# Patient Record
Sex: Female | Born: 1975 | Race: Black or African American | Hispanic: No | Smoking: Former smoker
Health system: Southern US, Community
[De-identification: ages and names within clinical notes are randomized; demographics above are authoritative.]

## PROBLEM LIST (undated history)

## (undated) DIAGNOSIS — Z8619 Personal history of other infectious and parasitic diseases: Secondary | ICD-10-CM

## (undated) DIAGNOSIS — F419 Anxiety disorder, unspecified: Secondary | ICD-10-CM

## (undated) DIAGNOSIS — Z720 Tobacco use: Secondary | ICD-10-CM

## (undated) DIAGNOSIS — M1990 Unspecified osteoarthritis, unspecified site: Secondary | ICD-10-CM

## (undated) DIAGNOSIS — G932 Benign intracranial hypertension: Secondary | ICD-10-CM

## (undated) DIAGNOSIS — R569 Unspecified convulsions: Secondary | ICD-10-CM

## (undated) HISTORY — DX: Morbid (severe) obesity due to excess calories: E66.01

## (undated) HISTORY — DX: Anxiety disorder, unspecified: F41.9

## (undated) HISTORY — PX: BREAST BIOPSY: SHX20

## (undated) HISTORY — DX: Benign intracranial hypertension: G93.2

## (undated) HISTORY — PX: OTHER SURGICAL HISTORY: SHX169

## (undated) HISTORY — DX: Unspecified osteoarthritis, unspecified site: M19.90

## (undated) HISTORY — PX: LUMBAR PUNCTURE: SHX1985

## (undated) HISTORY — DX: Tobacco use: Z72.0

## (undated) HISTORY — PX: CHOLECYSTECTOMY: SHX55

## (undated) HISTORY — DX: Personal history of other infectious and parasitic diseases: Z86.19

---

## 2009-05-31 ENCOUNTER — Emergency Department (HOSPITAL_COMMUNITY): Admission: EM | Admit: 2009-05-31 | Discharge: 2009-05-31 | Payer: Self-pay | Admitting: Emergency Medicine

## 2011-02-22 ENCOUNTER — Emergency Department (HOSPITAL_COMMUNITY)
Admission: EM | Admit: 2011-02-22 | Discharge: 2011-02-22 | Disposition: A | Discharge status: Home / Self Care | Payer: Self-pay | Attending: Emergency Medicine | Admitting: Emergency Medicine

## 2011-02-22 ENCOUNTER — Emergency Department (HOSPITAL_COMMUNITY): Payer: Self-pay

## 2011-02-22 DIAGNOSIS — M546 Pain in thoracic spine: Secondary | ICD-10-CM | POA: Insufficient documentation

## 2013-02-04 ENCOUNTER — Encounter (HOSPITAL_COMMUNITY): Payer: Self-pay | Admitting: Emergency Medicine

## 2013-02-04 ENCOUNTER — Emergency Department (HOSPITAL_COMMUNITY)
Admission: EM | Admit: 2013-02-04 | Discharge: 2013-02-04 | Disposition: A | Discharge status: Home / Self Care | Payer: BC Managed Care – PPO | Attending: Emergency Medicine | Admitting: Emergency Medicine

## 2013-02-04 DIAGNOSIS — R002 Palpitations: Secondary | ICD-10-CM | POA: Insufficient documentation

## 2013-02-04 DIAGNOSIS — F17200 Nicotine dependence, unspecified, uncomplicated: Secondary | ICD-10-CM | POA: Insufficient documentation

## 2013-02-04 LAB — BASIC METABOLIC PANEL
GFR calc Af Amer: >90 mL/min (ref >90)
GFR calc non Af Amer: 87 mL/min — ABNORMAL LOW (ref >90)
Potassium: 4.1 mEq/L (ref 3.5–5.1)
Sodium: 137 mEq/L (ref 135–145)

## 2013-02-04 LAB — CBC
Hemoglobin: 13.5 g/dL (ref 12.0–15.0)
MCHC: 34.7 g/dL (ref 30.0–36.0)
RDW: 12.5 % (ref 11.5–15.5)
WBC: 5.4 10*3/uL (ref 4.0–10.5)

## 2013-02-04 LAB — URINALYSIS, ROUTINE W REFLEX MICROSCOPIC
Leukocytes, UA: NEGATIVE
Nitrite: NEGATIVE
Specific Gravity, Urine: 1.017 (ref 1.005–1.030)
Urobilinogen, UA: 1 mg/dL (ref 0.0–1.0)

## 2013-02-04 LAB — URINE MICROSCOPIC-ADD ON

## 2013-02-04 NOTE — Progress Notes (Signed)
Patient confirms she does not have a pcp.  EDCM instructed patient to call the number on the back of her insurance card or go to insurance company website to help her find a physician who is close to her and within network.  Patient verbalized understanding and reports that she has been looking fro a pcp.  Patient thankful for information.

## 2013-02-04 NOTE — ED Notes (Signed)
Per pt has had "heart fluttering" over the past year that has became more fequent this past week. Pt reports SOB, h/a, and 1 episode of vomiting this am. Pt denies other symptoms at present time.

## 2013-02-04 NOTE — ED Provider Notes (Signed)
Medical screening examination/treatment/procedure(s) were performed by non-physician practitioner and as supervising physician I was immediately available for consultation/collaboration.   Shacara Cozine, MD 02/04/13 2300 

## 2013-02-04 NOTE — ED Provider Notes (Signed)
CSN: [id number]     Arrival date & time 02/04/13  1702 History   First MD Initiated Contact with Patient 02/04/13 1712     Chief Complaint  Patient presents with  . Palpitations   (Consider location/radiation/quality/duration/timing/severity/associated sxs/prior Treatment) Patient is a 37 y.o. female presenting with palpitations. The history is provided by the patient and medical records. No language interpreter was used.  Palpitations Associated symptoms: no back pain, no chest pain, no cough, no diaphoresis, no nausea, no shortness of breath and no vomiting     Kristina Coleman is a 37 y.o. female  with a hx of cholecystectomy presents to the Emergency Department complaining of intermittent, progressively worsening palpitations onset one year ago with increased episodes for several weeks and acute worsening today.  Pt reports beginning a new job in late July with increased stress. She reports that today there were multiple things going wrong at work and she was feeling very stressed. She states she drinks  24-48 ounces of caffeinated soda per day and lots of water.  She reports that she was evaluated for palpitations one year ago and the doctor told her there was nothing that needed to be done unless her symptoms change or increased.   Patient denies changes in skin or hair, weight loss, changes in vision, chest pain, shortness of breath, weakness, dizziness, syncope, dysuria, fever, chills, abdominal pain, nausea, vomiting.   She states that she takes deep breath palpations get better and when she feels anxious to get worse. She reports that she's never had any problems with anxiety in the past.   History reviewed. No pertinent past medical history. Past Surgical History  Procedure Laterality Date  . Galblader removal    . Left knee surgery    . Wisdom teeth removal     History reviewed. No pertinent family history. History  Substance Use Topics  . Smoking status: Current Every Day Smoker --  0.25 packs/day  . Smokeless tobacco: Never Used  . Alcohol Use: Yes     Comment: on occasion   OB History   Grav Para Term Preterm Abortions TAB SAB Ect Mult Living                 Review of Systems  Constitutional: Negative for fever, diaphoresis, appetite change, fatigue and unexpected weight change.  HENT: Negative for mouth sores.   Eyes: Negative for visual disturbance.  Respiratory: Negative for cough, chest tightness, shortness of breath and wheezing.   Cardiovascular: Positive for palpitations. Negative for chest pain.  Gastrointestinal: Negative for nausea, vomiting, abdominal pain, diarrhea and constipation.  Endocrine: Negative for polydipsia, polyphagia and polyuria.  Genitourinary: Negative for dysuria, urgency, frequency and hematuria.  Musculoskeletal: Negative for back pain and neck stiffness.  Skin: Negative for rash.  Allergic/Immunologic: Negative for immunocompromised state.  Neurological: Negative for syncope, light-headedness and headaches.  Hematological: Does not bruise/bleed easily.  Psychiatric/Behavioral: Negative for sleep disturbance. The patient is not nervous/anxious.     Allergies  Review of patient's allergies indicates no known allergies.  Home Medications  No current outpatient prescriptions on file. BP 126/105  Pulse 72  Temp(Src) 98.7 F (37.1 C) (Oral)  Resp 20  Ht 5\' 5"  (1.651 m)  Wt 275 lb (124.739 kg)  BMI 45.76 kg/m2  SpO2 100%  LMP 01/21/2013 Physical Exam  Nursing note and vitals reviewed. Constitutional: She is oriented to person, place, and time. She appears well-developed and well-nourished. No distress.  Awake, alert, nontoxic appearance  HENT:  Head: Normocephalic and atraumatic.  Right Ear: External ear normal.  Left Ear: External ear normal.  Mouth/Throat: Oropharynx is clear and moist. No oropharyngeal exudate.  Eyes: Conjunctivae and EOM are normal. Pupils are equal, round, and reactive to light. No scleral  icterus.  Neck: Normal range of motion, full passive range of motion without pain and phonation normal. Neck supple. No spinous process tenderness and no muscular tenderness present. No rigidity. Normal range of motion present. No mass and no thyromegaly present.  No palpable goiter  Cardiovascular: Normal rate, regular rhythm, S1 normal, S2 normal, normal heart sounds and intact distal pulses.   No murmur heard. Pulses:      Radial pulses are 2+ on the right side, and 2+ on the left side.       Dorsalis pedis pulses are 2+ on the right side, and 2+ on the left side.       Posterior tibial pulses are 2+ on the right side, and 2+ on the left side.  RRR  Pulmonary/Chest: Effort normal and breath sounds normal. No respiratory distress. She has no wheezes.  Abdominal: Soft. Bowel sounds are normal. She exhibits no distension. There is no tenderness. There is no rebound.  Musculoskeletal: Normal range of motion. She exhibits no edema and no tenderness.  No peripheral edema  Neurological: She is alert and oriented to person, place, and time. She exhibits normal muscle tone. Coordination normal.  Speech is clear and goal oriented Moves extremities without ataxia  Skin: Skin is warm and dry. No rash noted. She is not diaphoretic. No erythema.  Psychiatric: She has a normal mood and affect. Her behavior is normal.    ED Course  Procedures (including critical care time) Labs Review Labs Reviewed  BASIC METABOLIC PANEL - Abnormal; Notable for the following:    GFR calc non Af Amer 87 (*)    All other components within normal limits  URINALYSIS, ROUTINE W REFLEX MICROSCOPIC - Abnormal; Notable for the following:    APPearance CLOUDY (*)    Hgb urine dipstick TRACE (*)    All other components within normal limits  CBC  URINE MICROSCOPIC-ADD ON   Imaging Review No results found.  EKG Interpretation   None      ECG:  Date: 02/04/2013  Rate: 68  Rhythm: normal sinus rhythm  QRS Axis:  normal  Intervals: normal  ST/T Wave abnormalities: normal  Conduction Disutrbances:none  Narrative Interpretation: nonischemic ECG, NSR, no old for comparison   Old EKG Reviewed: none available    MDM   1. Palpitations      Kristina Coleman presents with palpitations, likely linked to anxiety. ECG nonischemic and not tachycardic at this time. Patient denies symptoms at this time.  7:23 PM Patient symptoms consistent with anxiety.  The patient is resting comfortably, in no apparent distress and asymptomatic.  Labs, ECG and vital signs reviewed.  No exophthalmos, no signs of UTI.  Stress reducing mechanisms discussed including caffeine intake.  Patient has been referred to PCP for further evaluation including testing of her thyroid.  No return of her palpitations here in the emergency department.  It has been determined that no acute conditions requiring further emergency intervention are present at this time. The patient/guardian have been advised of the diagnosis and plan. We have discussed signs and symptoms that warrant return to the ED, such as changes or worsening in symptoms.   Vital signs are stable at discharge.   BP 126/105  Pulse 72  Temp(Src) 98.7 F (37.1 C) (Oral)  Resp 20  Ht 5\' 5"  (1.651 m)  Wt 275 lb (124.739 kg)  BMI 45.76 kg/m2  SpO2 100%  LMP 01/21/2013  Patient/guardian has voiced understanding and agreed to follow-up with the PCP or specialist.          Dierdre Forth, PA-C 02/04/13 1930

## 2013-02-04 NOTE — ED Notes (Signed)
Bed: WA02 Expected date:  Expected time:  Means of arrival:  Comments: Hold for [employer]

## 2013-05-19 ENCOUNTER — Emergency Department (HOSPITAL_COMMUNITY): Payer: BC Managed Care – PPO

## 2013-05-19 ENCOUNTER — Emergency Department (HOSPITAL_COMMUNITY)
Admission: EM | Admit: 2013-05-19 | Discharge: 2013-05-19 | Disposition: A | Discharge status: Home / Self Care | Payer: BC Managed Care – PPO | Attending: Emergency Medicine | Admitting: Emergency Medicine

## 2013-05-19 DIAGNOSIS — S8000XA Contusion of unspecified knee, initial encounter: Secondary | ICD-10-CM | POA: Insufficient documentation

## 2013-05-19 DIAGNOSIS — S8002XA Contusion of left knee, initial encounter: Secondary | ICD-10-CM

## 2013-05-19 DIAGNOSIS — Y929 Unspecified place or not applicable: Secondary | ICD-10-CM | POA: Insufficient documentation

## 2013-05-19 DIAGNOSIS — F17200 Nicotine dependence, unspecified, uncomplicated: Secondary | ICD-10-CM | POA: Insufficient documentation

## 2013-05-19 DIAGNOSIS — W108XXA Fall (on) (from) other stairs and steps, initial encounter: Secondary | ICD-10-CM | POA: Insufficient documentation

## 2013-05-19 DIAGNOSIS — Y9301 Activity, walking, marching and hiking: Secondary | ICD-10-CM | POA: Insufficient documentation

## 2013-05-19 NOTE — Discharge Instructions (Signed)
Your x-ray today was negative for fracture or dislocation of your left knee. Recommend 600 mg ibuprofen every 6 hours as needed for pain control as well as compression with a knee sleeve, ice to the affected area as described below, rest, and elevation. Return if symptoms worsen.  RICE: Routine Care for Injuries The routine care of many injuries includes Rest, Ice, Compression, and Elevation (RICE). HOME CARE INSTRUCTIONS  Rest is needed to allow your body to heal. Routine activities can usually be resumed when comfortable. Injured tendons and bones can take up to 6 weeks to heal. Tendons are the cord-like structures that attach muscle to bone.  Ice following an injury helps keep the swelling down and reduces pain.  Put ice in a plastic bag.  Place a towel between your skin and the bag.  Leave the ice on for 15-20 minutes, 03-04 times a day. Do this while awake, for the first 24 to 48 hours. After that, continue as directed by your caregiver.  Compression helps keep swelling down. It also gives support and helps with discomfort. If an elastic bandage has been applied, it should be removed and reapplied every 3 to 4 hours. It should not be applied tightly, but firmly enough to keep swelling down. Watch fingers or toes for swelling, bluish discoloration, coldness, numbness, or excessive pain. If any of these problems occur, remove the bandage and reapply loosely. Contact your caregiver if these problems continue.  Elevation helps reduce swelling and decreases pain. With extremities, such as the arms, hands, legs, and feet, the injured area should be placed near or above the level of the heart, if possible. SEEK IMMEDIATE MEDICAL CARE IF:  You have persistent pain and swelling.  You develop redness, numbness, or unexpected weakness.  Your symptoms are getting worse rather than improving after several days. These symptoms may indicate that further evaluation or further X-rays are needed.  Sometimes, X-rays may not show a small broken bone (fracture) until 1 week or 10 days later. Make a follow-up appointment with your caregiver. Ask when your X-ray results will be ready. Make sure you get your X-ray results. Document Released: 07/28/2000 Document Revised: 07/08/2011 Document Reviewed: 09/14/2010 [facility] Patient Information 2014 [place], [place].

## 2013-05-19 NOTE — ED Provider Notes (Signed)
CSN: [id number]     Arrival date & time 05/19/13  1758 History   First MD Initiated Contact with Patient 05/19/13 1847     This chart was scribed for Antony MaduraKelly Yamilee Harmes, non-physician practitioner, working with Juliet RudeNathan R. Rubin PayorPickering, MD, by Ellin MayhewMichael Levi, ED Scribe. This patient was seen in room WTR6/WTR6 and the patient's care was started at 7:30 PM.  Chief Complaint  Patient presents with  . Knee Pain   Patient is a 38 y.o. female presenting with knee pain. The history is provided by the patient. No language interpreter was used.  Knee Pain Location:  Knee Time since incident:  1 day Injury: yes   Mechanism of injury: fall   Fall:    Fall occurred:  Down stairs   Impact surface:  Primary school teacherConcrete   Point of impact:  Knees Knee location:  L knee Pain details:    Quality:  Aching, throbbing and sharp   Radiates to:  Does not radiate   Severity:  Moderate   Onset quality:  Sudden   Duration:  1 day   Timing:  Constant   Progression:  Unchanged Chronicity:  New  HPI Comments: Carnella GuadalajaraLatte Boyar is a 38 y.o. female who presents to the Emergency Department complaining of L knee pain that began yesterday after falling through steps. Patient was walking up a flight of concrete stairs when the step her left leg was on top of collapsed. She did not fall but her L leg paritally fell through the stairs and she hit her L knee on the railing. She describes her pain as as a throbbing/aching pain and currently rates the severity as a 7/10. Patient further explains that her pain is worst while ambulating and not present while relaxing. She is currently ambulating with a limp. She denies having hit her head or syncopal episode. She has not had any numbness or loss of sensation, weakness. Patient has had surgery on this knee in the past and has had some trouble using it.   No past medical history on file. Past Surgical History  Procedure Laterality Date  . Galblader removal    . Left knee surgery    . Wisdom teeth  removal     No family history on file. History  Substance Use Topics  . Smoking status: Current Every Day Smoker -- 0.25 packs/day  . Smokeless tobacco: Never Used  . Alcohol Use: Yes     Comment: on occasion   OB History   Grav Para Term Preterm Abortions TAB SAB Ect Mult Living                 Review of Systems  Musculoskeletal: Negative for joint swelling.       L knee pain.  Neurological: Negative for syncope, weakness and numbness.  All other systems reviewed and are negative.   Allergies  Review of patient's allergies indicates no known allergies.  Home Medications   Current Outpatient Rx  Name  Route  Sig  Dispense  Refill  . ibuprofen (ADVIL,MOTRIN) 200 MG tablet   Oral   Take 400 mg by mouth every 6 (six) hours as needed for cramping.          Triage Vitals: BP 135/79  Pulse 78  Resp 16  SpO2 99%  LMP 05/19/2013  Physical Exam  Nursing note and vitals reviewed. Constitutional: She is oriented to person, place, and time. She appears well-developed and well-nourished. No distress.  HENT:  Head: Normocephalic and atraumatic.  Eyes:  Conjunctivae and EOM are normal. No scleral icterus.  Neck: Normal range of motion.  Cardiovascular: Normal rate, regular rhythm and intact distal pulses.   DP and PT pulses 2+ b/l  Pulmonary/Chest: Effort normal. No respiratory distress.  Musculoskeletal: Normal range of motion. She exhibits tenderness (Very mild; diffuse to left knee).  Normal range of motion of left knee. No laxity, crepitus, deformities, or effusion.  Neurological: She is alert and oriented to person, place, and time. She has normal reflexes.  No numbness, tingling or weakness of the affected extremity. Patellar and Achilles reflexes 2+ in left lower extremity. Patient ambulatory and weightbearing.  Skin: Skin is warm and dry. No rash noted. She is not diaphoretic. No erythema. No pallor.  No evidence of acute trauma.  Psychiatric: She has a normal mood  and affect. Her behavior is normal.    ED Course  Procedures (including critical care time)  DIAGNOSTIC STUDIES: Oxygen Saturation is 99% on room air, normal by my interpretation.    COORDINATION OF CARE: 7:33 PM-Reviewed negative x-ray findings and contusions to the area. Offered a sleeve for her L knee to offer stability in addition to RICE, and ibuprofen for pain control. Treatment plan discussed with patient and patient agrees.  Labs Review Labs Reviewed - No data to display Imaging Review Dg Knee Complete 4 Views Left  05/19/2013   CLINICAL DATA:  Fall.  Left knee pain.  EXAM: LEFT KNEE - COMPLETE 4+ VIEW  COMPARISON:  None.  FINDINGS: No fracture or dislocation.  There are degenerative changes with marginal spurring most prominent from the medial compartment. There is an accessory ossification center from the superior lateral patella. There is no joint effusion. The soft tissues are unremarkable.  IMPRESSION: No fracture or acute finding.   Electronically Signed   By: Amie Portland M.D.   On: 05/19/2013 18:31    EKG Interpretation   None       MDM   1. Contusion of knee, left    Uncomplicated contusion of left knee. Patient neurovascularly intact and ambulatory. No sensory or motor deficits appreciated. Reflexes normal and symmetric. X-ray negative for fracture or dislocation; no bony deformities appreciated. No evidence of septic joint. Knee sleeve applied in ED. Patient stable or discharge with RICE protocol and ibuprofen for symptoms. Return precautions provided and patient agreeable to plan with no unaddressed concerns.  I personally performed the services described in this documentation, which was scribed in my presence. The recorded information has been reviewed and is accurate.     Antony Madura, PA-C 05/19/13 1955

## 2013-05-19 NOTE — ED Notes (Signed)
Pt ambulatory to exam room with steady gait.  

## 2013-05-19 NOTE — ED Notes (Signed)
Pt reports yesterday falling through steps and hurting left knee. Pain 7/10. Able to ambulate.

## 2013-05-20 NOTE — ED Provider Notes (Signed)
Medical screening examination/treatment/procedure(s) were performed by non-physician practitioner and as supervising physician I was immediately available for consultation/collaboration.  EKG Interpretation   None        Juliet RudeNathan R. Rubin PayorPickering, MD 05/20/13 [id number]

## 2013-05-22 ENCOUNTER — Encounter (HOSPITAL_COMMUNITY): Payer: Self-pay | Admitting: Emergency Medicine

## 2013-05-22 ENCOUNTER — Emergency Department (HOSPITAL_COMMUNITY)
Admission: EM | Admit: 2013-05-22 | Discharge: 2013-05-22 | Disposition: A | Discharge status: Home / Self Care | Payer: BC Managed Care – PPO | Attending: Emergency Medicine | Admitting: Emergency Medicine

## 2013-05-22 DIAGNOSIS — E669 Obesity, unspecified: Secondary | ICD-10-CM | POA: Insufficient documentation

## 2013-05-22 DIAGNOSIS — Z9089 Acquired absence of other organs: Secondary | ICD-10-CM | POA: Insufficient documentation

## 2013-05-22 DIAGNOSIS — A084 Viral intestinal infection, unspecified: Secondary | ICD-10-CM

## 2013-05-22 DIAGNOSIS — R109 Unspecified abdominal pain: Secondary | ICD-10-CM

## 2013-05-22 DIAGNOSIS — A088 Other specified intestinal infections: Secondary | ICD-10-CM | POA: Insufficient documentation

## 2013-05-22 DIAGNOSIS — Z3202 Encounter for pregnancy test, result negative: Secondary | ICD-10-CM | POA: Insufficient documentation

## 2013-05-22 DIAGNOSIS — R112 Nausea with vomiting, unspecified: Secondary | ICD-10-CM

## 2013-05-22 DIAGNOSIS — F17200 Nicotine dependence, unspecified, uncomplicated: Secondary | ICD-10-CM | POA: Insufficient documentation

## 2013-05-22 LAB — COMPREHENSIVE METABOLIC PANEL
ALBUMIN: 4 g/dL (ref 3.5–5.2)
ALK PHOS: 82 U/L (ref 39–117)
ALT: 14 U/L (ref 0–35)
AST: 14 U/L (ref 0–37)
BILIRUBIN TOTAL: 0.4 mg/dL (ref 0.3–1.2)
BUN: 10 mg/dL (ref 6–23)
CHLORIDE: 102 meq/L (ref 96–112)
CO2: 25 mEq/L (ref 19–32)
CREATININE: 0.87 mg/dL (ref 0.50–1.10)
Calcium: 9.2 mg/dL (ref 8.4–10.5)
GFR calc non Af Amer: 85 mL/min — ABNORMAL LOW (ref >90)
GLUCOSE: 110 mg/dL — AB (ref 70–99)
POTASSIUM: 4.4 meq/L (ref 3.7–5.3)
Sodium: 140 mEq/L (ref 137–147)
TOTAL PROTEIN: 7.6 g/dL (ref 6.0–8.3)

## 2013-05-22 LAB — URINALYSIS, ROUTINE W REFLEX MICROSCOPIC
BILIRUBIN URINE: NEGATIVE
Glucose, UA: NEGATIVE mg/dL
KETONES UR: NEGATIVE mg/dL
LEUKOCYTES UA: NEGATIVE
NITRITE: NEGATIVE
PH: 5 (ref 5.0–8.0)
Protein, ur: NEGATIVE mg/dL
Specific Gravity, Urine: 1.01 (ref 1.005–1.030)
UROBILINOGEN UA: 0.2 mg/dL (ref 0.0–1.0)

## 2013-05-22 LAB — CBC WITH DIFFERENTIAL/PLATELET
BASOS PCT: 0 % (ref 0–1)
Basophils Absolute: 0 10*3/uL (ref 0.0–0.1)
EOS ABS: 0.1 10*3/uL (ref 0.0–0.7)
Eosinophils Relative: 1 % (ref 0–5)
HEMATOCRIT: 42.3 % (ref 36.0–46.0)
HEMOGLOBIN: 14.7 g/dL (ref 12.0–15.0)
LYMPHS ABS: 0.6 10*3/uL — AB (ref 0.7–4.0)
Lymphocytes Relative: 8 % — ABNORMAL LOW (ref 12–46)
MCH: 31.1 pg (ref 26.0–34.0)
MCHC: 34.8 g/dL (ref 30.0–36.0)
MCV: 89.6 fL (ref 78.0–100.0)
MONO ABS: 0.3 10*3/uL (ref 0.1–1.0)
MONOS PCT: 3 % (ref 3–12)
Neutro Abs: 6.6 10*3/uL (ref 1.7–7.7)
Neutrophils Relative %: 87 % — ABNORMAL HIGH (ref 43–77)
Platelets: 239 10*3/uL (ref 150–400)
RBC: 4.72 MIL/uL (ref 3.87–5.11)
RDW: 12.6 % (ref 11.5–15.5)
WBC: 7.6 10*3/uL (ref 4.0–10.5)

## 2013-05-22 LAB — URINE MICROSCOPIC-ADD ON

## 2013-05-22 LAB — POCT PREGNANCY, URINE: Preg Test, Ur: NEGATIVE

## 2013-05-22 LAB — LIPASE, BLOOD: LIPASE: 19 U/L (ref 11–59)

## 2013-05-22 NOTE — ED Provider Notes (Signed)
CSN: [id number]     Arrival date & time 05/22/13  1346 History   First MD Initiated Contact with Patient 05/22/13 1624     Chief Complaint  Patient presents with  . Abdominal Pain  . Nausea  . Emesis  . Diarrhea   (Consider location/radiation/quality/duration/timing/severity/associated sxs/prior Treatment) HPI Comments: Patient is a 38 year old female with a past medical history of cholecystectomy who presents to the emergency department complaining of lower abdominal pain x1 day. Patient states when she woke up this morning she had lower abdominal pain described as cramping and sensation that she has to move her bowels, had a loose bowel movement but not diarrhea. She then went to work, began to feel nauseated and had a few episodes of vomiting. Lower abdominal pain returned and she currently has the sensation that she has to move her bowels, however is embarrassed move her bowels in the emergency department. She had a normal appetite yesterday. Admits to cold chills, denies fever. Last menstrual period began last week and was normal. Denies increased urinary frequency, urgency, dysuria or vaginal bleeding. Her roommate was sick a few days ago with similar symptoms. Since being in the emergency department, patient states her symptoms have greatly improved.  Patient is a 38 y.o. female presenting with abdominal pain, vomiting, and diarrhea. The history is provided by the patient.  Abdominal Pain Associated symptoms: chills, diarrhea, nausea and vomiting   Emesis Associated symptoms: abdominal pain, chills and diarrhea   Diarrhea Associated symptoms: abdominal pain, chills and vomiting     History reviewed. No pertinent past medical history. Past Surgical History  Procedure Laterality Date  . Galblader removal    . Left knee surgery    . Wisdom teeth removal     No family history on file. History  Substance Use Topics  . Smoking status: Current Every Day Smoker -- 0.25 packs/day  .  Smokeless tobacco: Never Used  . Alcohol Use: Yes     Comment: on occasion   OB History   Grav Para Term Preterm Abortions TAB SAB Ect Mult Living                 Review of Systems  Constitutional: Positive for chills.  Gastrointestinal: Positive for nausea, vomiting, abdominal pain and diarrhea.  All other systems reviewed and are negative.    Allergies  Review of patient's allergies indicates no known allergies.  Home Medications   Current Outpatient Rx  Name  Route  Sig  Dispense  Refill  . ibuprofen (ADVIL,MOTRIN) 200 MG tablet   Oral   Take 400 mg by mouth every 6 (six) hours as needed for cramping.          BP 133/84  Pulse 86  Temp(Src) 98.7 F (37.1 C) (Oral)  Resp 18  SpO2 96%  LMP 05/19/2013 Physical Exam  Nursing note and vitals reviewed. Constitutional: She is oriented to person, place, and time. She appears well-developed and well-nourished. No distress.  Obese  HENT:  Head: Normocephalic and atraumatic.  Mouth/Throat: Oropharynx is clear and moist.  Eyes: Conjunctivae are normal. No scleral icterus.  Neck: Normal range of motion. Neck supple.  Cardiovascular: Normal rate, regular rhythm and normal heart sounds.   Pulmonary/Chest: Effort normal and breath sounds normal.  Abdominal: Soft. Normal appearance and bowel sounds are normal. She exhibits no mass. There is no tenderness. There is no rigidity, no rebound and no guarding.  Palpation of abdomen makes her feel as if she needs to move  her bowels. No tenderness.  Musculoskeletal: Normal range of motion. She exhibits no edema.  Neurological: She is alert and oriented to person, place, and time.  Skin: Skin is warm and dry. She is not diaphoretic.  Psychiatric: She has a normal mood and affect. Her behavior is normal.    ED Course  Procedures (including critical care time) Labs Review Labs Reviewed  CBC WITH DIFFERENTIAL - Abnormal; Notable for the following:    Neutrophils Relative % 87 (*)     Lymphocytes Relative 8 (*)    Lymphs Abs 0.6 (*)    All other components within normal limits  COMPREHENSIVE METABOLIC PANEL - Abnormal; Notable for the following:    Glucose, Bld 110 (*)    GFR calc non Af Amer 85 (*)    All other components within normal limits  URINALYSIS, ROUTINE W REFLEX MICROSCOPIC - Abnormal; Notable for the following:    APPearance CLOUDY (*)    Hgb urine dipstick TRACE (*)    All other components within normal limits  URINE MICROSCOPIC-ADD ON - Abnormal; Notable for the following:    Squamous Epithelial / LPF MANY (*)    All other components within normal limits  LIPASE, BLOOD  POCT PREGNANCY, URINE   Imaging Review No results found.  EKG Interpretation   None       MDM   1. Viral gastroenteritis   2. Abdominal pain   3. Nausea and vomiting    Patient is nontoxic, nonseptic appearing, in no apparent distress. Normal VS. Abdomen is soft, nontender, no peritoneal signs. She has not vomited since arriving to the emergency department. She is requesting note for work. Tolerating fluids in the emergency department without difficulty. Labs obtained triage prior to pt being seen, no acute findings. She is stable for discharge. Return precautions given. Patient states understanding of treatment care plan and is agreeable.       Trevor Mace, PA-C 05/22/13 1713

## 2013-05-22 NOTE — ED Notes (Signed)
Pt c/o mid abd pain, NVD x 1 day.  Also states she was here the other day for knee pain and states that it is not any better.

## 2013-05-22 NOTE — ED Provider Notes (Signed)
Medical screening examination/treatment/procedure(s) were performed by non-physician practitioner and as supervising physician I was immediately available for consultation/collaboration.  EKG Interpretation   None        Ethelda ChickMartha K Linker, MD 05/22/13 1714

## 2013-05-22 NOTE — Discharge Instructions (Signed)
Viral Gastroenteritis Viral gastroenteritis is also known as stomach flu. This condition affects the stomach and intestinal tract. It can cause sudden diarrhea and vomiting. The illness typically lasts 3 to 8 days. Most people develop an immune response that eventually gets rid of the virus. While this natural response develops, the virus can make you quite ill. CAUSES  Many different viruses can cause gastroenteritis, such as rotavirus or noroviruses. You can catch one of these viruses by consuming contaminated food or water. You may also catch a virus by sharing utensils or other personal items with an infected person or by touching a contaminated surface. SYMPTOMS  The most common symptoms are diarrhea and vomiting. These problems can cause a severe loss of body fluids (dehydration) and a body salt (electrolyte) imbalance. Other symptoms may include:  Fever.  Headache.  Fatigue.  Abdominal pain. DIAGNOSIS  Your caregiver can usually diagnose viral gastroenteritis based on your symptoms and a physical exam. A stool sample may also be taken to test for the presence of viruses or other infections. TREATMENT  This illness typically goes away on its own. Treatments are aimed at rehydration. The most serious cases of viral gastroenteritis involve vomiting so severely that you are not able to keep fluids down. In these cases, fluids must be given through an intravenous line (IV). HOME CARE INSTRUCTIONS   Drink enough fluids to keep your urine clear or pale yellow. Drink small amounts of fluids frequently and increase the amounts as tolerated.  Ask your caregiver for specific rehydration instructions.  Avoid:  Foods high in sugar.  Alcohol.  Carbonated drinks.  Tobacco.  Juice.  Caffeine drinks.  Extremely hot or cold fluids.  Fatty, greasy foods.  Too much intake of anything at one time.  Dairy products until 24 to 48 hours after diarrhea stops.  You may consume probiotics.  Probiotics are active cultures of beneficial bacteria. They may lessen the amount and number of diarrheal stools in adults. Probiotics can be found in yogurt with active cultures and in supplements.  Wash your hands well to avoid spreading the virus.  Only take over-the-counter or prescription medicines for pain, discomfort, or fever as directed by your caregiver. Do not give aspirin to children. Antidiarrheal medicines are not recommended.  Ask your caregiver if you should continue to take your regular prescribed and over-the-counter medicines.  Keep all follow-up appointments as directed by your caregiver. SEEK IMMEDIATE MEDICAL CARE IF:   You are unable to keep fluids down.  You do not urinate at least once every 6 to 8 hours.  You develop shortness of breath.  You notice blood in your stool or vomit. This may look like coffee grounds.  You have abdominal pain that increases or is concentrated in one small area (localized).  You have persistent vomiting or diarrhea.  You have a fever.  The patient is a child younger than 3 months, and he or she has a fever.  The patient is a child older than 3 months, and he or she has a fever and persistent symptoms.  The patient is a child older than 3 months, and he or she has a fever and symptoms suddenly get worse.  The patient is a baby, and he or she has no tears when crying. MAKE SURE YOU:   Understand these instructions.  Will watch your condition.  Will get help right away if you are not doing well or get worse. Document Released: 04/15/2005 Document Revised: 07/08/2011 Document Reviewed: 01/30/2011   ExitCare Patient Information 2014 [employer].  

## 2015-11-28 DIAGNOSIS — R569 Unspecified convulsions: Secondary | ICD-10-CM

## 2015-11-28 DIAGNOSIS — G40001 Localization-related (focal) (partial) idiopathic epilepsy and epileptic syndromes with seizures of localized onset, not intractable, with status epilepticus: Secondary | ICD-10-CM | POA: Insufficient documentation

## 2015-11-28 HISTORY — DX: Unspecified convulsions: R56.9

## 2015-12-19 ENCOUNTER — Encounter (HOSPITAL_COMMUNITY): Payer: Self-pay | Admitting: Emergency Medicine

## 2015-12-19 ENCOUNTER — Emergency Department (HOSPITAL_COMMUNITY)
Admission: EM | Admit: 2015-12-19 | Discharge: 2015-12-19 | Disposition: A | Discharge status: Home / Self Care | Payer: Self-pay | Attending: Emergency Medicine | Admitting: Emergency Medicine

## 2015-12-19 ENCOUNTER — Emergency Department (HOSPITAL_COMMUNITY): Payer: Self-pay

## 2015-12-19 DIAGNOSIS — F17200 Nicotine dependence, unspecified, uncomplicated: Secondary | ICD-10-CM | POA: Insufficient documentation

## 2015-12-19 DIAGNOSIS — R569 Unspecified convulsions: Secondary | ICD-10-CM | POA: Insufficient documentation

## 2015-12-19 LAB — CBC WITH DIFFERENTIAL/PLATELET
BASOS ABS: 0 10*3/uL (ref 0.0–0.1)
BASOS PCT: 0 %
EOS ABS: 0.1 10*3/uL (ref 0.0–0.7)
Eosinophils Relative: 2 %
HEMATOCRIT: 42.1 % (ref 36.0–46.0)
HEMOGLOBIN: 14.3 g/dL (ref 12.0–15.0)
Lymphocytes Relative: 30 %
Lymphs Abs: 1.1 10*3/uL (ref 0.7–4.0)
MCH: 31 pg (ref 26.0–34.0)
MCHC: 34 g/dL (ref 30.0–36.0)
MCV: 91.3 fL (ref 78.0–100.0)
Monocytes Absolute: 0.2 10*3/uL (ref 0.1–1.0)
Monocytes Relative: 6 %
NEUTROS ABS: 2.3 10*3/uL (ref 1.7–7.7)
NEUTROS PCT: 62 %
Platelets: 211 10*3/uL (ref 150–400)
RBC: 4.61 MIL/uL (ref 3.87–5.11)
RDW: 12.7 % (ref 11.5–15.5)
WBC: 3.7 10*3/uL — AB (ref 4.0–10.5)

## 2015-12-19 LAB — URINALYSIS, ROUTINE W REFLEX MICROSCOPIC
Bilirubin Urine: NEGATIVE
Glucose, UA: NEGATIVE mg/dL
Ketones, ur: NEGATIVE mg/dL
NITRITE: NEGATIVE
PH: 5 (ref 5.0–8.0)
Protein, ur: 100 mg/dL — AB
SPECIFIC GRAVITY, URINE: 1.029 (ref 1.005–1.030)

## 2015-12-19 LAB — COMPREHENSIVE METABOLIC PANEL
ALBUMIN: 3.7 g/dL (ref 3.5–5.0)
ALK PHOS: 58 U/L (ref 38–126)
ALT: 16 U/L (ref 14–54)
ANION GAP: 8 (ref 5–15)
AST: 17 U/L (ref 15–41)
BILIRUBIN TOTAL: 0.7 mg/dL (ref 0.3–1.2)
BUN: 8 mg/dL (ref 6–20)
CALCIUM: 8.9 mg/dL (ref 8.9–10.3)
CO2: 21 mmol/L — AB (ref 22–32)
Chloride: 108 mmol/L (ref 101–111)
Creatinine, Ser: 0.9 mg/dL (ref 0.44–1.00)
GFR calc Af Amer: >60 mL/min (ref >60)
GFR calc non Af Amer: >60 mL/min (ref >60)
GLUCOSE: 139 mg/dL — AB (ref 65–99)
Potassium: 4 mmol/L (ref 3.5–5.1)
SODIUM: 137 mmol/L (ref 135–145)
Total Protein: 6.3 g/dL — ABNORMAL LOW (ref 6.5–8.1)

## 2015-12-19 LAB — URINE MICROSCOPIC-ADD ON

## 2015-12-19 LAB — RAPID URINE DRUG SCREEN, HOSP PERFORMED
AMPHETAMINES: NOT DETECTED
BARBITURATES: NOT DETECTED
Benzodiazepines: NOT DETECTED
COCAINE: NOT DETECTED
OPIATES: NOT DETECTED
TETRAHYDROCANNABINOL: POSITIVE — AB

## 2015-12-19 LAB — CBG MONITORING, ED: Glucose-Capillary: 145 mg/dL — ABNORMAL HIGH (ref 65–99)

## 2015-12-19 LAB — I-STAT BETA HCG BLOOD, ED (MC, WL, AP ONLY): I-stat hCG, quantitative: <5 m[IU]/mL (ref <5)

## 2015-12-19 NOTE — ED Triage Notes (Signed)
Pt in from home [facility] with c/o seizure-like episode. Per EMS, pt was asleep when girlfriend witnessed approximate 5 min long seizure. On EMS arrival, pt was post-ictal, regained consciousness after 10 min. Pt denies seiz hx, recent trauma or drug use. Alert, oriented x 4.

## 2015-12-19 NOTE — Discharge Instructions (Signed)
You should not drive or perform any other activity that may lead to an injury to herself or others until you have been seen and cleared by a neurologist. If you have another seizure, please return to the hospital and you may need to be started on antiepileptic medication. Please get at least 8 hours of sleep at night, drink plenty of water, avoid drugs and alcohol.  To find a primary care or specialty doctor please call [phone] or [phone] to access "[place] Find a Doctor Service."  You may also go on the [facility] website at [email or web address]  There are also multiple [facility] and [facility] practices throughout the [place] that are frequently accepting new patients. You may find a clinic that is close to your home and contact them.  [facility] -  [address] [place] [phone]  [facility] in [place] (also locations in [place] and [place]) -  [address] [place] [address] [phone]  [facility] -  [address] [place] [address] [phone]

## 2015-12-19 NOTE — ED Provider Notes (Signed)
TIME SEEN: 5:45 AM  CHIEF COMPLAINT: Seizure  HPI: Pt is a 40 y.o. female with no significant past medical history who presents to the emergency department with possible seizure that occurred prior to arrival. Most of the history is obtained by her significant other. Her significant other reports that patient woke her up in the middle night with a grunt and then she states that the patient's body "locked up". She states the patient began foaming at the mouth and this lasted for approximate 60 seconds. After this episode stop she reported that the patient was snoring very loudly and then she slowly came to appeared very confused, likely postictal for approximately 10 minutes. She was incontinent of urine during this episode. No tongue biting. She has never had a seizure before. Denies any history of alcohol use, benzodiazepine use. Is not on any medications at home. Just start her menstrual cycle yesterday. Has never had seizures with a menstrual cycle before. Denies any head injury, headache, vomiting, diarrhea, cough, fever. No numbness, tingling or focal weakness. No family history of seizures. States she's been sleeping well and has not had any increased stress recently.  ROS: See HPI Constitutional: no fever  Eyes: no drainage  ENT: no runny nose   Cardiovascular:  no chest pain  Resp: no SOB  GI: no vomiting GU: no dysuria Integumentary: no rash  Allergy: no hives  Musculoskeletal: no leg swelling  Neurological: no slurred speech ROS otherwise negative  PAST MEDICAL HISTORY/PAST SURGICAL HISTORY:  History reviewed. No pertinent past medical history.  MEDICATIONS:  Prior to Admission medications   Medication Sig Start Date End Date Taking? Authorizing Provider  ibuprofen (ADVIL,MOTRIN) 200 MG tablet Take 400 mg by mouth every 6 (six) hours as needed for cramping.   Yes Historical Provider, MD    ALLERGIES:  No Known Allergies  SOCIAL HISTORY:  Social History  Substance Use  Topics  . Smoking status: Current Every Day Smoker    Packs/day: 0.25  . Smokeless tobacco: Never Used  . Alcohol use Yes     Comment: on occasion    FAMILY HISTORY: No family history on file.  EXAM: BP 149/98   Pulse 72   Temp 98.5 F (36.9 C) (Oral)   Resp 15   Ht 5\' 6"  (1.676 m)   Wt 270 lb (122.5 kg)   LMP 12/17/2015   SpO2 98%   BMI 43.58 kg/m  CONSTITUTIONAL: Alert and oriented 3 and responds appropriately to questions. Well-appearing; well-nourished, Afebrile, nontoxic, does not appear intoxicated, well-hydrated HEAD: Normocephalic EYES: Conjunctivae clear, PERRL ENT: normal nose; no rhinorrhea; moist mucous membranes NECK: Supple, no meningismus, no LAD  CARD: RRR; S1 and S2 appreciated; no murmurs, no clicks, no rubs, no gallops RESP: Normal chest excursion without splinting or tachypnea; breath sounds clear and equal bilaterally; no wheezes, no rhonchi, no rales, no hypoxia or respiratory distress, speaking full sentences ABD/GI: Normal bowel sounds; non-distended; soft, non-tender, no rebound, no guarding, no peritoneal signs BACK:  The back appears normal and is non-tender to palpation, there is no CVA tenderness EXT: Normal ROM in all joints; non-tender to palpation; no edema; normal capillary refill; no cyanosis, no calf tenderness or swelling    SKIN: Normal color for age and race; warm; no rash NEURO: Moves all extremities equally, sensation to light touch intact diffusely, cranial nerves II through XII intact PSYCH: The patient's mood and manner are appropriate. Grooming and personal hygiene are appropriate.  MEDICAL DECISION MAKING: Patient here after  a seizure. This is the first seizure the patient has ever had. She is currently neurologically intact. No injury on exam. Will obtain labs, urine, CT of her head. We'll continue to monitor patient in the ED. Doubt that this was cardiac in nature. No chest pain or shortness of breath. EKG is unremarkable.  ED  PROGRESS: 7:20 AM  Patient's labs are unremarkable. Urine shows blood and small leukocytes likely from her menstrual cycle. No other sign of infection. She is not having any urinary symptoms. Head CT is negative. Still at her neurologic baseline and no further seizure activity. Have discussed with her at length that she should not perform any activity that may lead to injury if she were to have another seizure to herself or others including driving until she has been cleared by a neurologist. Discussed this with her and her significant other and they verbalize understanding. We'll give her outpatient neurology follow-up. At this time I do not feel she needs to be started on antiepileptics for her first seizure. Discuss with her return precautions. They're comfortable with this plan.    At this time, I do not feel there is any life-threatening condition present. I have reviewed and discussed all results (EKG, imaging, lab, urine as appropriate), exam findings with patient/family. I have reviewed nursing notes and appropriate previous records.  I feel the patient is safe to be discharged home without further emergent workup and can continue workup as an outpatient. Discussed usual and customary return precautions. Patient/family verbalize understanding and are comfortable with this plan.  Outpatient follow-up has been provided. All questions have been answered.    EKG Interpretation  Date/Time:  Tuesday December 19 2015 05:22:03 EDT Ventricular Rate:  76 PR Interval:    QRS Duration: 84 QT Interval:  373 QTC Calculation: 420 R Axis:   38 Text Interpretation:  Pacemaker spikes or artifacts Sinus rhythm Low voltage, precordial leads Abnormal R-wave progression, early transition No significant change since last tracing Confirmed by Emilyanne Mcgough,  DO, Leilyn Frayre [phone]([id number]) on 12/19/2015 5:27:56 AM         Layla MawKristen N Lubertha Leite, DO 12/19/15 [id number]

## 2015-12-19 NOTE — ED Notes (Signed)
Pt taken to CT.

## 2015-12-27 ENCOUNTER — Encounter: Payer: Self-pay | Admitting: Neurology

## 2015-12-27 ENCOUNTER — Ambulatory Visit (INDEPENDENT_AMBULATORY_CARE_PROVIDER_SITE_OTHER): Payer: Self-pay | Admitting: Neurology

## 2015-12-27 VITALS — BP 132/90 | HR 80 | Resp 16 | Ht 66.0 in | Wt 294.0 lb

## 2015-12-27 DIAGNOSIS — R569 Unspecified convulsions: Secondary | ICD-10-CM

## 2015-12-27 DIAGNOSIS — G932 Benign intracranial hypertension: Secondary | ICD-10-CM | POA: Insufficient documentation

## 2015-12-27 HISTORY — DX: Benign intracranial hypertension: G93.2

## 2015-12-27 NOTE — Patient Instructions (Addendum)
Remember to drink plenty of fluid, eat healthy meals and do not skip any meals. Try to eat protein with a every meal and eat a healthy snack such as fruit or nuts in between meals. Try to keep a regular sleep-wake schedule and try to exercise daily, particularly in the form of walking, 20-30 minutes a day, if you can.   As far as your medications are concerned, I would like to suggest: Keppra 500mg  twice daily  As far as diagnostic testing: MRI brain and eeg  I would like to see you back in 3 months, sooner if we need to. Please call us with any interim questions, concerns, problems, updates or refill requests.    Our phone number is [phone]. We also have an after hours call service for urgent matters and there is a physician on-call for urgent questions. For any emergencies you know to call 911 or go to the nearest emergency room  Levetiracetam tablets What is this medicine? LEVETIRACETAM (lee ve tye RA se tam) is an antiepileptic drug. It is used with other medicines to treat certain types of seizures. This medicine may be used for other purposes; ask your health care provider or pharmacist if you have questions. What should I tell my health care provider before I take this medicine? They need to know if you have any of these conditions: -kidney disease -suicidal thoughts, plans, or attempt; a previous suicide attempt by you or a family member -an unusual or allergic reaction to levetiracetam, other medicines, foods, dyes, or preservatives -pregnant or trying to get pregnant -breast-feeding How should I use this medicine? Take this medicine by mouth with a glass of water. Follow the directions on the prescription label. Swallow the tablets whole. Do not crush or chew this medicine. You may take this medicine with or without food. Take your doses at regular intervals. Do not take your medicine more often than directed. Do not stop taking this medicine or any of your seizure medicines  unless instructed by your doctor or health care professional. Stopping your medicine suddenly can increase your seizures or their severity. A special MedGuide will be given to you by the pharmacist with each prescription and refill. Be sure to read this information carefully each time. Contact your pediatrician or health care professional regarding the use of this medication in children. While this drug may be prescribed for children as young as 76 years of age for selected conditions, precautions do apply. Overdosage: If you think you have taken too much of this medicine contact a poison control center or emergency room at once. NOTE: This medicine is only for you. Do not share this medicine with others. What if I miss a dose? If you miss a dose, take it as soon as you can. If it is almost time for your next dose, take only that dose. Do not take double or extra doses. What may interact with this medicine? This medicine may interact with the following medications: -carbamazepine -colesevelam -probenecid -sevelamer This list may not describe all possible interactions. Give your health care provider a list of all the medicines, herbs, non-prescription drugs, or dietary supplements you use. Also tell them if you smoke, drink alcohol, or use illegal drugs. Some items may interact with your medicine. What should I watch for while using this medicine? Visit your doctor or health care professional for a regular check on your progress. Wear a medical identification bracelet or chain to say you have epilepsy, and carry a card  that lists all your medications. It is important to take this medicine exactly as instructed by your health care professional. When first starting treatment, your dose may need to be adjusted. It may take weeks or months before your dose is stable. You should contact your doctor or health care professional if your seizures get worse or if you have any new types of seizures. You may get  drowsy or dizzy. Do not drive, use machinery, or do anything that needs mental alertness until you know how this medicine affects you. Do not stand or sit up quickly, especially if you are an older patient. This reduces the risk of dizzy or fainting spells. Alcohol may interfere with the effect of this medicine. Avoid alcoholic drinks. The use of this medicine may increase the chance of suicidal thoughts or actions. Pay special attention to how you are responding while on this medicine. Any worsening of mood, or thoughts of suicide or dying should be reported to your health care professional right away. Women who become pregnant while using this medicine may enroll in the [place] Antiepileptic Drug Pregnancy Registry by calling [phone]. This registry collects information about the safety of antiepileptic drug use during pregnancy. What side effects may I notice from receiving this medicine? Side effects you should report to your doctor or health care professional as soon as possible: -allergic reactions like skin rash, itching or hives, swelling of the face, lips, or tongue -breathing problems -dark urine -general ill feeling or flu-like symptoms -problems with balance, talking, walking -unusually weak or tired -worsening of mood, thoughts or actions of suicide or dying -yellowing of the eyes or skin Side effects that usually do not require medical attention (report to your doctor or health care professional if they continue or are bothersome): -diarrhea -dizzy, drowsy -headache -loss of appetite This list may not describe all possible side effects. Call your doctor for medical advice about side effects. You may report side effects to FDA at 1-800-FDA-1088. Where should I keep my medicine? Keep out of reach of children. Store at room temperature between 15 and 30 degrees C (59 and 86 degrees F). Throw away any unused medicine after the expiration date. NOTE: This sheet is a  summary. It may not cover all possible information. If you have questions about this medicine, talk to your doctor, pharmacist, or health care provider.    2016, [employer] Standard. (2013-03-09 08:42:48)

## 2015-12-27 NOTE — Progress Notes (Signed)
[facility]    Provider:  Dr Lucia Gaskins Referring Provider: Ward, Layla Maw, DO Primary Care Physician:  No primary care provider on file.  CC:  seizure  HPI:  Kristina Coleman is a 40 y.o. female here as a referral from Dr. Elesa Massed for a seizure. Past medical history of anxiety. She woke up a week ago in the ambulance and she does not remember anything. She smokes marijuana daily. She has been smoking for 20 years. No FHx of seizures. No personal history of seizures. She bit her tongue. Patient's partner is here today. 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 She has been healthy denies any significant history. Partner provides most information. At 3am patient made a noise, patient would not respond, her arms locked up, she started shaking, her whole body was shaking, her eyes were open like in a daze, she started having white foam out of her mouth, lasted 1-2 minutes, she rolled over on her back and started snoring real loud like trying to breath, she wouldn't wake up like she went back to sleep, EMS tried to wake her up, she was confused, she didn;t know what was going on and when they took her to ambulance she started mumbling something that didn't make sense. No inciting events or recent illnesses, no new medications. No jerking episodes or staring spells as a child. No inciting events. No known triggers. No other associated symptoms.   Reviewed notes, labs and imaging from outside physicians, which showed:  CT of the head 12/19/2015 showed No acute intracranial abnormalities including mass lesion or mass effect, hydrocephalus, extra-axial fluid collection, midline shift, hemorrhage, or acute infarction, large ischemic events (personally reviewed images)  12/19/2015: CBC unremarkable, CMP with CO2 21 and glucose 139 otherwise unremarkable, urinalysis showed blood inurine, amber color, large hgb, +THC in urine, many bacteria in the blood patient menstruating at the  time.  Reviewed records, patient was seen in 2014 in the emergency room for palpitations in the setting of stress. Palpitations improved with deep breaths and relaxation. Diagnosed with anxiety. Patient was seen in 2015 in the emergency room for knee pain after falling up a flight of concrete steps and collapsing. Her leg fell through the stairs and he she hit her left knee on the railing. In 2015 she reported to the emergency room with lower abdominal pain diagnosed with viral gastroenteritis. Patient was seen in the emergency room on 12/19/2015 with her significant other. Significant other reports that patient woke her up in the middle the night with the ground and then partners body "locked up", the patient became foaming at the mouth for approximately 67, after this the patient was snoring very loudly and she slowly came to appear very confused likely postictal for approximately 10 minutes, she was incontinent of urine during this episode, no tongue biting, no history of seizures, denies history of alcohol use or any medications at home. Exam was normal in the emergency room.   Review of Systems: Patient complains of symptoms per HPI as well as the following symptoms: No CP, no SOB. Pertinent negatives per HPI. All others negative.   Social History   Social History  . Marital status: Single    Spouse name: N/A  . Number of children: 0  . Years of education: college   Occupational History  . Psychiatrist     Social History Main Topics  . Smoking status: Current Every Day Smoker    Packs/day: 0.25  .  Smokeless tobacco: Never Used  . Alcohol use Yes     Comment: on occasion  . Drug use:     Types: Marijuana  . Sexual activity: Yes    Birth control/ protection: None   Other Topics Concern  . Not on file   Social History Narrative   Occasionally drinks tea     Family History  Problem Relation Age of Onset  . Diabetes Mother   . Hypertension Mother   . Diabetes Father     . Hypertension Father     Past Medical History:  Diagnosis Date  . Anxiety     Past Surgical History:  Procedure Laterality Date  . galblader removal    . left knee surgery    . wisdom teeth removal      No current outpatient prescriptions on file.   No current facility-administered medications for this visit.     Allergies as of 12/27/2015  . (No Known Allergies)    Vitals: BP 132/90   Pulse 80   Resp 16   Ht 5\' 6"  (1.676 m)   Wt 294 lb (133.4 kg)   LMP 12/17/2015   BMI 47.45 kg/m  Last Weight:  Wt Readings from Last 1 Encounters:  12/27/15 294 lb (133.4 kg)   Last Height:   Ht Readings from Last 1 Encounters:  12/27/15 5\' 6"  (1.676 m)    Physical exam: Exam: Gen: NAD, conversant, well nourised, obese, well groomed                     CV: RRR, no MRG. No Carotid Bruits. No peripheral edema, warm, nontender Eyes: Conjunctivae clear without exudates or hemorrhage  Neuro: Detailed Neurologic Exam  Speech:    Speech is normal; fluent and spontaneous with normal comprehension.  Cognition:    The patient is oriented to person, place, and time;     recent and remote memory intact;     language fluent;     normal attention, concentration,     fund of knowledge Cranial Nerves:    The pupils are equal, round, and reactive to light. The fundi are normal and spontaneous venous pulsations are present. Visual fields are full to finger confrontation. Extraocular movements are intact. Trigeminal sensation is intact and the muscles of mastication are normal. The face is symmetric. The palate elevates in the midline. Hearing intact. Voice is normal. Shoulder shrug is normal. The tongue has normal motion without fasciculations.   Coordination:    Normal finger to nose and heel to shin. Normal rapid alternating movements.   Gait:    Heel-toe and tandem gait are normal.   Motor Observation:    No asymmetry, no atrophy, and no involuntary movements noted. Tone:     Normal muscle tone.    Posture:    Posture is normal. normal erect    Strength:    Strength is V/V in the upper and lower limbs.      Sensation: intact to LT     Reflex Exam:  DTR's:    Deep tendon reflexes in the upper and lower extremities are normal bilaterally.   Toes:    The toes are downgoing bilaterally.   Clonus:    Clonus is absent.      Assessment/Plan:  40 year old with an unprovoked seizure. Given that this seizure was unprovoked and we may not be able to do a speedy workup (mri brain, eegs) due to uninsured status I do recommend AEDs  but she declines medication at this time.Discussed keppra.  Patient is unable to drive, operate heavy machinery, perform activities at heights or participate in water activities until 6 months seizure free. Discussed multiple times, patient and partner acknowledge.  Discussed Patients with epilepsy have a small risk of sudden unexpected death, a condition referred to as sudden unexpected death in epilepsy (SUDEP). SUDEP is defined specifically as the sudden, unexpected, witnessed or unwitnessed, nontraumatic and nondrowning death in patients with epilepsy with or without evidence for a seizure, and excluding documented status epilepticus, in which post mortem examination does not reveal a structural or toxicologic cause for death   MRI of the brain and routine EEG and then ambulatory EEG if necessary.  Discussed daily marijuana use and risks with this behavior I recommend stopping  F/u 6-12 weeks.   Needs a primary care physician, discussed.  Naomie DeanAntonia Shirleyann Montero, MD  [facility] [address] [place], [place] [id number]  Phone [phone] Fax [phone]

## 2016-02-29 ENCOUNTER — Other Ambulatory Visit: Payer: Self-pay

## 2016-03-04 ENCOUNTER — Encounter: Payer: Self-pay | Admitting: Neurology

## 2016-06-09 ENCOUNTER — Encounter (HOSPITAL_COMMUNITY): Payer: Self-pay | Admitting: Emergency Medicine

## 2016-06-09 ENCOUNTER — Emergency Department (HOSPITAL_COMMUNITY)
Admission: EM | Admit: 2016-06-09 | Discharge: 2016-06-09 | Disposition: A | Discharge status: Home / Self Care | Payer: Self-pay | Attending: Emergency Medicine | Admitting: Emergency Medicine

## 2016-06-09 DIAGNOSIS — R569 Unspecified convulsions: Secondary | ICD-10-CM | POA: Insufficient documentation

## 2016-06-09 DIAGNOSIS — F17200 Nicotine dependence, unspecified, uncomplicated: Secondary | ICD-10-CM | POA: Insufficient documentation

## 2016-06-09 DIAGNOSIS — Z5181 Encounter for therapeutic drug level monitoring: Secondary | ICD-10-CM | POA: Insufficient documentation

## 2016-06-09 HISTORY — DX: Unspecified convulsions: R56.9

## 2016-06-09 LAB — COMPREHENSIVE METABOLIC PANEL
ALT: 15 U/L (ref 14–54)
AST: 17 U/L (ref 15–41)
Albumin: 3.7 g/dL (ref 3.5–5.0)
Alkaline Phosphatase: 63 U/L (ref 38–126)
Anion gap: 7 (ref 5–15)
BILIRUBIN TOTAL: 0.3 mg/dL (ref 0.3–1.2)
BUN: 9 mg/dL (ref 6–20)
CHLORIDE: 108 mmol/L (ref 101–111)
CO2: 24 mmol/L (ref 22–32)
CREATININE: 0.84 mg/dL (ref 0.44–1.00)
Calcium: 8.7 mg/dL — ABNORMAL LOW (ref 8.9–10.3)
GFR calc Af Amer: >60 mL/min (ref >60)
Glucose, Bld: 116 mg/dL — ABNORMAL HIGH (ref 65–99)
Potassium: 3.9 mmol/L (ref 3.5–5.1)
Sodium: 139 mmol/L (ref 135–145)
Total Protein: 6.7 g/dL (ref 6.5–8.1)

## 2016-06-09 LAB — RAPID URINE DRUG SCREEN, HOSP PERFORMED
AMPHETAMINES: NOT DETECTED
Barbiturates: NOT DETECTED
Benzodiazepines: NOT DETECTED
Cocaine: NOT DETECTED
OPIATES: NOT DETECTED
Tetrahydrocannabinol: POSITIVE — AB

## 2016-06-09 LAB — CBC WITH DIFFERENTIAL/PLATELET
Basophils Absolute: 0 10*3/uL (ref 0.0–0.1)
Basophils Relative: 1 %
EOS ABS: 0.1 10*3/uL (ref 0.0–0.7)
EOS PCT: 3 %
HCT: 37.9 % (ref 36.0–46.0)
Hemoglobin: 13.1 g/dL (ref 12.0–15.0)
LYMPHS ABS: 1.2 10*3/uL (ref 0.7–4.0)
Lymphocytes Relative: 30 %
MCH: 30.4 pg (ref 26.0–34.0)
MCHC: 34.6 g/dL (ref 30.0–36.0)
MCV: 87.9 fL (ref 78.0–100.0)
MONO ABS: 0.1 10*3/uL (ref 0.1–1.0)
Monocytes Relative: 3 %
Neutro Abs: 2.6 10*3/uL (ref 1.7–7.7)
Neutrophils Relative %: 63 %
PLATELETS: 213 10*3/uL (ref 150–400)
RBC: 4.31 MIL/uL (ref 3.87–5.11)
RDW: 12.5 % (ref 11.5–15.5)
WBC: 4.1 10*3/uL (ref 4.0–10.5)

## 2016-06-09 LAB — POC URINE PREG, ED: PREG TEST UR: NEGATIVE

## 2016-06-09 LAB — ETHANOL: Alcohol, Ethyl (B): <5 mg/dL (ref <5)

## 2016-06-09 MED ORDER — LEVETIRACETAM 500 MG PO TABS: 500.0000 mg | ORAL_TABLET | Freq: Two times a day (BID) | ORAL | 0 refills | Status: DC

## 2016-06-09 NOTE — ED Notes (Signed)
Bed: WA03 Expected date:  Expected time:  Means of arrival:  Comments: Ems seziure

## 2016-06-09 NOTE — ED Triage Notes (Signed)
Patient here with complaints of seizure this am while in bed. Reports previous seizure one time before and was told to follow up with neuro. Neuro "couldnt find anything". States that both times including this episode she has had an increase in stress level. AAO x 4.

## 2016-06-09 NOTE — ED Notes (Signed)
Pt ambulated to Restroom with no assist

## 2016-06-09 NOTE — ED Provider Notes (Signed)
[facility]-EMERGENCY DEPT Provider Note   CSN: [id number] Arrival date & time: 06/09/16  [id number]     History   Chief Complaint Chief Complaint  Patient presents with  . Seizures    HPI Kristina Coleman is a 41 y.o. female.  HPI   41 year old female with history of anxiety and seizure presenting for evaluation of seizure. History obtained through girlfriend who is at bedside. Per girlfriend, patient had an apparent seizure early this morning while she was in bed. Girlfriend noted that patient was having convulsion, foaming at the mouth, having trouble breathing, and was disoriented for approximately 3-5 minutes. She finally came to when EMS arrived. Aside from mild headache, feeling woozy and weak she does not have any specific other significant complaint. She did hit her lip, and did urinate on herself. It was felt that the seizure activity is likely due to stress. Patient is having increased anxiety and stress due to family situation which she did not disclose. She however denies homicidal or suicidal ideation. She denies self medicating with either alcohol or drugs. She is smoker. She has one similar seizure episode 6 months ago. Was seen in the ED at that time and subsequently seen at an neurology office. She was prescribed medication but did not take it. She admits to having stress during the last seizure episode. She denies any recent sickness, medication changes, or environmental changes.  Past Medical History:  Diagnosis Date  . Anxiety   . Seizures [facility](HCC)     Patient Active Problem List   Diagnosis Date Noted  . Seizure (HCC) 12/27/2015    Past Surgical History:  Procedure Laterality Date  . galblader removal    . left knee surgery    . wisdom teeth removal      OB History    No data available       Home Medications    Prior to Admission medications   Not on File    Family History Family History  Problem Relation Age of Onset  . Diabetes Mother   . Hypertension  Mother   . Diabetes Father   . Hypertension Father     Social History Social History  Substance Use Topics  . Smoking status: Current Every Day Smoker    Packs/day: 0.25  . Smokeless tobacco: Never Used  . Alcohol use Yes     Comment: on occasion     Allergies   Patient has no known allergies.   Review of Systems Review of Systems  All other systems reviewed and are negative.    Physical Exam Updated Vital Signs BP 117/81 (BP Location: Right Arm)   Pulse 80   Temp 98.5 F (36.9 C) (Oral)   Resp 14   Ht 5\' 6"  (1.676 m)   Wt 122.5 kg   SpO2 98%   BMI 43.58 kg/m   Physical Exam  Constitutional: She is oriented to person, place, and time. She appears well-developed and well-nourished. No distress.  HENT:  Head: Atraumatic.  Small bruising noted to left lower lip on the left side. Small bruising noted to the right corner of tongue without any deep laceration.  Eyes: Conjunctivae are normal.  Neck: Neck supple.  No nuchal rigidity  Cardiovascular: Normal rate and regular rhythm.   Pulmonary/Chest: Effort normal and breath sounds normal.  Abdominal: Soft. She exhibits no distension. There is no tenderness.  Neurological: She is alert and oriented to person, place, and time.  GCS of 15. Normal speech. Moving all 4  extremities with purposeful effort.  Skin: No rash noted.  Psychiatric: She has a normal mood and affect. Her speech is normal and behavior is normal. Thought content is not paranoid. She expresses no homicidal and no suicidal ideation.  Nursing note and vitals reviewed.    ED Treatments / Results  Labs (all labs ordered are listed, but only abnormal results are displayed) Labs Reviewed  COMPREHENSIVE METABOLIC PANEL - Abnormal; Notable for the following:       Result Value   Glucose, Bld 116 (*)    Calcium 8.7 (*)    All other components within normal limits  RAPID URINE DRUG SCREEN, HOSP PERFORMED - Abnormal; Notable for the following:     Tetrahydrocannabinol POSITIVE (*)    All other components within normal limits  CBC WITH DIFFERENTIAL/PLATELET  ETHANOL  POC URINE PREG, ED    EKG  EKG Interpretation  Date/Time:  Sunday June 09 2016 08:18:09 EST Ventricular Rate:  80 PR Interval:    QRS Duration: 87 QT Interval:  372 QTC Calculation: 430 R Axis:   50 Text Interpretation:  Sinus rhythm No STEMI.  Confirmed by LONG MD, JOSHUA [phone]) on 06/09/2016 8:24:48 AM       Radiology No results found.  Procedures Procedures (including critical care time)  Medications Ordered in ED Medications - No data to display   Initial Impression / Assessment and Plan / ED Course  I have reviewed the triage vital signs and the nursing notes.  Pertinent labs & imaging results that were available during my care of the patient were reviewed by me and considered in my medical decision making (see chart for details).     BP 107/62   Pulse 70   Temp 98.5 F (36.9 C) (Oral)   Resp 12   Ht 5\' 6"  (1.676 m)   Wt 122.5 kg   SpO2 99%   BMI 43.58 kg/m    Final Clinical Impressions(s) / ED Diagnoses   Final diagnoses:  Seizure (HCC)    New Prescriptions New Prescriptions   LEVETIRACETAM (KEPPRA) 500 MG TABLET    Take 1 tablet (500 mg total) by mouth 2 (two) times daily.   8:47 AM Patient here with witnessed seizures activity while she was in bed. She did urinate on herself and did develop but no deep laceration requiring suturing. She attributed her seizure due to increased stress. Had one similar episode 6 months ago. No other triggering factors. She is currently not on any anti-seizure medication. Workup initiated.  11:10 AM Patient was last seen by a neurologist at [facility] on 12/27/2015. At that time an MRI of the brain and routine EEG may be ordered as necessary. Patient was given options of taking Keppra but patient declined at that time. Today she steps positive for marijuana use. Patient  states she has been using marijuana for more than 10 years without any problem and does not think that that is related to her seizure. Patient is agreeable to begin taking Keppra and she will follow-up closely with neurology for outpatient workup. Encouraged patient to avoid operating heavy machinery, or drive. Patient aware to return if she has another seizure episode today. Or she has any other concern.  Care discussed with DR. Laurence Aly, PA-C 06/09/16 1125    Mancel Bale, MD 06/09/16 [phone]

## 2016-06-09 NOTE — Discharge Instructions (Signed)
[facility] for further evaluation of your recurrent seizure.  Take Keppra as prescribed.  Avoid driving or operate heavy machinery unless you are cleared by your neurologist.  Return if you have any concerns.

## 2016-07-25 ENCOUNTER — Emergency Department (HOSPITAL_COMMUNITY)
Admission: EM | Admit: 2016-07-25 | Discharge: 2016-07-25 | Disposition: A | Discharge status: Home / Self Care | Payer: Self-pay | Attending: Emergency Medicine | Admitting: Emergency Medicine

## 2016-07-25 DIAGNOSIS — G40909 Epilepsy, unspecified, not intractable, without status epilepticus: Secondary | ICD-10-CM | POA: Insufficient documentation

## 2016-07-25 DIAGNOSIS — R569 Unspecified convulsions: Secondary | ICD-10-CM

## 2016-07-25 DIAGNOSIS — F17200 Nicotine dependence, unspecified, uncomplicated: Secondary | ICD-10-CM | POA: Insufficient documentation

## 2016-07-25 LAB — CBC WITH DIFFERENTIAL/PLATELET
Basophils Absolute: 0 10*3/uL (ref 0.0–0.1)
Basophils Relative: 0 %
Eosinophils Absolute: 0.1 10*3/uL (ref 0.0–0.7)
Eosinophils Relative: 2 %
HCT: 38.6 % (ref 36.0–46.0)
HEMOGLOBIN: 13.5 g/dL (ref 12.0–15.0)
LYMPHS ABS: 1.2 10*3/uL (ref 0.7–4.0)
LYMPHS PCT: 19 %
MCH: 30.4 pg (ref 26.0–34.0)
MCHC: 35 g/dL (ref 30.0–36.0)
MCV: 86.9 fL (ref 78.0–100.0)
Monocytes Absolute: 0.3 10*3/uL (ref 0.1–1.0)
Monocytes Relative: 4 %
Neutro Abs: 4.9 10*3/uL (ref 1.7–7.7)
Neutrophils Relative %: 75 %
Platelets: 211 10*3/uL (ref 150–400)
RBC: 4.44 MIL/uL (ref 3.87–5.11)
RDW: 12.8 % (ref 11.5–15.5)
WBC: 6.5 10*3/uL (ref 4.0–10.5)

## 2016-07-25 LAB — COMPREHENSIVE METABOLIC PANEL
ALBUMIN: 3.7 g/dL (ref 3.5–5.0)
ALT: 14 U/L (ref 14–54)
AST: 17 U/L (ref 15–41)
Alkaline Phosphatase: 63 U/L (ref 38–126)
Anion gap: 5 (ref 5–15)
BUN: 9 mg/dL (ref 6–20)
CHLORIDE: 108 mmol/L (ref 101–111)
CO2: 24 mmol/L (ref 22–32)
CREATININE: 0.91 mg/dL (ref 0.44–1.00)
Calcium: 8.8 mg/dL — ABNORMAL LOW (ref 8.9–10.3)
GFR calc Af Amer: >60 mL/min (ref >60)
GFR calc non Af Amer: >60 mL/min (ref >60)
GLUCOSE: 140 mg/dL — AB (ref 65–99)
POTASSIUM: 4.3 mmol/L (ref 3.5–5.1)
SODIUM: 137 mmol/L (ref 135–145)
Total Bilirubin: 0.8 mg/dL (ref 0.3–1.2)
Total Protein: 6.9 g/dL (ref 6.5–8.1)

## 2016-07-25 LAB — ETHANOL: Alcohol, Ethyl (B): <5 mg/dL (ref <5)

## 2016-07-25 LAB — CBG MONITORING, ED: GLUCOSE-CAPILLARY: 150 mg/dL — AB (ref 65–99)

## 2016-07-25 MED ORDER — LEVETIRACETAM 500 MG PO TABS
500.0000 mg | ORAL_TABLET | Freq: Once | ORAL | Status: AC
  Administered 2016-07-25: 500 mg via ORAL
  Filled 2016-07-25: qty 1

## 2016-07-25 MED ORDER — LORAZEPAM 1 MG PO TABS
1.0000 mg | ORAL_TABLET | Freq: Once | ORAL | Status: AC
  Administered 2016-07-25: 1 mg via ORAL
  Filled 2016-07-25: qty 1

## 2016-07-25 MED ORDER — LEVETIRACETAM 500 MG PO TABS
500.0000 mg | ORAL_TABLET | Freq: Two times a day (BID) | ORAL | 0 refills | Status: DC
Start: 2016-07-25 — End: 2016-09-09

## 2016-07-25 NOTE — ED Provider Notes (Signed)
[facility]-EMERGENCY DEPT Provider Note   CSN: [id number] Arrival date & time: 07/25/16  0806     History   Chief Complaint Chief Complaint  Patient presents with  . Seizures    HPI Kristina Coleman is a 41 y.o. female presenting with seizure like activity x 2 Witnessed by her girlfriend prior to arrival. She does not recall the event and her girlfriend is providing history. She states that the patient was standing holding her phone when she had a blank stare and wasn't responding. After having her sit down, she then proceeded to have bilateral contractions and stiffening of her entire body. This lasted for approximately 1-2 minutes. She then appeared to return but was sleepy but responsive. She then had another episode with again with snoring respirations and sleepy state. She states that she had her on the bed and she did not fall or hit her head or injure herself in any way.  HPI  Past Medical History:  Diagnosis Date  . Anxiety   . Seizures [facility](HCC)     Patient Active Problem List   Diagnosis Date Noted  . Seizure (HCC) 12/27/2015    Past Surgical History:  Procedure Laterality Date  . galblader removal    . left knee surgery    . wisdom teeth removal      OB History    No data available       Home Medications    Prior to Admission medications   Medication Sig Start Date End Date Taking? Authorizing Provider  levETIRAcetam (KEPPRA) 500 MG tablet Take 1 tablet (500 mg total) by mouth 2 (two) times daily. 07/25/16 08/24/16  Georgiana ShoreJessica B Noheli Melder, PA-C    Family History Family History  Problem Relation Age of Onset  . Diabetes Mother   . Hypertension Mother   . Diabetes Father   . Hypertension Father     Social History Social History  Substance Use Topics  . Smoking status: Current Every Day Smoker    Packs/day: 0.25  . Smokeless tobacco: Never Used  . Alcohol use Yes     Comment: on occasion     Allergies   Patient has no known allergies.   Review of  Systems Review of Systems  Constitutional: Negative for chills and fever.  HENT: Negative for dental problem, drooling, ear pain, sore throat and trouble swallowing.        Tongue tender from bitting  Eyes: Negative for pain and visual disturbance.  Respiratory: Negative for cough, chest tightness, shortness of breath, wheezing and stridor.   Cardiovascular: Negative for chest pain and palpitations.  Gastrointestinal: Negative for abdominal distention, abdominal pain, nausea and vomiting.       No loss of bowel function  Genitourinary: Negative for difficulty urinating, dysuria, flank pain and hematuria.       No loss of bladder function  Musculoskeletal: Negative for arthralgias, back pain, gait problem, myalgias, neck pain and neck stiffness.  Skin: Negative for color change, pallor and rash.  Neurological: Positive for seizures. Negative for dizziness, syncope, facial asymmetry, speech difficulty, weakness, light-headedness and numbness.  All other systems reviewed and are negative.    Physical Exam Updated Vital Signs BP 132/82   Pulse 70   Temp 98.2 F (36.8 C) (Oral)   Resp (!) 8   SpO2 95%   Physical Exam  Constitutional: She is oriented to person, place, and time. She appears well-developed and well-nourished. No distress.  Patient is afebrile, nontoxic-appearing, sitting comfortably in bed and  somewhat labile.  HENT:  Head: Normocephalic and atraumatic.  Right Ear: External ear normal.  Left Ear: External ear normal.  Nose: Nose normal.  Mouth/Throat: Oropharynx is clear and moist. No oropharyngeal exudate.  Evidence of tongue biting to the right lateral aspect of the tongue  Eyes: Conjunctivae and EOM are normal. Pupils are equal, round, and reactive to light. Right eye exhibits no discharge. Left eye exhibits no discharge. No scleral icterus.  Neck: Normal range of motion. Neck supple. No JVD present.  Cardiovascular: Normal rate, regular rhythm, normal heart sounds  and intact distal pulses.   No murmur heard. Pulmonary/Chest: Effort normal and breath sounds normal. No respiratory distress. She has no wheezes. She has no rales. She exhibits no tenderness.  Abdominal: Soft. She exhibits no distension. There is no tenderness. There is no guarding.  Musculoskeletal: Normal range of motion. She exhibits no edema, tenderness or deformity.  Neurological: She is alert and oriented to person, place, and time. No cranial nerve deficit. She exhibits normal muscle tone. Coordination normal.  Neurologic Exam:   - Mental status: Patient is alert and cooperative. Fluent speech and words are clear. Coherent thought processes and insight is good. Patient is oriented x 4 to person, place, time and event.   - Cranial nerves:  CN III, IV, VI: pupils equally round, reactive to light both direct and conscensual and normal accommodation. Full extra-ocular movement. CN VII : muscles of facial expression intact. CN X :  midline uvula. XI strength of sternocleidomastoid and trapezius muscles 5/5, XII: tongue is midline when protruded.  - Motor: No involuntary movements. Muscle tone and bulk normal throughout. Muscle strength is 5/5 in bilateral shoulder abduction, elbow flexion and extension, wrist flexion and extension, thumb opposition, grip, hip extension, flexion, leg flexion and extension, ankle dorsiflexion and plantar flexion.   - Sensory: Proprioception, light tough sensation intact in all extremities.   - Cerebellar: rapid alternating movements and point to point movement intact in upper and lower extremities. Normal stance and gait.   Skin: Skin is warm and dry. No rash noted. She is not diaphoretic. No erythema. No pallor.  Psychiatric: She has a normal mood and affect. Her behavior is normal.  Nursing note and vitals reviewed.   ED Treatments / Results  Labs (all labs ordered are listed, but only abnormal results are displayed) Labs Reviewed  COMPREHENSIVE  METABOLIC PANEL - Abnormal; Notable for the following:       Result Value   Glucose, Bld 140 (*)    Calcium 8.8 (*)    All other components within normal limits  CBG MONITORING, ED - Abnormal; Notable for the following:    Glucose-Capillary 150 (*)    All other components within normal limits  CBC WITH DIFFERENTIAL/PLATELET  ETHANOL    EKG  EKG Interpretation  Date/Time:  Thursday July 25 2016 08:34:24 EDT Ventricular Rate:  82 PR Interval:    QRS Duration: 86 QT Interval:  393 QTC Calculation: 459 R Axis:   47 Text Interpretation:  Sinus rhythm Low voltage, precordial leads Abnormal R-wave progression, early transition No STEMI.  Confirmed by LONG MD, JOSHUA [phone]) on 07/25/2016 8:49:09 AM       Radiology No results found.  Procedures Procedures (including critical care time)  Medications Ordered in ED Medications  levETIRAcetam (KEPPRA) tablet 500 mg (500 mg Oral Given 07/25/16 0913)  LORazepam (ATIVAN) tablet 1 mg (1 mg Oral Given 07/25/16 0913)     Initial Impression / Assessment and  Plan / ED Course  I have reviewed the triage vital signs and the nursing notes.  Pertinent labs & imaging results that were available during my care of the patient were reviewed by me and considered in my medical decision making (see chart for details).    Patient presents after 2 subsequent myoclonic seizures witnessed by her girlfriend prior to arrival. She has a hx of seizures with the last being a month ago. Patient had been prescribed Keppra when she had the last 2 episodes but has not filled the prescription or been compliant with medications. She states that she wanted to be seen by the neurologist and decide if she really needed to take the medication first.  Patient has fully returned to baseline normal mental status. Normal neuro exam. Overall reassuring exam.  She was given keppra and ativan while in ED waiting for labs.  Unremarkable labs. She was comfortable and ready to  go home when reassessed.  Discussed the importance of medication compliance and follow-up I believe that after our discussion patient is now more likely to be compliant with medications and follow-up with neurology and PCP. There was a little bit of denial component.  Patient was discussed with Dr. Patria Mane who has seen patient and agrees with assessment and plan.  Discussed strict return precautions and advised to return to the emergency department if experiencing any new or worsening symptoms. Instructions were understood and patient agreed with discharge plan.   Final Clinical Impressions(s) / ED Diagnoses   Final diagnoses:  Seizures [facility])    New Prescriptions Discharge Medication List as of 07/25/2016 10:38 AM       Georgiana Shore, PA-C 07/25/16 1358    Azalia Bilis, MD 07/26/16 [phone]

## 2016-07-25 NOTE — Discharge Instructions (Signed)
As discussed, it is important that you take your seizure medication to prevent further episodes.  Follow up with neurology and your primary care provider for management. Return to the emergency department if you experience a new seizure or any new concerning symptoms.

## 2016-07-25 NOTE — ED Triage Notes (Signed)
Pt c/o seizure today 1 hour ago lasting about 2-3 minutes, followed by about 1 minute of post-ictal consciousness, followed second seizure x 1.5 minutes. Seizure preceded by aura of confusion. Pt's first seizure was August 2017, this is 3rd seizure. First two occurred during sleep, this is first while awake. No head injury.

## 2016-07-30 ENCOUNTER — Telehealth: Payer: Self-pay | Admitting: Neurology

## 2016-07-30 NOTE — Telephone Encounter (Signed)
Pt is asking for a call re: her having to go to the hospital over her seziures. Pt is uninsured and is wanting to know of any free services that may benefit her re: a sleep study and any other beneficial services.

## 2016-07-30 NOTE — Telephone Encounter (Signed)
Pt w/ seizures was seen for new pt visit by Dr. Lucia Gaskins on 12/27/15. She's been to the ED 06/09/16 and again on 07/25/16 for seizures. She no-showed her appt in Nov but has a follow-up scheduled 09/09/16.

## 2016-08-01 NOTE — Telephone Encounter (Signed)
Refill recently sent in to pharmacy for Keppra on 07/25/16. Can send in additional refill at the end of this month as long as patient keeps her May appt.

## 2016-08-01 NOTE — Telephone Encounter (Signed)
Called and spoke to Patient she relayed she does not need patient assistance help she can handle payment as far as RX.   Patient is worried that her apt is not until May and she will need a refill before then .

## 2016-08-07 NOTE — Telephone Encounter (Signed)
Called pt and scheduled appt w/ Kristina Coleman next Tues @ 7:45. Pt was started on Keppra after ED visit 07/25/16 for seizures. (She didn't have med filled after 06/09/16 ED visit and previously no-showed her EEG that was scheduled in November.) Said that she started having HAs right after starting seizure med. Then this Mon, HAs became excruciating w/ severe light sensitivity. Says that HAs have gotten some better today but she has not taken Keppra since yesterday morning. Encouraged her not to completely stop med, if possible. Agreed to try taking half dose until next wk's appt if tolerated. Until then, she will call back w/ any additional questions/concerns.

## 2016-08-07 NOTE — Telephone Encounter (Signed)
Pt called to inform that she has not take the Kepra since yesterday morning because it gives her really bad headaches.

## 2016-08-07 NOTE — Telephone Encounter (Signed)
I haven't seen her since August. Have her come back for follow up with Eber Jones thanks

## 2016-08-13 ENCOUNTER — Telehealth: Payer: Self-pay | Admitting: *Deleted

## 2016-08-13 ENCOUNTER — Ambulatory Visit: Payer: Self-pay | Admitting: Nurse Practitioner
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
## 2016-09-09 ENCOUNTER — Ambulatory Visit: Payer: Self-pay | Admitting: Neurology

## 2016-09-09 ENCOUNTER — Telehealth: Payer: Self-pay | Admitting: Neurology

## 2016-09-09 ENCOUNTER — Telehealth: Payer: Self-pay

## 2016-09-09 MED ORDER — LEVETIRACETAM 500 MG PO TABS: 500.0000 mg | ORAL_TABLET | Freq: Two times a day (BID) | ORAL | 2 refills | Status: DC

## 2016-09-09 NOTE — Telephone Encounter (Signed)
Pt no-showed her appt this morning. 

## 2016-09-09 NOTE — Telephone Encounter (Signed)
Refills e-scribed to pharmacy. Pt needs to r/s appt.

## 2016-10-18 ENCOUNTER — Ambulatory Visit (INDEPENDENT_AMBULATORY_CARE_PROVIDER_SITE_OTHER): Payer: Self-pay | Admitting: Internal Medicine

## 2016-10-18 ENCOUNTER — Encounter: Payer: Self-pay | Admitting: Internal Medicine

## 2016-10-18 VITALS — BP 126/82 | HR 70 | Resp 12 | Ht 67.0 in | Wt 287.0 lb

## 2016-10-18 DIAGNOSIS — Z83511 Family history of glaucoma: Secondary | ICD-10-CM

## 2016-10-18 DIAGNOSIS — R569 Unspecified convulsions: Secondary | ICD-10-CM

## 2016-10-18 DIAGNOSIS — R739 Hyperglycemia, unspecified: Secondary | ICD-10-CM

## 2016-10-18 MED ORDER — LEVETIRACETAM 500 MG PO TABS: ORAL_TABLET | ORAL | 11 refills | Status: DC

## 2016-10-18 NOTE — Patient Instructions (Addendum)
Drink a glass of water before every meal Drink 6-8 glasses of water daily Eat three meals daily Eat a protein and healthy fat with every meal (eggs,fish, chicken, turkey and limit red meats) Eat 5 servings of vegetables daily, mix the colors Eat 2 servings of fruit daily with skin, if skin is edible Use smaller plates Put food/utensils down as you chew and swallow each bite Eat at a table with friends/family at least once daily, no TV Do not eat in front of the TV  Can google "advance directives, [place]"  And bring up form from Secretary of State. Print and fill out Or can go to "5 wishes"  Which is also in Spanish and fill out--this costs $5--perhaps easier to use. Designate a Medical Power of Attorney to speak for you if you are unable to speak for yourself when ill or injured  

## 2016-10-18 NOTE — Progress Notes (Signed)
Subjective:    Patient ID: Kristina Coleman, female    DOB: 08/05/1975, 41 y.o.   MRN: [id number]  HPI   1.  Seizures:  Has had 6 seizures in past year, the first occurring December 19, 2015 and the last occurring 10/15/2016.  Had actually 2 seizures this past month. Has not sought medical attention since March visit for this due to cost.  All but one seizure has been witnessed.  Records and patient's girlfriend describe her staring and becoming nonresponsive and, making hard breathing noises, eventually showing tonic clonic movements, foaming at the mouth.  Has urine incontinence but not stool incontinence with these episodes.  She bites her tongue.  She is not conscious of these events. When she awakens, she is sleepy, confused,  and fatigued.  This lasts about 12 hours.   No aura or symptoms precede these. She states these generally occur the day before or of her first day of period.   One first cousin and 2 second cousins on maternal side from seizures.  suffer  No history of head injury, cerebrospinal infection, stroke. Denies drug (other than marijuana daily) or alcohol use.   Toxicology in ED was negative in past save for cannabis  She does smoke cigarettes as well. All of her visits to the hospital ED show a mildly elevated blood glucose, which she was not aware of.  CT scan of head was normal when first presented for this 11/2015. Has been unable to followup with Neurology as the cost was too much for her. Was recommended to take Keppra by Neurology, but patient declined medication at the time.   Finally started Keppra in March of 2018.  500 mg twice daily caused headaches, so decreased to 250 mg twice daily.  When could not follow up with Neurology due to cost, she just stopped taking the medication.  She was tolerating this lower dose and was seizure free during the 3 weeks she took this dose.  No outpatient prescriptions have been marked as taking for the 10/18/16 encounter (Office  Visit) with Julieanne MansonMulberry, Ger Ringenberg, MD.    No Known Allergies   Past Medical History:  Diagnosis Date  . Anxiety   . Morbid obesity (HCC)   . Seizures (HCC) 11/2015    Past Surgical History:  Procedure Laterality Date  . galblader removal    . left knee surgery    . wisdom teeth removal      Family History  Problem Relation Age of Onset  . Diabetes Mother   . Hypertension Mother   . Diabetes Father   . Hypertension Father     Social History   Social History  . Marital status: Single    Spouse name: N/A  . Number of children: 0  . Years of education: college   Occupational History  . Psychiatristcomputer instructor     Social History Main Topics  . Smoking status: Current Every Day Smoker    Packs/day: 0.25  . Smokeless tobacco: Never Used  . Alcohol use Yes     Comment: on occasion  . Drug use: Yes    Types: Marijuana  . Sexual activity: Yes    Birth control/ protection: None   Other Topics Concern  . Not on file   Social History Narrative   Occasionally drinks tea      Review of Systems     Objective:   Physical Exam NAD HEENT:  PERRL, EOMI, unable to see discs well bilaterally, TMs pearly gray,  throat without injection. Neck:  Supple, No adenopathy, no thyromegaly Chest:  CTA CV:  RRR with normal S1, and S2, No S3, S4, or murmur.  Radial and DP pulses normal and equal. LE:  No edema Neuro:  A & O x 3, CN II-XII grossly intact, DTRs 2+/4 throughout, Motor is 5/5 throughout, sensory grossly normal.  Gait normal.       Assessment & Plan:  1.  Seizures:  Restart patient on Keppr/Levatiracetam 500 mg 1/2 tab twice daily. No driving. Referral to [facility]rology at [facility].  Will need EEG  2. Floaters and family history of glaucoma:  Optometry referral.  Also to get a better view of eyegrounds.  3.  Morbid obesity:  Discussed lifestyle changes.  To make small goals on a weekly basis.    4.  Hyperglycemia:  A1C Follow up in 2 months.

## 2016-10-18 NOTE — Progress Notes (Signed)
[facility] completed the new patient screener with [name]. Kristina Coleman shared that at times she feels hopeless, tired and has negative thoughts about herself due to her being stuck in her career. Kristina Coleman stated that because she does not have a degree she does not have many options and it is taking a toll on her self esteem. Kristina Coleman also expressed that she is on edge at times because she has had several seizures over the year and is unaware of the cause. Kristina Coleman will begin counseling with LCSWA.

## 2016-10-19 LAB — HGB A1C W/O EAG: Hgb A1c MFr Bld: 5.1 % (ref 4.8–5.6)

## 2016-10-22 ENCOUNTER — Other Ambulatory Visit: Payer: Self-pay | Admitting: Licensed Clinical Social Worker

## 2016-11-28 ENCOUNTER — Telehealth: Payer: Self-pay | Admitting: Internal Medicine

## 2016-11-28 NOTE — Telephone Encounter (Signed)
ERROR

## 2016-12-24 ENCOUNTER — Encounter: Payer: Self-pay | Admitting: Internal Medicine

## 2016-12-24 ENCOUNTER — Ambulatory Visit (INDEPENDENT_AMBULATORY_CARE_PROVIDER_SITE_OTHER): Payer: Self-pay | Admitting: Internal Medicine

## 2016-12-24 VITALS — BP 140/90 | HR 76 | Resp 14 | Ht 67.0 in | Wt 275.0 lb

## 2016-12-24 DIAGNOSIS — R569 Unspecified convulsions: Secondary | ICD-10-CM

## 2016-12-24 DIAGNOSIS — Z720 Tobacco use: Secondary | ICD-10-CM | POA: Insufficient documentation

## 2016-12-24 HISTORY — DX: Tobacco use: Z72.0

## 2016-12-24 NOTE — Patient Instructions (Signed)
Tobacco Cessation:   [id number] or [phone], the former for support and possibly free nicotine patches/gum and support; the latter for [facility] Smoking cessation class. Get rid of all smoking supplies:  Cigarettes, lighters, ashtrays--no stashes just in case at home if you are serious.  For Nicotine gum:  When you feel need for smoking:  Place nicotine gum in mouth and chew until juicy, then stick between gum and cheek.  You will absorb the nicotine from your mouth.   Do not aggressively chew gum. Spit gum out in garbage once you feel you are done with it.  For nicotine patches:  Stop smoking anything the day you start the first patch Start with 21 mg patch and reapply new to different area of skin every 24 hours for 30 days. Then 14 mg patch changed every 24 hours for 14 days. Then 7 mg patch changed every 24 hours for 14 days.

## 2016-12-24 NOTE — Progress Notes (Signed)
   Subjective:    Patient ID: Kristina Coleman, female    DOB: [birth year], 41 y.o.   MRN: [id number]  HPI   Seizure Disorder:  Took Keppra 250 mg once daily for 2 weeks and then stopped as made her sleepy and woozy as well as frontal headaches.  Had a seizure, which she shows me a recording of at work on 11/25/2016.  Was seated at computer desk and then started looking up and around, appears to be confused and then rolls out of chair onto ground with some tonic clonic movement and then lies on ground.  Eventually, got back up and started to fix her chair.   Total time from her odd behavior looking around to getting back up was about 38 minutes. She did have urine incontinence with this.   She never went back to the twice daily use of Keppra as she did not like the way it made her feel.   This seizure was the first one that had no relation to her menstruation cycle.   Current Meds  Medication Sig  . levETIRAcetam (KEPPRA) 500 MG tablet 1/2 tab by mouth twice daily (Patient taking differently: 1/2 tab by mouth  daily)    No Known Allergies    Review of Systems     Objective:   Physical Exam  NAD HEENT:  PERRL, EOMI, TMs pearly gray, throat without injection Neck:  Supple, No adenopathy Chest:  CTA CV:  RRR without murmur or rub, radial and DP pulses normal and equal. Neuro:  A & O x 3, CN  II-XII grossly intact, DTRs 2+/4, Motor 5/5.  Normal Gait.        Assessment & Plan:  1.  Seizure disorder:  Patient willing to go back to at least Keppra 250 mg twice daily until gets into Neurology at [facility].  Did not have an event while taking this dose previously. Not driving.  2.  Morbid Obesity:  Discussion of healthy diet and regular physical activity, especially once she gets seizures under control with regards to the latter.  3.  Tobacco Abuse:  Counseled to quit utilizing gum or patches.  Encouraged her to write a quit date on calendar and get started.  45 minutes face to face with  patient.

## 2017-01-21 ENCOUNTER — Ambulatory Visit: Payer: Self-pay | Admitting: Internal Medicine

## 2017-02-07 ENCOUNTER — Encounter: Payer: Self-pay | Admitting: Internal Medicine

## 2017-02-18 ENCOUNTER — Encounter: Payer: Self-pay | Admitting: Internal Medicine

## 2017-04-10 ENCOUNTER — Emergency Department (HOSPITAL_COMMUNITY)
Admission: EM | Admit: 2017-04-10 | Discharge: 2017-04-10 | Disposition: A | Discharge status: Home / Self Care | Payer: Self-pay | Attending: Emergency Medicine | Admitting: Emergency Medicine

## 2017-04-10 ENCOUNTER — Encounter (HOSPITAL_COMMUNITY): Payer: Self-pay | Admitting: Emergency Medicine

## 2017-04-10 ENCOUNTER — Other Ambulatory Visit: Payer: Self-pay

## 2017-04-10 DIAGNOSIS — R569 Unspecified convulsions: Secondary | ICD-10-CM | POA: Insufficient documentation

## 2017-04-10 DIAGNOSIS — Z79899 Other long term (current) drug therapy: Secondary | ICD-10-CM | POA: Insufficient documentation

## 2017-04-10 DIAGNOSIS — F17200 Nicotine dependence, unspecified, uncomplicated: Secondary | ICD-10-CM | POA: Insufficient documentation

## 2017-04-10 LAB — CBG MONITORING, ED: Glucose-Capillary: 111 mg/dL — ABNORMAL HIGH (ref 65–99)

## 2017-04-10 LAB — I-STAT BETA HCG BLOOD, ED (MC, WL, AP ONLY): I-stat hCG, quantitative: <5 m[IU]/mL (ref <5)
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
## 2017-04-10 NOTE — ED Notes (Signed)
Bed: [id number] Expected date:  Expected time:  Means of arrival:  Comments: Hold for triage seizure

## 2017-04-10 NOTE — ED Notes (Signed)
Pt verbalizes understanding of d/c paperwork, follow up instructions. Pt A/O x4, ambulatory. All belongings with patient upon departure. Unable to obtain signature, pad does not work.
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
## 2017-05-13 DIAGNOSIS — Z79899 Other long term (current) drug therapy: Secondary | ICD-10-CM | POA: Diagnosis not present

## 2017-05-13 DIAGNOSIS — R569 Unspecified convulsions: Secondary | ICD-10-CM | POA: Diagnosis not present

## 2017-05-19 ENCOUNTER — Ambulatory Visit: Payer: Self-pay | Admitting: Internal Medicine

## 2017-05-20 DIAGNOSIS — G932 Benign intracranial hypertension: Secondary | ICD-10-CM | POA: Diagnosis not present

## 2017-05-27 ENCOUNTER — Encounter: Payer: Self-pay | Admitting: Internal Medicine

## 2017-05-27 ENCOUNTER — Ambulatory Visit (INDEPENDENT_AMBULATORY_CARE_PROVIDER_SITE_OTHER): Payer: BLUE CROSS/BLUE SHIELD | Admitting: Internal Medicine

## 2017-05-27 VITALS — BP 130/90 | HR 82 | Resp 12 | Ht 67.0 in | Wt 273.0 lb

## 2017-05-27 DIAGNOSIS — Z716 Tobacco abuse counseling: Secondary | ICD-10-CM | POA: Diagnosis not present

## 2017-05-27 DIAGNOSIS — R569 Unspecified convulsions: Secondary | ICD-10-CM

## 2017-05-27 DIAGNOSIS — Z720 Tobacco use: Secondary | ICD-10-CM | POA: Diagnosis not present

## 2017-05-27 DIAGNOSIS — G932 Benign intracranial hypertension: Secondary | ICD-10-CM | POA: Diagnosis not present

## 2017-05-27 NOTE — Patient Instructions (Addendum)

## 2017-05-27 NOTE — Progress Notes (Signed)
   Subjective:    Patient ID: Carnella GuadalajaraLatte Sem, female    DOB: Apr 02, 1976, 42 y.o.   MRN: [id number]  HPI   1.  Seizure disorder:  Followed by [facility] Patrici RanksAmal Elhaj [facility].  Has been moved to Zonegran 300 mg daily.  She will be increasing to 400 mg in 2 weeks.   Started Zonegran she believes around Thanksgiving. Had recent EEG and MRI as well as LP in January.  Changes on MRI with opening pressure of 26 cm on LP support Idiopathic intracranial pressure. She has been trying to get into an [facility]ologist [facility], but unable to get in until May  Regarding seizure disorder:  Last seizure was January 14.  She states she continues to have once to twice monthly despite change in meds.  She states, however, her Zonegran is to be increased in 2 weeks.  2.  Idiopathic Intracranial Hypertension:  Recently diagnosed as above.  Reportedly, Zonegran has some diuretic properties per patient's report in a discussion with Dr. Massie BougieElhaj and may not need another medication.  She would be okay with getting an appointment with an eye [place] in [place].  Current Meds  Medication Sig  . zonisamide (ZONEGRAN) 100 MG capsule 300mg  daily for 2 weeks then increase to 400mg  daily.    No Known Allergies    Review of Systems     Objective:   Physical Exam NAD HEENT;  PERRL, EOMI, unable to see discs well.   Neck:  Supple, No adenopathy\ Chest:  CTA CV:  RRR without murmur or rub, radial pulses normal and equal.        Assessment & Plan:  1.  Idiopathic Intracranial Hypertension:  Discussed at length that weight loss very important for treatment as well as meds ultimately. Extended discussion of diet and physical activity for weight loss. Will refer to Dr. Dione BoozeGroat, Ophthalmology also for evaluation, treatment and follow up along with [facility].  2.  Seizures:  As per Dr. Massie [facility]ougieElhaj, [facility].  3.  Tobacco use/cessation counseling:  Encouraged cessation of all smoking.  Not interested in  patches.    4.  Morbid obesity: as in #1  Follow up in 3 months.

## 2017-06-03 DIAGNOSIS — G932 Benign intracranial hypertension: Secondary | ICD-10-CM | POA: Diagnosis not present

## 2017-06-18 ENCOUNTER — Encounter: Payer: Self-pay | Admitting: Internal Medicine

## 2017-06-25 ENCOUNTER — Encounter: Payer: Self-pay | Admitting: Nurse Practitioner

## 2017-06-25 ENCOUNTER — Ambulatory Visit (INDEPENDENT_AMBULATORY_CARE_PROVIDER_SITE_OTHER): Payer: BLUE CROSS/BLUE SHIELD | Admitting: Nurse Practitioner

## 2017-06-25 DIAGNOSIS — F4323 Adjustment disorder with mixed anxiety and depressed mood: Secondary | ICD-10-CM

## 2017-06-25 DIAGNOSIS — R569 Unspecified convulsions: Secondary | ICD-10-CM

## 2017-06-25 NOTE — Patient Instructions (Addendum)
Safety contract with patient: call 911 if suicidal thoughts become pervasive and/or mood worsens. She agreed.  Please visit [place] website to apply to patient assistance.  [facility] to [facility] for addition community resources: [address], [place], [place] [address]  Phone: [phone].  Go to lab for blood draw. You will be called with results.  You will be called to schedule appt with neurology and psychology.  Continue current medications till you are seen by neurology.   Living With Anxiety After being diagnosed with an anxiety disorder, you may be relieved to know why you have felt or behaved a certain way. It is natural to also feel overwhelmed about the treatment ahead and what it will mean for your life. With care and support, you can manage this condition and recover from it. How to cope with anxiety Dealing with stress Stress is your body's reaction to life changes and events, both good and bad. Stress can last just a few hours or it can be ongoing. Stress can play a major role in anxiety, so it is important to learn both how to cope with stress and how to think about it differently. Talk with your health care provider or a counselor to learn more about stress reduction. He or she may suggest some stress reduction techniques, such as:  Music therapy. This can include creating or listening to music that you enjoy and that inspires you.  Mindfulness-based meditation. This involves being aware of your normal breaths, rather than trying to control your breathing. It can be done while sitting or walking.  Centering prayer. This is a kind of meditation that involves focusing on a word, phrase, or sacred image that is meaningful to you and that brings you peace.  Deep breathing. To do this, expand your stomach and inhale slowly through your nose. Hold your breath for 3-5 seconds. Then exhale slowly, allowing your stomach muscles to relax.  Self-talk. This is a  skill where you identify thought patterns that lead to anxiety reactions and correct those thoughts.  Muscle relaxation. This involves tensing muscles then relaxing them.  Choose a stress reduction technique that fits your lifestyle and personality. Stress reduction techniques take time and practice. Set aside 5-15 minutes a day to do them. Therapists can offer training in these techniques. The training may be covered by some insurance plans. Other things you can do to manage stress include:  Keeping a stress diary. This can help you learn what triggers your stress and ways to control your response.  Thinking about how you respond to certain situations. You may not be able to control everything, but you can control your reaction.  Making time for activities that help you relax, and not feeling guilty about spending your time in this way.  Therapy combined with coping and stress-reduction skills provides the best chance for successful treatment. Medicines Medicines can help ease symptoms. Medicines for anxiety include:  Anti-anxiety drugs.  Antidepressants.  Beta-blockers.  Medicines may be used as the main treatment for anxiety disorder, along with therapy, or if other treatments are not working. Medicines should be prescribed by a health care provider. Relationships Relationships can play a big part in helping you recover. Try to spend more time connecting with trusted friends and family members. Consider going to couples counseling, taking family education classes, or going to family therapy. Therapy can help you and others better understand the condition. How to recognize changes in your condition Everyone has a different  response to treatment for anxiety. Recovery from anxiety happens when symptoms decrease and stop interfering with your daily activities at home or work. This may mean that you will start to:  Have better concentration and focus.  Sleep better.  Be less  irritable.  Have more energy.  Have improved memory.  It is important to recognize when your condition is getting worse. Contact your health care provider if your symptoms interfere with home or work and you do not feel like your condition is improving. Where to find help and support: You can get help and support from these sources:  Self-help groups.  Online and [employer].  A trusted spiritual leader.  Couples counseling.  Family education classes.  Family therapy.  Follow these instructions at home:  Eat a healthy diet that includes plenty of vegetables, fruits, whole grains, low-fat dairy products, and lean protein. Do not eat a lot of foods that are high in solid fats, added sugars, or salt.  Exercise. Most adults should do the following: ? Exercise for at least 150 minutes each week. The exercise should increase your heart rate and make you sweat (moderate-intensity exercise). ? Strengthening exercises at least twice a week.  Cut down on caffeine, tobacco, alcohol, and other potentially harmful substances.  Get the right amount and quality of sleep. Most adults need 7-9 hours of sleep each night.  Make choices that simplify your life.  Take over-the-counter and prescription medicines only as told by your health care provider.  Avoid caffeine, alcohol, and certain over-the-counter cold medicines. These may make you feel worse. Ask your pharmacist which medicines to avoid.  Keep all follow-up visits as told by your health care provider. This is important. Questions to ask your health care provider  Would I benefit from therapy?  How often should I follow up with a health care provider?  How long do I need to take medicine?  Are there any long-term side effects of my medicine?  Are there any alternatives to taking medicine? Contact a health care provider if:  You have a hard time staying focused or finishing daily tasks.  You spend many hours a  day feeling worried about everyday life.  You become exhausted by worry.  You start to have headaches, feel tense, or have nausea.  You urinate more than normal.  You have diarrhea. Get help right away if:  You have a racing heart and shortness of breath.  You have thoughts of hurting yourself or others. If you ever feel like you may hurt yourself or others, or have thoughts about taking your own life, get help right away. You can go to your nearest emergency department or call:  Your local emergency services (911 in the [place]  A suicide crisis helpline, such as the National Suicide Prevention Lifeline at (626) 047-9837. This is open 24-hours a day.  Summary  Taking steps to deal with stress can help calm you.  Medicines cannot cure anxiety disorders, but they can help ease symptoms.  Family, friends, and partners can play a big part in helping you recover from an anxiety disorder. This information is not intended to replace advice given to you by your health care provider. Make sure you discuss any questions you have with your health care provider. Document Released: 04/09/2016 Document Revised: 04/09/2016 Document Reviewed: 04/09/2016 Elsevier Interactive Patient Education  [employer].

## 2017-06-25 NOTE — Progress Notes (Signed)
Subjective:  Patient ID: Kristina Coleman, female    DOB: [birth year]  Age: 42 y.o. MRN: [id number]  CC: Establish Care (est care/seizure and endocranial hypertension?consult/neurologist referal)  Depression       The patient presents with depression.  This is a recurrent problem.  The current episode started more than 1 year ago.   The onset quality is gradual.   The problem occurs daily.  The problem has been gradually worsening since onset.  Associated symptoms include helplessness, hopelessness, restlessness, sad and suicidal ideas.  Associated symptoms include no decreased concentration, no fatigue, does not have insomnia, not irritable, no decreased interest, no appetite change, no body aches, no myalgias, no headaches and no indigestion.     Exacerbated by: recurrent seizure activity, loss of job and ability to drive.  Past treatments include nothing.  Past compliance problems include medication issues and medical issues.  Risk factors include alcohol intake, a recent illness and a change in medication usage/dosage.   Past medical history includes chronic illness, recent illness, anxiety and depression.     Pertinent negatives include no thyroid problem, no suicide attempts and no head trauma. denies any abuse at home or in past. Denies presence of gun at home. States she is not interested in use of medication for her mood but will like referral to psychology.  Seizure: Onset 11/2015 Unsatisfied with current neurology management. Current use of keppra and zonegran zonegran causing nausea and constipation. Last seizure activity first week of this month per patient, reports it was witnessed by significant other (female partner). Seizure activity 2weeks after lumbar puncture. Describes as generalized muscle twitching and postictal symptoms (confusion, and fatigue) She thinks seizures might be triggered by increased stress or menstrual cycle. LMP was last week per patient but no seizure  activity.  Outpatient Medications Prior to Visit  Medication Sig Dispense Refill  . zonisamide (ZONEGRAN) 100 MG capsule 300mg  daily for 2 weeks then increase to 400mg  daily.    Marland Kitchen levETIRAcetam (KEPPRA) 500 MG tablet Take by mouth.     No facility-administered medications prior to visit.    Social History   Socioeconomic History  . Marital status: Significant Other    Spouse name: Not on file  . Number of children: 0  . Years of education: college  . Highest education level: Not on file  Social Needs  . Financial resource strain: Not on file  . Food insecurity - worry: Not on file  . Food insecurity - inability: Not on file  . Transportation needs - medical: Not on file  . Transportation needs - non-medical: Not on file  Occupational History  . Occupation: Psychiatrist   Tobacco Use  . Smoking status: Former Smoker    Packs/day: 0.25    Last attempt to quit: 06/25/2017  . Smokeless tobacco: Never Used  Substance and Sexual Activity  . Alcohol use: Yes    Comment: on occasion  . Drug use: Yes    Types: Marijuana    Comment: every day  . Sexual activity: Yes    Birth control/protection: None  Other Topics Concern  . Not on file  Social History Narrative   Occasionally drinks tea    Family History  Problem Relation Age of Onset  . Diabetes Mother   . Hypertension Mother   . Arthritis Mother   . Diabetes Father   . Hypertension Father   . Glaucoma Father   . Cataracts Father   . Cancer Sister  breast cancer   ROS See HPI  Objective:  BP 120/84   Pulse 83   Temp 98.5 F (36.9 C)   Ht 5\' 7"  (1.702 m)   Wt 270 lb (122.5 kg)   SpO2 98%   BMI 42.29 kg/m   BP Readings from Last 3 Encounters:  06/25/17 120/84  05/27/17 130/90  04/10/17 113/77    Wt Readings from Last 3 Encounters:  06/25/17 270 lb (122.5 kg)  05/27/17 273 lb (123.8 kg)  04/10/17 280 lb (127 kg)    Physical Exam  Constitutional: She is oriented to person, place, and  time. She is not irritable. No distress.  Cardiovascular: Normal rate, regular rhythm and normal heart sounds.  Pulmonary/Chest: Effort normal and breath sounds normal.  Neurological: She is alert and oriented to person, place, and time.  Skin: Skin is warm.  Psychiatric: Her mood appears anxious. Her affect is labile. Her speech is rapid and/or pressured. She is agitated. Cognition and memory are normal. She exhibits a depressed mood. She expresses suicidal ideation. She expresses no suicidal plans and no homicidal plans.  Vitals reviewed.   Lab Results  Component Value Date   WBC 6.5 07/25/2016   HGB 13.5 07/25/2016   HCT 38.6 07/25/2016   PLT 211 07/25/2016   GLUCOSE 140 (H) 07/25/2016   ALT 14 07/25/2016   AST 17 07/25/2016   NA 137 07/25/2016   K 4.3 07/25/2016   CL 108 07/25/2016   CREATININE 0.91 07/25/2016   BUN 9 07/25/2016   CO2 24 07/25/2016   HGBA1C 5.1 10/18/2016    Assessment & Plan:   Kristina Coleman was seen today for establish care.  Diagnoses and all orders for this visit:  Seizure (HCC) -     TSH -     CBC -     Magnesium -     Ambulatory referral to Neurology -     Comprehensive metabolic panel  Morbid obesity (HCC)  Adjustment disorder with mixed anxiety and depressed mood -     TSH -     CBC -     Ambulatory referral to Psychology -     Comprehensive metabolic panel   I am having Kristina Frisina "Kristina GuadalajaraLatte Blethen" maintain her zonisamide and levETIRAcetam.  No orders of the defined types were placed in this encounter.   Follow-up: Return in about 2 weeks (around 07/09/2017) for depression.  Alysia Pennaharlotte Sevanna Ballengee, NP

## 2017-06-26 ENCOUNTER — Encounter: Payer: Self-pay | Admitting: Nurse Practitioner

## 2017-06-26 LAB — CBC
HEMATOCRIT: 39.8 % (ref 36.0–46.0)
HEMOGLOBIN: 13.1 g/dL (ref 12.0–15.0)
MCHC: 32.9 g/dL (ref 30.0–36.0)
MCV: 95.9 fl (ref 78.0–100.0)
Platelets: 278 10*3/uL (ref 150.0–400.0)
RBC: 4.15 Mil/uL (ref 3.87–5.11)
RDW: 14.4 % (ref 11.5–15.5)
WBC: 3.5 10*3/uL — ABNORMAL LOW (ref 4.0–10.5)

## 2017-06-26 LAB — TSH: TSH: 1.62 u[IU]/mL (ref 0.35–4.50)

## 2017-06-26 LAB — MAGNESIUM: Magnesium: 2.3 mg/dL (ref 1.5–2.5)

## 2017-06-26 NOTE — Telephone Encounter (Signed)
FYI

## 2017-06-27 ENCOUNTER — Other Ambulatory Visit: Payer: BLUE CROSS/BLUE SHIELD

## 2017-06-27 LAB — COMPREHENSIVE METABOLIC PANEL
ALBUMIN: 3.8 g/dL (ref 3.5–5.2)
ALK PHOS: 62 U/L (ref 39–117)
ALT: 21 U/L (ref 0–35)
AST: 15 U/L (ref 0–37)
BUN: 10 mg/dL (ref 6–23)
CALCIUM: 9.7 mg/dL (ref 8.4–10.5)
CO2: 28 mEq/L (ref 19–32)
Chloride: 108 mEq/L (ref 96–112)
Creatinine, Ser: 0.91 mg/dL (ref 0.40–1.20)
GFR: 87.91 mL/min (ref >60.00)
Glucose, Bld: 92 mg/dL (ref 70–99)
Potassium: 4.7 mEq/L (ref 3.5–5.1)
SODIUM: 141 meq/L (ref 135–145)
TOTAL PROTEIN: 6.6 g/dL (ref 6.0–8.3)
Total Bilirubin: 0.4 mg/dL (ref 0.2–1.2)

## 2017-06-27 NOTE — Addendum Note (Signed)
Addended by: Varney BilesWIESNER, Caitlen Worth M on: 06/27/2017 02:05 PM   Modules accepted: Orders

## 2017-07-01 ENCOUNTER — Encounter: Payer: Self-pay | Admitting: Nurse Practitioner

## 2017-07-09 ENCOUNTER — Encounter: Payer: Self-pay | Admitting: Nurse Practitioner

## 2017-07-09 ENCOUNTER — Ambulatory Visit (INDEPENDENT_AMBULATORY_CARE_PROVIDER_SITE_OTHER): Payer: BLUE CROSS/BLUE SHIELD | Admitting: Nurse Practitioner

## 2017-07-09 VITALS — BP 114/78 | HR 78 | Temp 98.7°F | Ht 67.0 in | Wt 269.0 lb

## 2017-07-09 DIAGNOSIS — F4323 Adjustment disorder with mixed anxiety and depressed mood: Secondary | ICD-10-CM | POA: Diagnosis not present

## 2017-07-09 NOTE — Patient Instructions (Addendum)
Advised to engage in activity that gets her out of the house and removes focus on her current situation e.g volunteering.  [facility] [place]  Non-profit organization in [place]   [address], [place], [place] [address]  Phone: [phone].  Maintain appointment with neurology and psychology.  Let me know if you change your mind about taking antidepressant. We can try Wellbutrin or effexor if you are interested.  Major Depressive Disorder, Adult Major depressive disorder (MDD) is a mental health condition. MDD often makes you feel sad, hopeless, or helpless. MDD can also cause symptoms in your body. MDD can affect your:  Work.  School.  Relationships.  Other normal activities.  MDD can range from mild to very bad. It may occur once (single episode MDD). It can also occur many times (recurrent MDD). The main symptoms of MDD often include:  Feeling sad, depressed, or irritable most of the time.  Loss of interest.  MDD symptoms also include:  Sleeping too much or too little.  Eating too much or too little.  A change in your weight.  Feeling tired (fatigue) or having low energy.  Feeling worthless.  Feeling guilty.  Trouble making decisions.  Trouble thinking clearly.  Thoughts of suicide or harming others.  Feeling weak.  Feeling agitated.  Keeping yourself from being around other people (isolation).  Follow these instructions at home: Activity  Do these things as told by your doctor: ? Go back to your normal activities. ? Exercise regularly. ? Spend time outdoors. Alcohol  Talk with your doctor about how alcohol can affect your antidepressant medicines.  Do not drink alcohol. Or, limit how much alcohol you drink. ? This means no more than 1 drink a day for nonpregnant women and 2 drinks a day for men. One drink equals one of these:  12 oz of beer.  5 oz of wine.  1 oz of hard liquor. General  instructions  Take over-the-counter and prescription medicines only as told by your doctor.  Eat a healthy diet.  Get plenty of sleep.  Find activities that you enjoy. Make time to do them.  Think about joining a support group. Your doctor may be able to suggest a group for you.  Keep all follow-up visits as told by your doctor. This is important. Where to find more information:  The [employer] on Mental Illness: ? www.nami.org  U.[employer] Institute of Mental Health: ? http://www.maynard.net/www.nimh.nih.gov  National Suicide Prevention Lifeline: ? (332)389-05931-(573)418-1006. This is free, 24-hour help. Contact a doctor if:  Your symptoms get worse.  You have new symptoms. Get help right away if:  You self-harm.  You see, hear, taste, smell, or feel things that are not present (hallucinate). If you ever feel like you may hurt yourself or others, or have thoughts about taking your own life, get help right away. You can go to your nearest emergency department or call:  Your local emergency services (911 in the [place]  A suicide crisis helpline, such as the National Suicide Prevention Lifeline: ? 425 788 53941-(573)418-1006. This is open 24 hours a day.  This information is not intended to replace advice given to you by your health care provider. Make sure you discuss any questions you have with your health care provider. Document Released: 03/27/2015 Document Revised: 12/31/2015 Document Reviewed: 12/31/2015 Elsevier Interactive Patient Education  2017 [employer]

## 2017-07-09 NOTE — Assessment & Plan Note (Addendum)
Entered referral to psychology. Spent 20% discussing the benefit of antidepressant. Also advised to engage in group activities that remove the focus from her current situation (volunteering). She declined medication but agreed to go for counseling and to find a Designer, fashion/clothingvolunteer opportunity.

## 2017-07-09 NOTE — Progress Notes (Signed)
   Subjective:  Patient ID: Kristina Coleman, female    DOB: 1975-07-31  Age: 42 y.o. MRN: [id number]  CC: Follow-up (F/u for depression. states no seizures)   Depression         This is a chronic problem.  The onset quality is gradual.   The problem occurs daily.  The problem has been waxing and waning since onset.  Associated symptoms include decreased concentration, fatigue, helplessness, decreased interest, appetite change, body aches and sad.  Associated symptoms include no hopelessness, does not have insomnia, not irritable, no restlessness, no myalgias, no headaches, no indigestion and no suicidal ideas.     The symptoms are aggravated by social issues (health).  Past treatments include nothing. states she is not interested in taking any medication at this time. She has appt with psychology 07/2017  appt with nerology 07/2017. Denies any seizure activity since last OV with me.  Reports she has applied been to social service and is waiting for call back.  Outpatient Medications Prior to Visit  Medication Sig Dispense Refill  . zonisamide (ZONEGRAN) 100 MG capsule 300mg  daily for 2 weeks then increase to 400mg  daily.    Marland Kitchen. levETIRAcetam (KEPPRA) 500 MG tablet Take by mouth.     No facility-administered medications prior to visit.     ROS See HPI  Objective:  BP 114/78   Pulse 78   Temp 98.7 F (37.1 C)   Ht 5\' 7"  (1.702 m)   Wt 269 lb (122 kg)   SpO2 99%   BMI 42.13 kg/m   BP Readings from Last 3 Encounters:  07/09/17 114/78  06/25/17 120/84  05/27/17 130/90    Wt Readings from Last 3 Encounters:  07/09/17 269 lb (122 kg)  06/25/17 270 lb (122.5 kg)  05/27/17 273 lb (123.8 kg)    Physical Exam  Constitutional: She is oriented to person, place, and time. She is not irritable. No distress.  Cardiovascular: Normal rate.  Neurological: She is alert and oriented to person, place, and time.  Psychiatric: Her affect is labile. She is slowed. Cognition and memory are  normal. She exhibits a depressed mood. She expresses no suicidal ideation.  Slowed speech. Intermittent crying during visit.  Vitals reviewed.   Lab Results  Component Value Date   WBC 3.5 (L) 06/25/2017   HGB 13.1 06/25/2017   HCT 39.8 06/25/2017   PLT 278.0 06/25/2017   GLUCOSE 92 06/27/2017   ALT 21 06/27/2017   AST 15 06/27/2017   NA 141 06/27/2017   K 4.7 06/27/2017   CL 108 06/27/2017   CREATININE 0.91 06/27/2017   BUN 10 06/27/2017   CO2 28 06/27/2017   TSH 1.62 06/25/2017   HGBA1C 5.1 10/18/2016    Assessment & Plan:   Kristina Coleman was seen today for follow-up.  Diagnoses and all orders for this visit:  Adjustment disorder with mixed anxiety and depressed mood   I am having Kristina Coleman "Kristina GuadalajaraLatte [place]ie" maintain her zonisamide and levETIRAcetam.  No orders of the defined types were placed in this encounter.   Follow-up: Return if symptoms worsen or fail to improve.  Alysia Pennaharlotte Donnis Phaneuf, NP

## 2017-07-30 ENCOUNTER — Ambulatory Visit: Payer: BLUE CROSS/BLUE SHIELD | Admitting: Psychology

## 2017-08-14 ENCOUNTER — Encounter: Payer: Self-pay | Admitting: *Deleted

## 2017-08-14 ENCOUNTER — Ambulatory Visit (INDEPENDENT_AMBULATORY_CARE_PROVIDER_SITE_OTHER): Payer: BLUE CROSS/BLUE SHIELD | Admitting: Neurology

## 2017-08-14 ENCOUNTER — Encounter: Payer: Self-pay | Admitting: Neurology

## 2017-08-14 VITALS — BP 130/88 | HR 78 | Ht 66.0 in | Wt 265.0 lb

## 2017-08-14 DIAGNOSIS — G40009 Localization-related (focal) (partial) idiopathic epilepsy and epileptic syndromes with seizures of localized onset, not intractable, without status epilepticus: Secondary | ICD-10-CM

## 2017-08-14 MED ORDER — ZONISAMIDE 100 MG PO CAPS
300.0000 mg | ORAL_CAPSULE | Freq: Every day | ORAL | 6 refills | Status: DC
Start: 2017-08-14 — End: 2017-10-27

## 2017-08-14 NOTE — Progress Notes (Signed)
[facility]    Provider:  Dr Lucia Gaskins Referring Provider: Julieanne Manson, MD Primary Care Physician:  Anne Ng, NP  CC:  Seizure  Interval history 08/14/2017: Patient here as a new referral for seizure. PMHx morbid obesity, seizure. Was initially seen in 2017. She was seen and MRI brain, EEGs, workup negative. Whole body lockin gp, generalized convulsions, foaming, eyes fixed straight, tongue biting and incontinence. Post-ictal 2-3 minutes. Mostly octurnal. Ocurring once a month, was not compliant with medication per notes (reviewed notes from neurology). Her last seizure was 10/2016 and afterwards she became compliant with AEDs. Per notes had 2 episodes during the day, 1 was at work and captured on video sitting on the desk and had head deviation, then RUE tonic flexion followed by the left then fell to the ground and had clonic b/l activity. Blanl stares occur twice per week with unresponsiveness. Last seizure was the 30th of last month she is on Zonisamide 200mg  at night. Had an MRI and also diagnosed with IIH. She was unhappy with her last Neurologist because they were just pushing pills.  The last seizure in 03/2017 was witnessed, staring for 5 minutes then tonic-clonic movements, no urinary incontinence, no tongue biting.   Meds tried: Keppra gave her headaches and mood swings, on Zonisamide currently  HPI:  Kristina Coleman is a 42 y.o. female here as a referral from Dr. Delrae Alfred for a seizure. Past medical history of anxiety. She woke up a week ago in the ambulance and she does not remember anything. She smokes marijuana daily. She has been smoking for 20 years. No FHx of seizures. No personal history of seizures. She bit her tongue. Patient's partner is here today. 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 She has been healthy denies any significant history. Partner provides most information. At 3am patient made a noise, patient would not  respond, her arms locked up, she started shaking, her whole body was shaking, her eyes were open like in a daze, she started having white foam out of her mouth, lasted 1-2 minutes, she rolled over on her back and started snoring real loud like trying to breath, she wouldn't wake up like she went back to sleep, EMS tried to wake her up, she was confused, she didn;t know what was going on and when they took her to ambulance she started mumbling something that didn't make sense. No inciting events or recent illnesses, no new medications. No jerking episodes or staring spells as a child. No inciting events. No known triggers. No other associated symptoms.   Reviewed notes, labs and imaging from outside physicians, which showed:  CT of the head 12/19/2015 showed No acute intracranial abnormalities including mass lesion or mass effect, hydrocephalus, extra-axial fluid collection, midline shift, hemorrhage, or acute infarction, large ischemic events (personally reviewed images)  12/19/2015: CBC unremarkable, CMP with CO2 21 and glucose 139 otherwise unremarkable, urinalysis showed blood inurine, amber color, large hgb, +THC in urine, many bacteria in the blood patient menstruating at the time.  Reviewed records, patient was seen in 2014 in the emergency room for palpitations in the setting of stress. Palpitations improved with deep breaths and relaxation. Diagnosed with anxiety. Patient was seen in 2015 in the emergency room for knee pain after falling up a flight of concrete steps and collapsing. Her leg fell through the stairs and he she hit her left knee on the railing. In 2015 she reported to the emergency room with lower  abdominal pain diagnosed with viral gastroenteritis. Patient was seen in the emergency room on 12/19/2015 with her significant other. Significant other reports that patient woke her up in the middle the night with the ground and then partners body "locked up", the patient became foaming at the  mouth for approximately 67, after this the patient was snoring very loudly and she slowly came to appear very confused likely postictal for approximately 10 minutes, she was incontinent of urine during this episode, no tongue biting, no history of seizures, denies history of alcohol use or any medications at home. Exam was normal in the emergency room.   Review of Systems: Patient complains of symptoms per HPI as well as the following symptoms: No CP, no SOB. Pertinent negatives per HPI. All others negative.   Social History   Socioeconomic History  . Marital status: Significant Other    Spouse name: Not on file  . Number of children: 0  . Years of education: college  . Highest education level: Not on file  Occupational History  . Occupation: Psychiatrist   Social Needs  . Financial resource strain: Not on file  . Food insecurity:    Worry: Not on file    Inability: Not on file  . Transportation needs:    Medical: Not on file    Non-medical: Not on file  Tobacco Use  . Smoking status: Current Some Day Smoker    Packs/day: 0.20    Last attempt to quit: 06/25/2017    Years since quitting: 0.1  . Smokeless tobacco: Never Used  Substance and Sexual Activity  . Alcohol use: Yes    Comment: on occasion  . Drug use: Yes    Types: Marijuana    Comment: every day, she is smoking less now  . Sexual activity: Yes    Birth control/protection: None  Lifestyle  . Physical activity:    Days per week: Not on file    Minutes per session: Not on file  . Stress: Not on file  Relationships  . Social connections:    Talks on phone: Not on file    Gets together: Not on file    Attends religious service: Not on file    Active member of club or organization: Not on file    Attends meetings of clubs or organizations: Not on file    Relationship status: Not on file  . Intimate partner violence:    Fear of current or ex partner: Not on file    Emotionally abused: Not on file     Physically abused: Not on file    Forced sexual activity: Not on file  Other Topics Concern  . Not on file  Social History Narrative   Occasionally drinks tea     Family History  Problem Relation Age of Onset  . Diabetes Mother   . Hypertension Mother   . Arthritis Mother   . Diabetes Father   . Hypertension Father   . Glaucoma Father   . Cataracts Father   . Cancer Sister        breast cancer    Past Medical History:  Diagnosis Date  . Anxiety   . History of chicken pox   . Intracranial hypertension   . Morbid obesity (HCC)   . Seizures (HCC) 11/2015  . Tobacco use 12/24/2016    Past Surgical History:  Procedure Laterality Date  . galblader removal    . left knee surgery    . LUMBAR PUNCTURE  fluid removal in brain.   Marland Kitchen. wisdom teeth removal      Current Outpatient Medications  Medication Sig Dispense Refill  . zonisamide (ZONEGRAN) 100 MG capsule Take 3 capsules (300 mg total) by mouth at bedtime. 90 capsule 6   No current facility-administered medications for this visit.     Allergies as of 08/14/2017  . (No Known Allergies)    Vitals: BP 130/88 (BP Location: Right Arm, Patient Position: Sitting)   Pulse 78   Ht 5\' 6"  (1.676 m)   Wt 265 lb (120.2 kg)   BMI 42.77 kg/m  Last Weight:  Wt Readings from Last 1 Encounters:  08/14/17 265 lb (120.2 kg)   Last Height:   Ht Readings from Last 1 Encounters:  08/14/17 5\' 6"  (1.676 m)    Physical exam: Exam: Gen: NAD, conversant, well nourised, obese, well groomed                     CV: RRR, no MRG. No Carotid Bruits. No peripheral edema, warm, nontender Eyes: Conjunctivae clear without exudates or hemorrhage  Neuro: Detailed Neurologic Exam  Speech:    Speech is normal; fluent and spontaneous with normal comprehension.  Cognition:    The patient is oriented to person, place, and time;     recent and remote memory intact;     language fluent;     normal attention, concentration,     fund of  knowledge Cranial Nerves:    The pupils are equal, round, and reactive to light. The fundi are normal and spontaneous venous pulsations are present. Visual fields are full to finger confrontation. Extraocular movements are intact. Trigeminal sensation is intact and the muscles of mastication are normal. The face is symmetric. The palate elevates in the midline. Hearing intact. Voice is normal. Shoulder shrug is normal. The tongue has normal motion without fasciculations.   Coordination:    Normal finger to nose and heel to shin. Normal rapid alternating movements.   Gait:    Heel-toe and tandem gait are normal.   Motor Observation:    No asymmetry, no atrophy, and no involuntary movements noted. Tone:    Normal muscle tone.    Posture:    Posture is normal. normal erect    Strength:    Strength is V/V in the upper and lower limbs.      Sensation: intact to LT     Reflex Exam:  DTR's:    Deep tendon reflexes in the upper and lower extremities are normal bilaterally.   Toes:    The toes are downgoing bilaterally.   Clonus:    Clonus is absent.      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 She has already seen neurology, had a workup with EEG and MRI brain, also diagnosed with IIH.    Semiology: Described by partner as : 1-Whole body locking up then has generalized convulsions, foaming, eyes fixed straight ahead. She would have tongue biting and incontinence. Lasts 2 mins. Post ictal sleep for 2-3 mins then becomes more responsive. Returns to baseline in 10-15 mins. Mostly nocturnal  Captured on video which she brings a copy of: She was sitting on the desk and had right head deviation , then RUE tonic flexion followed by the left, then fell to the ground and had clonic b/l activity. Had LOC for sometime then was struggling to get up and trying to fix the chair.    -  She could not tolerate keppra, now on zonisamide and having  difficulty tolerating with breakthrough seizures. She is tired of people "pushing pills" at her  - I think she needs to be referred to Dr. Everlena Cooper at [facility]. He is an Herbalist with capability of admitting patient into the hospital, removing medications with video monitoring to see if a seizure can be caught. Based on EEG findings may be able to to better treat with AEDs or even recommend surgical procedures for this patient.  - In the meantime she should increase her Zonisamide to 300mg  qhs which also treats the IIH  - Discussed IIH, risk of permanent vision loss, need for weight loss and follow up with ophthamology. She has no symptoms of IIH, may be an incidental finding oe elevated pressure but needs to be followed for this, consinue with ophthalmology and return here if has symtpoms.    Patient is unable to drive, operate heavy machinery, perform activities at heights or participate in water activities until 6 months seizure free. Discussed multiple times, patient and partner acknowledge.  Discussed Patients with epilepsy have a small risk of sudden unexpected death, a condition referred to as sudden unexpected death in epilepsy (SUDEP). SUDEP is defined specifically as the sudden, unexpected, witnessed or unwitnessed, nontraumatic and nondrowning death in patients with epilepsy with or without evidence for a seizure, and excluding documented status epilepticus, in which post mortem examination does not reveal a structural or toxicologic cause for death   Discussed daily marijuana use and risks with this behavior I recommend stopping  May consider changing to Lamictal which helps with mood, but prefer for Dr. Logan Bores to see her first as he may admit to the EMU inpatient  F/u Dr Everlena Cooper  Orders Placed This Encounter  Procedures  . Zonisamide level  . Ambulatory referral to Neurology   Check level today  Naomie Dean, MD  [facility] [address] [place], [place] [address]  Phone [phone] Fax [phone]

## 2017-08-14 NOTE — Patient Instructions (Signed)
Increase Zonisamide to 300mg  at bedtime  Seizure, Adult   A seizure is a sudden burst of abnormal electrical activity in the brain. The abnormal activity temporarily interrupts normal brain function, causing a person to experience any of the following:  Involuntary movements.  Changes in awareness or consciousness.  Uncontrollable shaking (convulsions).  Seizures usually last from 30 seconds to 2 minutes. They usually do not cause permanent brain damage unless they are prolonged. What can cause a seizure to happen? Seizures can happen for many reasons including:  A fever.  Low blood sugar.  A medicine.  An illnesses.  A brain injury.  Some people who have a seizure never have another one. People who have repeated seizures have a condition called epilepsy. What are the symptoms of a seizure? Symptoms of a seizure vary greatly from person to person. They include:  Convulsions.  Stiffening of the body.  Involuntary movements of the arms or legs.  Loss of consciousness.  Breathing problems.  Falling suddenly.  Confusion.  Head nodding.  Eye blinking or fluttering.  Lip smacking.  Drooling.  Rapid eye movements.  Grunting.  Loss of bladder control and bowel control.  Staring.  Unresponsiveness.  Some people have symptoms right before a seizure happens (aura) and right after a seizure happens. Symptoms of an aura include:  Fear or anxiety.  Nausea.  Feeling like the room is spinning (vertigo).  A feeling of having seen or heard something before (deja vu).  Odd tastes or smells.  Changes in vision, such as seeing flashing lights or spots.  Symptoms that may follow a seizure include:  Confusion.  Sleepiness.  Headache.  Weakness of one side of the body.  Follow these instructions at home: Medicines   Take over-the-counter and prescription medicines only as told by your health care provider.  Avoid any substances that may prevent  your medicine from working properly, such as alcohol. Activity  Do not drive, swim, or do any other activities that would be dangerous if you had another seizure. Wait until your health care provider approves.  If you live in the [place] check with your local DMV (department of motor vehicles) to find out about the local driving laws. Each state has specific rules about when you can legally return to driving.  Get enough rest. Lack of sleep can make seizures more likely to occur. Educating others Teach friends and family what to do if you have a seizure. They should:  Lay you on the ground to prevent a fall.  Cushion your head and body.  Loosen any tight clothing around your neck.  Turn you on your side. If vomiting occurs, this helps keep your airway clear.  Stay with you until you recover.  Not hold you down. Holding you down will not stop the seizure.  Not put anything in your mouth.  Know whether or not you need emergency care.  General instructions  Contact your health care provider each time you have a seizure.  Avoid anything that has ever triggered a seizure for you.  Keep a seizure diary. Record what you remember about each seizure, especially anything that might have triggered the seizure.  Keep all follow-up visits as told by your health care provider. This is important. Contact a health care provider if:  You have another seizure.  You have seizures more often.  Your seizure symptoms change.  You continue to have seizures with treatment.  You have symptoms of an infection or illness. They might  increase your risk of having a seizure. Get help right away if:  You have a seizure: ? That lasts longer than 5 minutes. ? That is different than previous seizures. ? That leaves you unable to speak or use a part of your body. ? That makes it harder to breathe. ? After a head injury.  You have: ? Multiple seizures in a row. ? Confusion or a severe headache  right after a seizure.  You are having seizures more often.  You do not wake up immediately after a seizure.  You injure yourself during a seizure. These symptoms may represent a serious problem that is an emergency. Do not wait to see if the symptoms will go away. Get medical help right away. Call your local emergency services (911 in the [place] Do not drive yourself to the hospital. This information is not intended to replace advice given to you by your health care provider. Make sure you discuss any questions you have with your health care provider. Document Released: 04/12/2000 Document Revised: 12/10/2015 Document Reviewed: 11/17/2015 Elsevier Interactive Patient Education  [employer]2018 [employer] Inc[employer]

## 2017-08-15 LAB — ZONISAMIDE LEVEL: ZONISAMIDE: 13.4 ug/mL (ref 10.0–40.0)

## 2017-08-18 ENCOUNTER — Telehealth: Payer: Self-pay | Admitting: Neurology

## 2017-08-18 NOTE — Telephone Encounter (Signed)
-----   Message from Anson FretAntonia B Ahern, MD sent at 08/18/2017 11:40 AM EDT ----- Labs normal thanks

## 2017-08-18 NOTE — Telephone Encounter (Signed)
Called the pt. No answer. LVM informing the pt of normal lab work. Informed the pt in VM to call if she has any questions.

## 2017-08-21 ENCOUNTER — Encounter: Payer: Self-pay | Admitting: Neurology

## 2017-08-26 ENCOUNTER — Ambulatory Visit: Payer: BLUE CROSS/BLUE SHIELD | Admitting: Internal Medicine

## 2017-09-03 ENCOUNTER — Ambulatory Visit (INDEPENDENT_AMBULATORY_CARE_PROVIDER_SITE_OTHER): Payer: BLUE CROSS/BLUE SHIELD | Admitting: Psychology

## 2017-09-03 DIAGNOSIS — F4323 Adjustment disorder with mixed anxiety and depressed mood: Secondary | ICD-10-CM | POA: Diagnosis not present

## 2017-09-03 DIAGNOSIS — F321 Major depressive disorder, single episode, moderate: Secondary | ICD-10-CM

## 2017-09-11 ENCOUNTER — Ambulatory Visit (INDEPENDENT_AMBULATORY_CARE_PROVIDER_SITE_OTHER): Payer: BLUE CROSS/BLUE SHIELD | Admitting: Psychology

## 2017-09-11 DIAGNOSIS — F321 Major depressive disorder, single episode, moderate: Secondary | ICD-10-CM | POA: Diagnosis not present

## 2017-09-17 DIAGNOSIS — G40219 Localization-related (focal) (partial) symptomatic epilepsy and epileptic syndromes with complex partial seizures, intractable, without status epilepticus: Secondary | ICD-10-CM | POA: Diagnosis not present

## 2017-09-17 DIAGNOSIS — G932 Benign intracranial hypertension: Secondary | ICD-10-CM | POA: Diagnosis not present

## 2017-09-19 ENCOUNTER — Encounter: Payer: Self-pay | Admitting: Internal Medicine

## 2017-10-01 ENCOUNTER — Ambulatory Visit (INDEPENDENT_AMBULATORY_CARE_PROVIDER_SITE_OTHER): Payer: BLUE CROSS/BLUE SHIELD | Admitting: Psychology

## 2017-10-01 ENCOUNTER — Encounter: Payer: Self-pay | Admitting: Neurology

## 2017-10-01 DIAGNOSIS — F321 Major depressive disorder, single episode, moderate: Secondary | ICD-10-CM

## 2017-10-01 DIAGNOSIS — F4323 Adjustment disorder with mixed anxiety and depressed mood: Secondary | ICD-10-CM | POA: Diagnosis not present

## 2017-10-05 ENCOUNTER — Other Ambulatory Visit: Payer: Self-pay | Admitting: Neurology

## 2017-10-05 DIAGNOSIS — G40909 Epilepsy, unspecified, not intractable, without status epilepticus: Secondary | ICD-10-CM

## 2017-10-06 NOTE — Telephone Encounter (Signed)
Spoke with pt. She is leaving town now and won't be back until the beginning of next month. RN scheduled appt for first available with Eber JonesCarolyn on Mon 7/22 @ 11:15 arrival time 10:45. Pt verbalized understanding.

## 2017-10-07 ENCOUNTER — Telehealth: Payer: Self-pay | Admitting: Neurology

## 2017-10-07 NOTE — Telephone Encounter (Signed)
[facility] Telephone [phone] fax [phone] - Spoke to Patient she has telephone number.

## 2017-10-13 ENCOUNTER — Ambulatory Visit: Payer: BLUE CROSS/BLUE SHIELD | Admitting: Psychology

## 2017-10-25 ENCOUNTER — Encounter: Payer: Self-pay | Admitting: Neurology

## 2017-10-27 ENCOUNTER — Ambulatory Visit: Payer: BLUE CROSS/BLUE SHIELD | Admitting: Psychology

## 2017-10-27 ENCOUNTER — Other Ambulatory Visit: Payer: Self-pay | Admitting: Neurology

## 2017-10-27 MED ORDER — ZONISAMIDE 100 MG PO CAPS: 300.0000 mg | ORAL_CAPSULE | Freq: Every day | ORAL | 0 refills | Status: DC

## 2017-11-03 ENCOUNTER — Encounter: Payer: Self-pay | Admitting: Nurse Practitioner

## 2017-11-04 ENCOUNTER — Telehealth: Payer: Self-pay | Admitting: *Deleted

## 2017-11-04 NOTE — Telephone Encounter (Signed)
Received my chart message from patient stating she had two seizures on 09/17/17, the day she saw neurologist, Dr Everlena CooperAndrew Evans. She has been on zonisamide 300 mg nightly since seeing Dr Lucia GaskinsAhern in April. She stated that she has not noticed the smell of medication coming out of her pores anymore. She had not had any further seizure events since 09/17/17. Dr Logan BoresEvans had told her to increase zonisamide to 400 mg nightly because of seizures on 09/17/17.  The patient has not done that stating she doesn't want to increase since she has not had further seizures, and she doesn't tolerate increased dose well.  She is asking if she needs to keep 11/17/17 FU with NP because she would like to wait longer to see if she is seizure-free before discussing changing medications. This RN advised will route to Dr Lucia GaskinsAhern and call her back with reply. She verbalized understanding, appreciation for call.

## 2017-11-04 NOTE — Telephone Encounter (Signed)
Kristina PancoastMary Clare- I'd like her to be seen at the follow up, do not cancel. She needs to follow Dr. Logan BoresEvans directions and she should not be driving at all. If she had seizures while taking 300mg  of zonisamide then the dose should be increased. Ask her to keep the appointment with Eber Jonesarolyn. If she does not then we may dismiss her for non compliance. thanks

## 2017-11-04 NOTE — Telephone Encounter (Signed)
Spoke with patient and informed Dr Lucia GaskinsAhern stated she needs to follow Dr Logan BoresEvans instructions and increase zonisamide to take 400 mg nightly. She recently got refill so will have enough medication to increase until her follow up in this office.  Advised she needs to come for follow up 11/17/17; do not cancel. Then advised she must not drive. This RN asked her if she is going to come for her follow up, and she said "yes". This RN reminded her of no driving and increasing medication.  Advised she call before 11/17/17 for any questions, concerns, or any seizure activity.  Patient verbalized understanding.

## 2017-11-10 ENCOUNTER — Ambulatory Visit: Payer: BLUE CROSS/BLUE SHIELD | Admitting: Psychology

## 2017-11-14 NOTE — Progress Notes (Deleted)
[facility]  PATIENT: Kristina Coleman DOB: Mar 04, 1976   REASON FOR VISIT: *** HISTORY FROM:    HISTORY OF PRESENT ILLNESS:  Interval history 08/14/2017: Patient here as a new referral for seizure. PMHx morbid obesity, seizure. Was initially seen in 2017. She was seen and MRI brain, EEGs, workup negative. Whole body lockin gp, generalized convulsions, foaming, eyes fixed straight, tongue biting and incontinence. Post-ictal 2-3 minutes. Mostly octurnal. Ocurring once a month, was not compliant with medication per notes (reviewed notes from neurology). Her last seizure was 10/2016 and afterwards she became compliant with AEDs. Per notes had 2 episodes during the day, 1 was at work and captured on video sitting on the desk and had head deviation, then RUE tonic flexion followed by the left then fell to the ground and had clonic b/l activity. Blanl stares occur twice per week with unresponsiveness. Last seizure was the 30th of last month she is on Zonisamide 200mg  at night. Had an MRI and also diagnosed with IIH. She was unhappy with her last Neurologist because they were just pushing pills.  The last seizure in 03/2017 was witnessed, staring for 5 minutes then tonic-clonic movements, no urinary incontinence, no tongue biting.   Meds tried: Keppra gave her headaches and mood swings, on Zonisamide currently  HPI:  Kristina Coleman is a 42 y.o. female here as a referral from Dr. Delrae AlfredMulberry for a seizure. Past medical history of anxiety. She woke up a week ago in the ambulance and she does not remember anything. She smokes marijuana daily. She has been smoking for 20 years. No FHx of seizures. No personal history of seizures. She bit her tongue. Patient's partner is here today. 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 She has been healthy denies any significant history. Partner provides most information. At 3am patient made a noise, patient would not respond, her arms  locked up, she started shaking, her whole body was shaking, her eyes were open like in a daze, she started having white foam out of her mouth, lasted 1-2 minutes, she rolled over on her back and started snoring real loud like trying to breath, she wouldn't wake up like she went back to sleep, EMS tried to wake her up, she was confused, she didn;t know what was going on and when they took her to ambulance she started mumbling something that didn't make sense. No inciting events or recent illnesses, no new medications. No jerking episodes or staring spells as a child. No inciting events. No known triggers. No other associated symptoms.    REVIEW OF SYSTEMS: Full 14 system review of systems performed and notable only for those listed, all others are neg:  Constitutional: neg  Cardiovascular: neg Ear/Nose/Throat: neg  Skin: neg Eyes: neg Respiratory: neg Gastroitestinal: neg  Hematology/Lymphatic: neg  Endocrine: neg Musculoskeletal:neg Allergy/Immunology: neg Neurological: neg Psychiatric: neg Sleep : neg   ALLERGIES: No Known Allergies  HOME MEDICATIONS: Outpatient Medications Prior to Visit  Medication Sig Dispense Refill  . zonisamide (ZONEGRAN) 100 MG capsule Take 3 capsules (300 mg total) by mouth at bedtime. 90 capsule 0   No facility-administered medications prior to visit.     PAST MEDICAL HISTORY: Past Medical History:  Diagnosis Date  . Anxiety   . History of chicken pox   . Intracranial hypertension   . Morbid obesity (HCC)   . Seizures (HCC) 11/2015  . Tobacco use 12/24/2016    PAST SURGICAL HISTORY: Past Surgical History:  Procedure Laterality Date  . galblader removal    . left knee surgery    . LUMBAR PUNCTURE     fluid removal in brain.   Marland Kitchen wisdom teeth removal      FAMILY HISTORY: Family History  Problem Relation Age of Onset  . Diabetes Mother   . Hypertension Mother   . Arthritis Mother   . Diabetes Father   . Hypertension Father   .  Glaucoma Father   . Cataracts Father   . Cancer Sister        breast cancer    SOCIAL HISTORY: Social History   Socioeconomic History  . Marital status: Significant Other    Spouse name: Not on file  . Number of children: 0  . Years of education: college  . Highest education level: Not on file  Occupational History  . Occupation: Psychiatrist   Social Needs  . Financial resource strain: Not on file  . Food insecurity:    Worry: Not on file    Inability: Not on file  . Transportation needs:    Medical: Not on file    Non-medical: Not on file  Tobacco Use  . Smoking status: Current Some Day Smoker    Packs/day: 0.20    Last attempt to quit: 06/25/2017    Years since quitting: 0.3  . Smokeless tobacco: Never Used  Substance and Sexual Activity  . Alcohol use: Yes    Comment: on occasion  . Drug use: Yes    Types: Marijuana    Comment: every day, she is smoking less now  . Sexual activity: Yes    Birth control/protection: None  Lifestyle  . Physical activity:    Days per week: Not on file    Minutes per session: Not on file  . Stress: Not on file  Relationships  . Social connections:    Talks on phone: Not on file    Gets together: Not on file    Attends religious service: Not on file    Active member of club or organization: Not on file    Attends meetings of clubs or organizations: Not on file    Relationship status: Not on file  . Intimate partner violence:    Fear of current or ex partner: Not on file    Emotionally abused: Not on file    Physically abused: Not on file    Forced sexual activity: Not on file  Other Topics Concern  . Not on file  Social History Narrative   Occasionally drinks tea      PHYSICAL EXAM  There were no vitals filed for this visit. There is no height or weight on file to calculate BMI.  Generalized: Well developed, in no acute distress  Head: normocephalic and atraumatic,. Oropharynx benign  Neck: Supple, no carotid  bruits  Cardiac: Regular rate rhythm, no murmur  Musculoskeletal: No deformity   Neurological examination   Mentation: Alert oriented to time, place, history taking. Attention span and concentration appropriate. Recent and remote memory intact.  Follows all commands speech and language fluent.   Cranial nerve II-XII: Fundoscopic exam reveals sharp disc margins.Pupils were equal round reactive to light extraocular movements were full, visual field were full on confrontational test. Facial sensation and strength were normal. hearing was intact to finger rubbing bilaterally. Uvula tongue midline. head turning and shoulder shrug were normal and symmetric.Tongue protrusion into cheek strength was normal. Motor: normal bulk and tone, full strength in the BUE, BLE,  fine finger movements normal, no pronator drift. No focal weakness Sensory: normal and symmetric to light touch, pinprick, and  Vibration, proprioception  Coordination: finger-nose-finger, heel-to-shin bilaterally, no dysmetria Reflexes: Brachioradialis 2/2, biceps 2/2, triceps 2/2, patellar 2/2, Achilles 2/2, plantar responses were flexor bilaterally. Gait and Station: Rising up from seated position without assistance, normal stance,  moderate stride, good arm swing, smooth turning, able to perform tiptoe, and heel walking without difficulty. Tandem gait is steady  DIAGNOSTIC DATA (LABS, IMAGING, TESTING) - I reviewed patient records, labs, notes, testing and imaging myself where available.  Lab Results  Component Value Date   WBC 3.5 (L) 06/25/2017   HGB 13.1 06/25/2017   HCT 39.8 06/25/2017   MCV 95.9 06/25/2017   PLT 278.0 06/25/2017      Component Value Date/Time   NA 141 06/27/2017 1406   K 4.7 06/27/2017 1406   CL 108 06/27/2017 1406   CO2 28 06/27/2017 1406   GLUCOSE 92 06/27/2017 1406   BUN 10 06/27/2017 1406   CREATININE 0.91 06/27/2017 1406   CALCIUM 9.7 06/27/2017 1406   PROT 6.6 06/27/2017 1406   ALBUMIN 3.8  06/27/2017 1406   AST 15 06/27/2017 1406   ALT 21 06/27/2017 1406   ALKPHOS 62 06/27/2017 1406   BILITOT 0.4 06/27/2017 1406   GFRNONAA >60 07/25/2016 0912   GFRAA >60 07/25/2016 0912   No results found for: CHOL, HDL, LDLCALC, LDLDIRECT, TRIG, CHOLHDL Lab Results  Component Value Date   HGBA1C 5.1 10/18/2016   No results found for: VITAMINB12 Lab Results  Component Value Date   TSH 1.62 06/25/2017    ***  ASSESSMENT AND PLAN  42 y.o. year old female  has a past medical history of Anxiety, History of chicken pox, Intracranial hypertension, Morbid obesity (HCC), Seizures (HCC) (11/2015), and Tobacco use (12/24/2016). 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 She has already seen neurology, had a workup with EEG and MRI brain, also diagnosed with IIH.    Semiology: Described by partner as : 1-Whole body locking up then has generalized convulsions, foaming, eyes fixed straight ahead. She would have tongue biting and incontinence. Lasts 2 mins. Post ictal sleep for 2-3 mins then becomes more responsive. Returns to baseline in 10-15 mins. Mostly nocturnal  Captured on video which she brings a copy of: She was sitting on the desk and had right head deviation , then RUE tonic flexion followed by the left, then fell to the ground and had clonic b/l activity. Had LOC for sometime then was struggling to get up and trying to fix the chair.    - She could not tolerate keppra, now on zonisamide and having difficulty tolerating with breakthrough seizures. She is tired of people "pushing pills" at her  - I think she needs to be referred to Dr. Everlena Cooper at [facility]. He is an Herbalist with capability of admitting patient into the hospital, removing medications with video monitoring to see if a seizure can be caught. Based on EEG findings may be able to to better treat with AEDs or even recommend surgical procedures for this patient.  - In  the meantime she should increase her Zonisamide to 300mg  qhs which also treats the IIH  - Discussed IIH, risk of permanent vision loss, need for weight loss and follow up with ophthamology. She has no symptoms of IIH, may be an incidental finding oe elevated pressure but needs to be followed for this, consinue with  ophthalmology and return here if has symtpoms.    Patient is unable to drive, operate heavy machinery, perform activities at heights or participate in water activities until 6 months seizure free. Discussed multiple times, patient and partner acknowledge.    Nilda Riggs, [facility], [facility], APRN  [facility] [address] [place], [place] [address] [phone]

## 2017-11-17 ENCOUNTER — Ambulatory Visit: Payer: Self-pay | Admitting: Nurse Practitioner

## 2017-11-17 ENCOUNTER — Telehealth: Payer: Self-pay | Admitting: *Deleted

## 2017-11-17 NOTE — Telephone Encounter (Signed)
LVM and apologized for reason for call. Informed patient that her follow up today needs to be rescheduled, NP is out of office. Advised her to call back and reschedule with Shanda BumpsJessica NP to be seen this week. Advised her she needs to be seen for discussion on seizure medications. Left office number.

## 2017-11-19 ENCOUNTER — Ambulatory Visit (INDEPENDENT_AMBULATORY_CARE_PROVIDER_SITE_OTHER): Payer: BLUE CROSS/BLUE SHIELD | Admitting: Adult Health

## 2017-11-19 ENCOUNTER — Encounter: Payer: Self-pay | Admitting: Adult Health

## 2017-11-19 VITALS — BP 142/88 | HR 71 | Ht 66.0 in | Wt 270.8 lb

## 2017-11-19 DIAGNOSIS — G40909 Epilepsy, unspecified, not intractable, without status epilepticus: Secondary | ICD-10-CM | POA: Diagnosis not present

## 2017-11-19 NOTE — Progress Notes (Signed)
[facility]    Provider:  Dr Lucia Gaskins Referring Provider: Elease Etienne Bonna Gains, NP Primary Care Physician:  Anne Ng, NP  CC:  Seizure  Interval history 11/19/17: Patient returns today for follow-up visit.  She states that her most previous seizure occurred in May where she had 2 different ones 3 hours apart while she was sleeping.  They were witnessed by her girlfriend.  She states they were "[place] seizure" where she urinated, increased saliva, shaking, soreness all over after, headache, disoriented and can take a couple days up to 1 week to feel completely back to her normal self.  She continues to be compliant with the zonisamide 300 mg at bedtime.  She is having an initial consultation at [facility] next week.  Patient continues not to drive.   Interval history 08/14/2017 AA: Patient here as a new referral for seizure. PMHx morbid obesity, seizure. Was initially seen in 2017. She was seen and MRI brain, EEGs, workup negative. Whole body lockin gp, generalized convulsions, foaming, eyes fixed straight, tongue biting and incontinence. Post-ictal 2-3 minutes. Mostly octurnal. Ocurring once a month, was not compliant with medication per notes (reviewed notes from neurology). Her last seizure was 10/2016 and afterwards she became compliant with AEDs. Per notes had 2 episodes during the day, 1 was at work and captured on video sitting on the desk and had head deviation, then RUE tonic flexion followed by the left then fell to the ground and had clonic b/l activity. Blanl stares occur twice per week with unresponsiveness. Last seizure was the 30th of last month she is on Zonisamide 200mg  at night. Had an MRI and also diagnosed with IIH. She was unhappy with her last Neurologist because they were just pushing pills.  The last seizure in 03/2017 was witnessed, staring for 5 minutes then tonic-clonic movements, no urinary incontinence, no tongue biting.   Meds tried: Keppra gave her  headaches and mood swings, on Zonisamide currently  HPI:  Masyn Fullam is a 42 y.o. female here as a referral from Dr. Elease Etienne for a seizure. Past medical history of anxiety. She woke up a week ago in the ambulance and she does not remember anything. She smokes marijuana daily. She has been smoking for 20 years. No FHx of seizures. No personal history of seizures. She bit her tongue. Patient's partner is here today. 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 She has been healthy denies any significant history. Partner provides most information. At 3am patient made a noise, patient would not respond, her arms locked up, she started shaking, her whole body was shaking, her eyes were open like in a daze, she started having white foam out of her mouth, lasted 1-2 minutes, she rolled over on her back and started snoring real loud like trying to breath, she wouldn't wake up like she went back to sleep, EMS tried to wake her up, she was confused, she didn;t know what was going on and when they took her to ambulance she started mumbling something that didn't make sense. No inciting events or recent illnesses, no new medications. No jerking episodes or staring spells as a child. No inciting events. No known triggers. No other associated symptoms.   Reviewed notes, labs and imaging from outside physicians, which showed:  CT of the head 12/19/2015 showed No acute intracranial abnormalities including mass lesion or mass effect, hydrocephalus, extra-axial fluid collection, midline shift, hemorrhage, or acute infarction, large ischemic events (personally reviewed  images)  12/19/2015: CBC unremarkable, CMP with CO2 21 and glucose 139 otherwise unremarkable, urinalysis showed blood inurine, amber color, large hgb, +THC in urine, many bacteria in the blood patient menstruating at the time.  Reviewed records, patient was seen in 2014 in the emergency room for palpitations in the setting of stress.  Palpitations improved with deep breaths and relaxation. Diagnosed with anxiety. Patient was seen in 2015 in the emergency room for knee pain after falling up a flight of concrete steps and collapsing. Her leg fell through the stairs and he she hit her left knee on the railing. In 2015 she reported to the emergency room with lower abdominal pain diagnosed with viral gastroenteritis. Patient was seen in the emergency room on 12/19/2015 with her significant other. Significant other reports that patient woke her up in the middle the night with the ground and then partners body "locked up", the patient became foaming at the mouth for approximately 67, after this the patient was snoring very loudly and she slowly came to appear very confused likely postictal for approximately 10 minutes, she was incontinent of urine during this episode, no tongue biting, no history of seizures, denies history of alcohol use or any medications at home. Exam was normal in the emergency room.   Review of Systems: Patient complains of symptoms per HPI as well as the following symptoms: No CP, no SOB. Pertinent negatives per HPI. All others negative.   Social History   Socioeconomic History  . Marital status: Significant Other    Spouse name: Not on file  . Number of children: 0  . Years of education: college  . Highest education level: Not on file  Occupational History  . Occupation: Psychiatrist   Social Needs  . Financial resource strain: Not on file  . Food insecurity:    Worry: Not on file    Inability: Not on file  . Transportation needs:    Medical: Not on file    Non-medical: Not on file  Tobacco Use  . Smoking status: Current Some Day Smoker    Packs/day: 0.20    Last attempt to quit: 06/25/2017    Years since quitting: 0.4  . Smokeless tobacco: Never Used  Substance and Sexual Activity  . Alcohol use: Yes    Comment: on occasion  . Drug use: Yes    Types: Marijuana    Comment: every day, she  is smoking less now  . Sexual activity: Yes    Birth control/protection: None  Lifestyle  . Physical activity:    Days per week: Not on file    Minutes per session: Not on file  . Stress: Not on file  Relationships  . Social connections:    Talks on phone: Not on file    Gets together: Not on file    Attends religious service: Not on file    Active member of club or organization: Not on file    Attends meetings of clubs or organizations: Not on file    Relationship status: Not on file  . Intimate partner violence:    Fear of current or ex partner: Not on file    Emotionally abused: Not on file    Physically abused: Not on file    Forced sexual activity: Not on file  Other Topics Concern  . Not on file  Social History Narrative   Occasionally drinks tea     Family History  Problem Relation Age of Onset  . Diabetes Mother   .  Hypertension Mother   . Arthritis Mother   . Diabetes Father   . Hypertension Father   . Glaucoma Father   . Cataracts Father   . Cancer Sister        breast cancer    Past Medical History:  Diagnosis Date  . Anxiety   . History of chicken pox   . Intracranial hypertension   . Morbid obesity (HCC)   . Seizures (HCC) 11/2015  . Tobacco use 12/24/2016    Past Surgical History:  Procedure Laterality Date  . galblader removal    . left knee surgery    . LUMBAR PUNCTURE     fluid removal in brain.   Marland Kitchen. wisdom teeth removal      Current Outpatient Medications  Medication Sig Dispense Refill  . zonisamide (ZONEGRAN) 100 MG capsule Take 3 capsules (300 mg total) by mouth at bedtime. 90 capsule 0   No current facility-administered medications for this visit.     Allergies as of 11/19/2017  . (No Known Allergies)    Vitals: There were no vitals taken for this visit. Last Weight:  Wt Readings from Last 1 Encounters:  08/14/17 265 lb (120.2 kg)   Last Height:   Ht Readings from Last 1 Encounters:  08/14/17 5\' 6"  (1.676 m)     Physical exam: Exam: Gen: NAD, conversant, well nourised, obese, well groomed                     CV: RRR, no MRG. No Carotid Bruits. No peripheral edema, warm, nontender Eyes: Conjunctivae clear without exudates or hemorrhage  Neuro: Detailed Neurologic Exam  Speech:    Speech is normal; fluent and spontaneous with normal comprehension.  Cognition:    The patient is oriented to person, place, and time;     recent and remote memory intact;     language fluent;     normal attention, concentration,     fund of knowledge Cranial Nerves:    The pupils are equal, round, and reactive to light. The fundi are normal and spontaneous venous pulsations are present. Visual fields are full to finger confrontation. Extraocular movements are intact. Trigeminal sensation is intact and the muscles of mastication are normal. The face is symmetric. The palate elevates in the midline. Hearing intact. Voice is normal. Shoulder shrug is normal. The tongue has normal motion without fasciculations.   Coordination:    Normal finger to nose and heel to shin. Normal rapid alternating movements.   Gait:    Heel-toe and tandem gait are normal.   Motor Observation:    No asymmetry, no atrophy, and no involuntary movements noted. Tone:    Normal muscle tone.    Posture:    Posture is normal. normal erect    Strength:    Strength is V/V in the upper and lower limbs.      Sensation: intact to LT     Reflex Exam:  DTR's:    Deep tendon reflexes in the upper and lower extremities are normal bilaterally.   Toes:    The toes are downgoing bilaterally.   Clonus:    Clonus is absent.      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 She has already seen neurology, had a workup with EEG and MRI brain, also diagnosed with IIH.    As patient's seizures have been stable since May, we will continue current regimen of zonisamide 300 mg at bedtime.  Patient  does have initial consultation at [facility] next week as this was highly encouraged as patient looking for additional answers to reasoning behind her seizures.  Advised patient that she does not need to make follow-up appointment to [facility] as she will be following with [facility] for seizure management.  Advised patient to continue not to drive at this time and patient verbalized understanding.   George Hugh, AGNP-BC  [facility] [address] [place], [place] [address]  Phone [phone] Fax [phone] Note: This document was prepared with digital dictation and possible smart phrase technology. Any transcriptional errors that result from this process are unintentional.

## 2017-11-19 NOTE — Patient Instructions (Addendum)
Your Plan:  Continue zonisamide 300mg  at night Follow up with [facility] neurology next week  Call with questions or concerns - no need to schedule follow up appointment with us       Thank you for coming to see us at [facility]. I hope we have been able to provide you high quality care today.  You may receive a patient satisfaction survey over the next few weeks. We would appreciate your feedback and comments so that we may continue to improve ourselves and the health of our patients.

## 2017-11-24 ENCOUNTER — Ambulatory Visit: Payer: BLUE CROSS/BLUE SHIELD | Admitting: Psychology

## 2017-11-24 DIAGNOSIS — G932 Benign intracranial hypertension: Secondary | ICD-10-CM | POA: Diagnosis not present

## 2017-11-24 DIAGNOSIS — G40009 Localization-related (focal) (partial) idiopathic epilepsy and epileptic syndromes with seizures of localized onset, not intractable, without status epilepticus: Secondary | ICD-10-CM | POA: Diagnosis not present

## 2017-11-25 DIAGNOSIS — H5340 Unspecified visual field defects: Secondary | ICD-10-CM | POA: Diagnosis not present

## 2017-11-25 DIAGNOSIS — H5052 Exophoria: Secondary | ICD-10-CM | POA: Diagnosis not present

## 2017-11-25 DIAGNOSIS — G932 Benign intracranial hypertension: Secondary | ICD-10-CM | POA: Diagnosis not present

## 2017-11-25 DIAGNOSIS — H5702 Anisocoria: Secondary | ICD-10-CM | POA: Diagnosis not present

## 2017-11-27 ENCOUNTER — Encounter: Payer: Self-pay | Admitting: Nurse Practitioner

## 2017-12-10 NOTE — Progress Notes (Signed)
made any corrections needed to history, physical, neuro exam,assessment and agree with plan as stated above.    Orphia Mctigue, MD 

## 2017-12-15 ENCOUNTER — Ambulatory Visit (INDEPENDENT_AMBULATORY_CARE_PROVIDER_SITE_OTHER): Payer: BLUE CROSS/BLUE SHIELD | Admitting: Nurse Practitioner

## 2017-12-15 ENCOUNTER — Other Ambulatory Visit (HOSPITAL_COMMUNITY)
Admission: RE | Admit: 2017-12-15 | Discharge: 2017-12-15 | Disposition: A | Discharge status: Home / Self Care | Payer: BLUE CROSS/BLUE SHIELD | Source: Ambulatory Visit | Attending: Nurse Practitioner | Admitting: Nurse Practitioner

## 2017-12-15 ENCOUNTER — Encounter: Payer: Self-pay | Admitting: Neurology

## 2017-12-15 ENCOUNTER — Encounter: Payer: Self-pay | Admitting: Nurse Practitioner

## 2017-12-15 VITALS — BP 122/80 | HR 75 | Temp 98.6°F | Ht 66.0 in | Wt 266.0 lb

## 2017-12-15 DIAGNOSIS — Z124 Encounter for screening for malignant neoplasm of cervix: Secondary | ICD-10-CM | POA: Diagnosis not present

## 2017-12-15 DIAGNOSIS — Z0001 Encounter for general adult medical examination with abnormal findings: Secondary | ICD-10-CM | POA: Diagnosis not present

## 2017-12-15 DIAGNOSIS — F4323 Adjustment disorder with mixed anxiety and depressed mood: Secondary | ICD-10-CM

## 2017-12-15 DIAGNOSIS — Z136 Encounter for screening for cardiovascular disorders: Secondary | ICD-10-CM

## 2017-12-15 DIAGNOSIS — Z13220 Encounter for screening for lipoid disorders: Secondary | ICD-10-CM | POA: Diagnosis not present

## 2017-12-15 DIAGNOSIS — Z1231 Encounter for screening mammogram for malignant neoplasm of breast: Secondary | ICD-10-CM

## 2017-12-15 DIAGNOSIS — Z1239 Encounter for other screening for malignant neoplasm of breast: Secondary | ICD-10-CM

## 2017-12-15 LAB — LIPID PANEL
CHOL/HDL RATIO: 4
Cholesterol: 166 mg/dL (ref 0–200)
HDL: 46.8 mg/dL (ref >39.00)
LDL Cholesterol: 103 mg/dL — ABNORMAL HIGH (ref 0–99)
NONHDL: 119.47
Triglycerides: 83 mg/dL (ref 0.0–149.0)
VLDL: 16.6 mg/dL (ref 0.0–40.0)

## 2017-12-15 NOTE — Progress Notes (Signed)
Subjective:    Patient ID: Kristina Coleman, female    DOB: [birth year], 42 y.o.   MRN: [id number]  Patient presents today for complete physical  HPI  Denies any acute complaint.  Epilepsy: Managed by [facility] (Dr. Lorelee MarketVanschaick) and [facility] (Dr. Sherlean FootSinha). Also followed by Dr. Daphine DeutscherMartin (ophthalmology due to Last seizure activity 2months ago. Current use of Zonisamide 300mg .  Due to unemployment at this time, she will needs a letter to Social service office to extend [employer] stamp provision.  Sexual History (orientation,birth control, marital status, STD):sexually active, female partner.  Depression/Suicide:due to health and inability to drive. Expresses frustration over epilepsy prognosis, and being dependent on her partner at this time. She is not interested in use of medication for mood, but going for counseling sessions at this time which are somewhat helpful. [facility] 12/15/2017 06/25/2017 06/25/2017 10/18/2016  Decreased Interest 2 1 1  0  Down, Depressed, Hopeless 3 2 1 1   PHQ - 2 Score 5 3 2 1   Altered sleeping 1 2 - 0  Tired, decreased energy 2 2 - 1  Change in appetite 1 1 - 0  Feeling bad or failure about yourself  2 3 - 1  Trouble concentrating 2 1 - 0  Moving slowly or fidgety/restless 1 2 - 0  Suicidal thoughts 1 2 - 0  PHQ-9 Score 15 16 - 3   GAD 7 : Generalized Anxiety Score 12/15/2017 06/25/2017  Nervous, Anxious, on Edge 2 2  Control/stop worrying 3 3  Worry too much - different things 3 3  Trouble relaxing 2 2  Restless 2 1  Easily annoyed or irritable 2 2  Afraid - awful might happen 2 3  Total GAD 7 Score 16 16   Vision:up to date, done by Dr. Daphine DeutscherMartin  Dental:no insurance coverage  Immunizations: (TDAP, Hep C screen, Pneumovax, Influenza, zoster)  Health Maintenance  Topic Date Due  . Pap Smear  02/26/1998  . Flu Shot  02/25/2018*  . Tetanus Vaccine  12/16/2018*  . HIV Screening  12/16/2018*  *Topic was postponed. The date shown is not the original due  date.   Diet:regular, dependent on food stamps.  Weight:  Wt Readings from Last 3 Encounters:  12/15/17 266 lb (120.7 kg)  11/19/17 270 lb 12.8 oz (122.8 kg)  08/14/17 265 lb (120.2 kg)   Exercise:none due to fear of another seizure activity  Fall Risk: Fall Risk  06/25/2017 06/25/2017  Falls in the past year? No No   Home Safety:home with female partner  Advanced Directive: Advanced Directives 04/10/2017  Does Patient Have a Medical Advance Directive? No    Medications and allergies reviewed with patient and updated if appropriate.  Patient Active Problem List   Diagnosis Date Noted  . Adjustment disorder with mixed anxiety and depressed mood 07/09/2017  . Tobacco use 12/24/2016  . Morbid obesity (HCC)   . Idiopathic intracranial hypertension 12/27/2015  . Partial idiopathic epilepsy with seizures of localized onset, not intractable, with status epilepticus (HCC) 11/28/2015    Current Outpatient Medications on File Prior to Visit  Medication Sig Dispense Refill  . zonisamide (ZONEGRAN) 100 MG capsule Take 3 capsules (300 mg total) by mouth at bedtime. 90 capsule 0   No current facility-administered medications on file prior to visit.     Past Medical History:  Diagnosis Date  . Anxiety   . History of chicken pox   . Intracranial hypertension   . Morbid obesity (HCC)   . Seizures (  HCC) 11/2015  . Tobacco use 12/24/2016    Past Surgical History:  Procedure Laterality Date  . galblader removal    . left knee surgery    . LUMBAR PUNCTURE     fluid removal in brain.   Marland Kitchen. wisdom teeth removal      Social History   Socioeconomic History  . Marital status: Significant Other    Spouse name: Not on file  . Number of children: 0  . Years of education: college  . Highest education level: Not on file  Occupational History  . Occupation: unemployed  Social Needs  . Financial resource strain: Not on file  . Food insecurity:    Worry: Sometimes true    Inability:  Sometimes true  . Transportation needs:    Medical: Yes    Non-medical: Yes  Tobacco Use  . Smoking status: Light Tobacco Smoker    Packs/day: 0.25  . Smokeless tobacco: Never Used  Substance and Sexual Activity  . Alcohol use: Yes    Comment: on occasion  . Drug use: Yes    Types: Marijuana    Comment: every day  . Sexual activity: Yes    Birth control/protection: None  Lifestyle  . Physical activity:    Days per week: 0 days    Minutes per session: 0 min  . Stress: Not on file  Relationships  . Social connections:    Talks on phone: More than three times a week    Gets together: More than three times a week    Attends religious service: Never    Active member of club or organization: No    Attends meetings of clubs or organizations: Never    Relationship status: Living with partner  Other Topics Concern  . Not on file  Social History Narrative   Occasionally drinks tea     Family History  Problem Relation Age of Onset  . Diabetes Mother   . Hypertension Mother   . Arthritis Mother   . Diabetes Father   . Hypertension Father   . Glaucoma Father   . Cataracts Father   . Cancer Sister        breast cancer       Review of Systems  Constitutional: Negative for fever, malaise/fatigue and weight loss.  HENT: Negative for congestion and sore throat.   Eyes:       Negative for visual changes  Respiratory: Negative for cough and shortness of breath.   Cardiovascular: Negative for chest pain, palpitations and leg swelling.  Gastrointestinal: Negative for blood in stool, constipation, diarrhea and heartburn.  Genitourinary: Negative for dysuria, frequency and urgency.  Musculoskeletal: Negative for falls, joint pain and myalgias.  Skin: Negative for rash.  Neurological: Negative for dizziness, sensory change and headaches.  Endo/Heme/Allergies: Does not bruise/bleed easily.  Psychiatric/Behavioral: Positive for depression and suicidal ideas. Negative for  hallucinations. The patient is nervous/anxious and has insomnia.     Objective:   Vitals:   12/15/17 0955  BP: 122/80  Pulse: 75  Temp: 98.6 F (37 C)  SpO2: 98%    Body mass index is 42.93 kg/m.   Physical Examination:  Physical Exam  Constitutional: She is oriented to person, place, and time. No distress.  HENT:  Right Ear: External ear normal.  Left Ear: External ear normal.  Mouth/Throat: Oropharynx is clear and moist.  Neck: Normal range of motion. Neck supple. No thyromegaly present.  Cardiovascular: Normal rate, regular rhythm, normal heart sounds and intact  distal pulses.  Pulmonary/Chest: Effort normal and breath sounds normal. Right breast exhibits no inverted nipple, no mass, no nipple discharge, no skin change and no tenderness. Left breast exhibits no inverted nipple, no mass, no nipple discharge, no skin change and no tenderness. Breasts are symmetrical.  Abdominal: Soft. Bowel sounds are normal. Hernia confirmed negative in the right inguinal area and confirmed negative in the left inguinal area.  Genitourinary: Rectum normal and vagina normal. Pelvic exam was performed with patient supine. Cervix exhibits no motion tenderness, no discharge and no friability. Right adnexum displays no tenderness. Left adnexum displays no tenderness. No erythema or tenderness in the vagina. No vaginal discharge found.  Genitourinary Comments: Chaperone present  Musculoskeletal: Normal range of motion. She exhibits no edema.  Lymphadenopathy:    She has no cervical adenopathy. No inguinal adenopathy noted on the right or left side.  Neurological: She is alert and oriented to person, place, and time.  Skin: Skin is warm and dry.  Psychiatric: Her speech is normal. Her affect is labile. She is slowed. She expresses no suicidal plans and no homicidal plans.  Vitals reviewed.   ASSESSMENT and PLAN:  Bertine was seen today for annual exam.  Diagnoses and all orders for this  visit:  Encounter for preventative adult health care exam with abnormal findings -     MM 3D SCREEN BREAST BILATERAL; Future -     Lipid panel -     Cytology - PAP  Adjustment disorder with mixed anxiety and depressed mood  Encounter for lipid screening for cardiovascular disease -     Lipid panel  Breast cancer screening -     MM 3D SCREEN BREAST BILATERAL; Future  Encounter for Papanicolaou smear for cervical cancer screening -     Cytology - PAP   No problem-specific Assessment & Plan notes found for this encounter.     Follow up: Return in about 3 months (around 03/17/2018) for situation anxiety and depression.Alysia Penna, NP

## 2017-12-15 NOTE — Patient Instructions (Addendum)
Mild elevation in LDL. With 42yr cardiovascular risk of 3.54, which is <7%. No need for medication at this time. Encourage tobacco cessation and DASH diet. Let me know if you change your mind about using antidepressant along side sessions with psychologist.  Maintain counseling sessions and appt with neurology. Let me know if you change your mind about taking antidepressant.  Health Maintenance, Female Adopting a healthy lifestyle and getting preventive care can go a long way to promote health and wellness. Talk with your health care provider about what schedule of regular examinations is right for you. This is a good chance for you to check in with your provider about disease prevention and staying healthy. In between checkups, there are plenty of things you can do on your own. Experts have done a lot of research about which lifestyle changes and preventive measures are most likely to keep you healthy. Ask your health care provider for more information. Weight and diet Eat a healthy diet  Be sure to include plenty of vegetables, fruits, low-fat dairy products, and lean protein.  Do not eat a lot of foods high in solid fats, added sugars, or salt.  Get regular exercise. This is one of the most important things you can do for your health. ? Most adults should exercise for at least 150 minutes each week. The exercise should increase your heart rate and make you sweat (moderate-intensity exercise). ? Most adults should also do strengthening exercises at least twice a week. This is in addition to the moderate-intensity exercise.  Maintain a healthy weight  Body mass index (BMI) is a measurement that can be used to identify possible weight problems. It estimates body fat based on height and weight. Your health care provider can help determine your BMI and help you achieve or maintain a healthy weight.  For females 41years of age and older: ? A BMI below 18.5 is considered underweight. ? A BMI  of 18.5 to 24.9 is normal. ? A BMI of 25 to 29.9 is considered overweight. ? A BMI of 30 and above is considered obese.  Watch levels of cholesterol and blood lipids  You should start having your blood tested for lipids and cholesterol at 41years of age, then have this test every 5 years.  You may need to have your cholesterol levels checked more often if: ? Your lipid or cholesterol levels are high. ? You are older than 41years of age. ? You are at high risk for heart disease.  Cancer screening Lung Cancer  Lung cancer screening is recommended for adults 41years old who are at high risk for lung cancer because of a history of smoking.  A yearly low-dose CT scan of the lungs is recommended for people who: ? Currently smoke. ? Have quit within the past 15 years. ? Have at least a 30-pack-year history of smoking. A pack year is smoking an average of one pack of cigarettes a day for 1 year.  Yearly screening should continue until it has been 15 years since you quit.  Yearly screening should stop if you develop a health problem that would prevent you from having lung cancer treatment.  Breast Cancer  Practice breast self-awareness. This means understanding how your breasts normally appear and feel.  It also means doing regular breast self-exams. Let your health care provider know about any changes, no matter how small.  If you are in your 20s or 30s, you should have a clinical breast exam (CBE)  by a health care provider every 1-3 years as part of a regular health exam.  If you are 65 or older, have a CBE every year. Also consider having a breast X-ray (mammogram) every year.  If you have a family history of breast cancer, talk to your health care provider about genetic screening.  If you are at high risk for breast cancer, talk to your health care provider about having an MRI and a mammogram every year.  Breast cancer gene (BRCA) assessment is recommended for women who have  family members with BRCA-related cancers. BRCA-related cancers include: ? Breast. ? Ovarian. ? Tubal. ? Peritoneal cancers.  Results of the assessment will determine the need for genetic counseling and BRCA1 and BRCA2 testing.  Cervical Cancer Your health care provider may recommend that you be screened regularly for cancer of the pelvic organs (ovaries, uterus, and vagina). This screening involves a pelvic examination, including checking for microscopic changes to the surface of your cervix (Pap test). You may be encouraged to have this screening done every 3 years, beginning at age 41.  For women ages 44-65, health care providers may recommend pelvic exams and Pap testing every 3 years, or they may recommend the Pap and pelvic exam, combined with testing for human papilloma virus (HPV), every 5 years. Some types of HPV increase your risk of cervical cancer. Testing for HPV may also be done on women of any age with unclear Pap test results.  Other health care providers may not recommend any screening for nonpregnant women who are considered low risk for pelvic cancer and who do not have symptoms. Ask your health care provider if a screening pelvic exam is right for you.  If you have had past treatment for cervical cancer or a condition that could lead to cancer, you need Pap tests and screening for cancer for at least 20 years after your treatment. If Pap tests have been discontinued, your risk factors (such as having a new sexual partner) need to be reassessed to determine if screening should resume. Some women have medical problems that increase the chance of getting cervical cancer. In these cases, your health care provider may recommend more frequent screening and Pap tests.  Colorectal Cancer  This type of cancer can be detected and often prevented.  Routine colorectal cancer screening usually begins at 41 years of age and continues through 41 years of age.  Your health care provider may  recommend screening at an earlier age if you have risk factors for colon cancer.  Your health care provider may also recommend using home test kits to check for hidden blood in the stool.  A small camera at the end of a tube can be used to examine your colon directly (sigmoidoscopy or colonoscopy). This is done to check for the earliest forms of colorectal cancer.  Routine screening usually begins at age 74.  Direct examination of the colon should be repeated every 5-10 years through 41 years of age. However, you may need to be screened more often if early forms of precancerous polyps or small growths are found.  Skin Cancer  Check your skin from head to toe regularly.  Tell your health care provider about any new moles or changes in moles, especially if there is a change in a mole's shape or color.  Also tell your health care provider if you have a mole that is larger than the size of a pencil eraser.  Always use sunscreen. Apply sunscreen liberally and  repeatedly throughout the day.  Protect yourself by wearing long sleeves, pants, a wide-brimmed hat, and sunglasses whenever you are outside.  Heart disease, diabetes, and high blood pressure  High blood pressure causes heart disease and increases the risk of stroke. High blood pressure is more likely to develop in: ? People who have blood pressure in the high end of the normal range (130-139/85-89 mm Hg). ? People who are overweight or obese. ? People who are African American.  If you are 64-5 years of age, have your blood pressure checked every 3-5 years. If you are 41 years of age or older, have your blood pressure checked every year. You should have your blood pressure measured twice-once when you are at a hospital or clinic, and once when you are not at a hospital or clinic. Record the average of the two measurements. To check your blood pressure when you are not at a hospital or clinic, you can use: ? An automated blood pressure  machine at a pharmacy. ? A home blood pressure monitor.  If you are between 64 years and 73 years old, ask your health care provider if you should take aspirin to prevent strokes.  Have regular diabetes screenings. This involves taking a blood sample to check your fasting blood sugar level. ? If you are at a normal weight and have a low risk for diabetes, have this test once every three years after 41 years of age. ? If you are overweight and have a high risk for diabetes, consider being tested at a younger age or more often. Preventing infection Hepatitis B  If you have a higher risk for hepatitis B, you should be screened for this virus. You are considered at high risk for hepatitis B if: ? You were born in a country where hepatitis B is common. Ask your health care provider which countries are considered high risk. ? Your parents were born in a high-risk country, and you have not been immunized against hepatitis B (hepatitis B vaccine). ? You have HIV or AIDS. ? You use needles to inject street drugs. ? You live with someone who has hepatitis B. ? You have had sex with someone who has hepatitis B. ? You get hemodialysis treatment. ? You take certain medicines for conditions, including cancer, organ transplantation, and autoimmune conditions.  Hepatitis C  Blood testing is recommended for: ? Everyone born from [birth year] through 1964. ? Anyone with known risk factors for hepatitis C.  Sexually transmitted infections (STIs)  You should be screened for sexually transmitted infections (STIs) including gonorrhea and chlamydia if: ? You are sexually active and are younger than 41 years of age. ? You are older than 41 years of age and your health care provider tells you that you are at risk for this type of infection. ? Your sexual activity has changed since you were last screened and you are at an increased risk for chlamydia or gonorrhea. Ask your health care provider if you are at  risk.  If you do not have HIV, but are at risk, it may be recommended that you take a prescription medicine daily to prevent HIV infection. This is called pre-exposure prophylaxis (PrEP). You are considered at risk if: ? You are sexually active and do not regularly use condoms or know the HIV status of your partner(s). ? You take drugs by injection. ? You are sexually active with a partner who has HIV.  Talk with your health care provider about whether you  are at high risk of being infected with HIV. If you choose to begin PrEP, you should first be tested for HIV. You should then be tested every 3 months for as long as you are taking PrEP. Pregnancy  If you are premenopausal and you may become pregnant, ask your health care provider about preconception counseling.  If you may become pregnant, take 400 to 800 micrograms (mcg) of folic acid every day.  If you want to prevent pregnancy, talk to your health care provider about birth control (contraception). Osteoporosis and menopause  Osteoporosis is a disease in which the bones lose minerals and strength with aging. This can result in serious bone fractures. Your risk for osteoporosis can be identified using a bone density scan.  If you are 63 years of age or older, or if you are at risk for osteoporosis and fractures, ask your health care provider if you should be screened.  Ask your health care provider whether you should take a calcium or vitamin D supplement to lower your risk for osteoporosis.  Menopause may have certain physical symptoms and risks.  Hormone replacement therapy may reduce some of these symptoms and risks. Talk to your health care provider about whether hormone replacement therapy is right for you. Follow these instructions at home:  Schedule regular health, dental, and eye exams.  Stay current with your immunizations.  Do not use any tobacco products including cigarettes, chewing tobacco, or electronic  cigarettes.  If you are pregnant, do not drink alcohol.  If you are breastfeeding, limit how much and how often you drink alcohol.  Limit alcohol intake to no more than 1 drink per day for nonpregnant women. One drink equals 12 ounces of beer, 5 ounces of wine, or 1 ounces of hard liquor.  Do not use street drugs.  Do not share needles.  Ask your health care provider for help if you need support or information about quitting drugs.  Tell your health care provider if you often feel depressed.  Tell your health care provider if you have ever been abused or do not feel safe at home. This information is not intended to replace advice given to you by your health care provider. Make sure you discuss any questions you have with your health care provider. Document Released: 10/29/2010 Document Revised: 09/21/2015 Document Reviewed: 01/17/2015 Elsevier Interactive Patient Education  2018 [employer] With Depression Everyone experiences occasional disappointment, sadness, and loss in their lives. When you are feeling down, blue, or sad for at least 2 weeks in a row, it may mean that you have depression. Depression can affect your thoughts and feelings, relationships, daily activities, and physical health. It is caused by changes in the way your brain functions. If you receive a diagnosis of depression, your health care provider will tell you which type of depression you have and what treatment options are available to you. If you are living with depression, there are ways to help you recover from it and also ways to prevent it from coming back. How to cope with lifestyle changes Coping with stress Stress is your body's reaction to life changes and events, both good and bad. Stressful situations may include:  Getting married.  The death of a spouse.  Losing a job.  Retiring.  Having a baby.  Stress can last just a few hours or it can be ongoing. Stress can play a major role  in depression, so it is important to learn both how to  cope with stress and how to think about it differently. Talk with your health care provider or a counselor if you would like to learn more about stress reduction. He or she may suggest some stress reduction techniques, such as:  Music therapy. This can include creating music or listening to music. Choose music that you enjoy and that inspires you.  Mindfulness-based meditation. This kind of meditation can be done while sitting or walking. It involves being aware of your normal breaths, rather than trying to control your breathing.  Centering prayer. This is a kind of meditation that involves focusing on a spiritual word or phrase. Choose a word, phrase, or sacred image that is meaningful to you and that brings you peace.  Deep breathing. To do this, expand your stomach and inhale slowly through your nose. Hold your breath for 3-5 seconds, then exhale slowly, allowing your stomach muscles to relax.  Muscle relaxation. This involves intentionally tensing muscles then relaxing them.  Choose a stress reduction technique that fits your lifestyle and personality. Stress reduction techniques take time and practice to develop. Set aside 5-15 minutes a day to do them. Therapists can offer training in these techniques. The training may be covered by some insurance plans. Other things you can do to manage stress include:  Keeping a stress diary. This can help you learn what triggers your stress and ways to control your response.  Understanding what your limits are and saying no to requests or events that lead to a schedule that is too full.  Thinking about how you respond to certain situations. You may not be able to control everything, but you can control how you react.  Adding humor to your life by watching funny films or TV shows.  Making time for activities that help you relax and not feeling guilty about spending your time this  way.  Medicines Your health care provider may suggest certain medicines if he or she feels that they will help improve your condition. Avoid using alcohol and other substances that may prevent your medicines from working properly (may interact). It is also important to:  Talk with your pharmacist or health care provider about all the medicines that you take, their possible side effects, and what medicines are safe to take together.  Make it your goal to take part in all treatment decisions (shared decision-making). This includes giving input on the side effects of medicines. It is best if shared decision-making with your health care provider is part of your total treatment plan.  If your health care provider prescribes a medicine, you may not notice the full benefits of it for 4-8 weeks. Most people who are treated for depression need to be on medicine for at least 6-12 months after they feel better. If you are taking medicines as part of your treatment, do not stop taking medicines without first talking to your health care provider. You may need to have the medicine slowly decreased (tapered) over time to decrease the risk of harmful side effects. Relationships Your health care provider may suggest family therapy along with individual therapy and drug therapy. While there may not be family problems that are causing you to feel depressed, it is still important to make sure your family learns as much as they can about your mental health. Having your family's support can help make your treatment successful. How to recognize changes in your condition Everyone has a different response to treatment for depression. Recovery from major depression happens when you  have not had signs of major depression for two months. This may mean that you will start to:  Have more interest in doing activities.  Feel less hopeless than you did 2 months ago.  Have more energy.  Overeat less often, or have better or  improving appetite.  Have better concentration.  Your health care provider will work with you to decide the next steps in your recovery. It is also important to recognize when your condition is getting worse. Watch for these signs:  Having fatigue or low energy.  Eating too much or too little.  Sleeping too much or too little.  Feeling restless, agitated, or hopeless.  Having trouble concentrating or making decisions.  Having unexplained physical complaints.  Feeling irritable, angry, or aggressive.  Get help as soon as you or your family members notice these symptoms coming back. How to get support and help from others How to talk with friends and family members about your condition Talking to friends and family members about your condition can provide you with one way to get support and guidance. Reach out to trusted friends or family members, explain your symptoms to them, and let them know that you are working with a health care provider to treat your depression. Financial resources Not all insurance plans cover mental health care, so it is important to check with your insurance carrier. If paying for co-pays or counseling services is a problem, search for a local or county mental health care center. They may be able to offer public mental health care services at low or no cost when you are not able to see a private health care provider. If you are taking medicine for depression, you may be able to get the generic form, which may be less expensive. Some makers of prescription medicines also offer help to patients who cannot afford the medicines they need. Follow these instructions at home:  Get the right amount and quality of sleep.  Cut down on using caffeine, tobacco, alcohol, and other potentially harmful substances.  Try to exercise, such as walking or lifting small weights.  Take over-the-counter and prescription medicines only as told by your health care provider.  Eat  a healthy diet that includes plenty of vegetables, fruits, whole grains, low-fat dairy products, and lean protein. Do not eat a lot of foods that are high in solid fats, added sugars, or salt.  Keep all follow-up visits as told by your health care provider. This is important. Contact a health care provider if:  You stop taking your antidepressant medicines, and you have any of these symptoms: ? Nausea. ? Headache. ? Feeling lightheaded. ? Chills and body aches. ? Not being able to sleep (insomnia).  You or your friends and family think your depression is getting worse. Get help right away if:  You have thoughts of hurting yourself or others. If you ever feel like you may hurt yourself or others, or have thoughts about taking your own life, get help right away. You can go to your nearest emergency department or call:  Your local emergency services (911 in the [place]  A suicide crisis helpline, such as the [place] at 718-320-3749. This is open 24-hours a day.  Summary  If you are living with depression, there are ways to help you recover from it and also ways to prevent it from coming back.  Work with your health care team to create a management plan that includes counseling, stress management techniques, and  healthy lifestyle habits. This information is not intended to replace advice given to you by your health care provider. Make sure you discuss any questions you have with your health care provider. Document Released: 03/18/2016 Document Revised: 03/18/2016 Document Reviewed: 03/18/2016 Elsevier Interactive Patient Education  [employer].

## 2017-12-16 LAB — CYTOLOGY - PAP
Diagnosis: NEGATIVE
HPV (WINDOPATH): NOT DETECTED

## 2017-12-22 ENCOUNTER — Ambulatory Visit (INDEPENDENT_AMBULATORY_CARE_PROVIDER_SITE_OTHER): Payer: BLUE CROSS/BLUE SHIELD | Admitting: Psychology

## 2017-12-22 DIAGNOSIS — F321 Major depressive disorder, single episode, moderate: Secondary | ICD-10-CM | POA: Diagnosis not present

## 2017-12-22 DIAGNOSIS — F4323 Adjustment disorder with mixed anxiety and depressed mood: Secondary | ICD-10-CM | POA: Diagnosis not present

## 2018-01-05 ENCOUNTER — Ambulatory Visit: Payer: BLUE CROSS/BLUE SHIELD | Admitting: Psychology

## 2018-01-19 ENCOUNTER — Ambulatory Visit (INDEPENDENT_AMBULATORY_CARE_PROVIDER_SITE_OTHER): Payer: BLUE CROSS/BLUE SHIELD | Admitting: Psychology

## 2018-01-19 DIAGNOSIS — F321 Major depressive disorder, single episode, moderate: Secondary | ICD-10-CM

## 2018-01-20 ENCOUNTER — Ambulatory Visit
Admission: RE | Admit: 2018-01-20 | Discharge: 2018-01-20 | Disposition: A | Discharge status: Home / Self Care | Payer: BLUE CROSS/BLUE SHIELD | Source: Ambulatory Visit | Attending: Nurse Practitioner | Admitting: Nurse Practitioner

## 2018-01-20 DIAGNOSIS — Z1239 Encounter for other screening for malignant neoplasm of breast: Secondary | ICD-10-CM

## 2018-01-20 DIAGNOSIS — Z1231 Encounter for screening mammogram for malignant neoplasm of breast: Secondary | ICD-10-CM | POA: Diagnosis not present

## 2018-01-20 DIAGNOSIS — Z0001 Encounter for general adult medical examination with abnormal findings: Secondary | ICD-10-CM

## 2018-01-21 ENCOUNTER — Other Ambulatory Visit: Payer: Self-pay | Admitting: Nurse Practitioner

## 2018-01-21 DIAGNOSIS — R928 Other abnormal and inconclusive findings on diagnostic imaging of breast: Secondary | ICD-10-CM

## 2018-01-23 ENCOUNTER — Ambulatory Visit
Admission: RE | Admit: 2018-01-23 | Discharge: 2018-01-23 | Disposition: A | Discharge status: Home / Self Care | Payer: BLUE CROSS/BLUE SHIELD | Source: Ambulatory Visit | Attending: Nurse Practitioner | Admitting: Nurse Practitioner

## 2018-01-23 ENCOUNTER — Other Ambulatory Visit: Payer: Self-pay | Admitting: Nurse Practitioner

## 2018-01-23 DIAGNOSIS — N6320 Unspecified lump in the left breast, unspecified quadrant: Secondary | ICD-10-CM

## 2018-01-23 DIAGNOSIS — R928 Other abnormal and inconclusive findings on diagnostic imaging of breast: Secondary | ICD-10-CM

## 2018-01-23 DIAGNOSIS — N6489 Other specified disorders of breast: Secondary | ICD-10-CM | POA: Diagnosis not present

## 2018-01-23 DIAGNOSIS — Z803 Family history of malignant neoplasm of breast: Secondary | ICD-10-CM | POA: Diagnosis not present

## 2018-01-26 ENCOUNTER — Other Ambulatory Visit: Payer: Self-pay | Admitting: Nurse Practitioner

## 2018-01-26 ENCOUNTER — Ambulatory Visit
Admission: RE | Admit: 2018-01-26 | Discharge: 2018-01-26 | Disposition: A | Discharge status: Home / Self Care | Payer: BLUE CROSS/BLUE SHIELD | Source: Ambulatory Visit | Attending: Nurse Practitioner | Admitting: Nurse Practitioner

## 2018-01-26 DIAGNOSIS — N6320 Unspecified lump in the left breast, unspecified quadrant: Secondary | ICD-10-CM

## 2018-01-26 DIAGNOSIS — N6324 Unspecified lump in the left breast, lower inner quadrant: Secondary | ICD-10-CM | POA: Diagnosis not present

## 2018-01-26 DIAGNOSIS — N6012 Diffuse cystic mastopathy of left breast: Secondary | ICD-10-CM | POA: Diagnosis not present

## 2018-02-02 ENCOUNTER — Ambulatory Visit (INDEPENDENT_AMBULATORY_CARE_PROVIDER_SITE_OTHER): Payer: BLUE CROSS/BLUE SHIELD | Admitting: Psychology

## 2018-02-02 DIAGNOSIS — F321 Major depressive disorder, single episode, moderate: Secondary | ICD-10-CM | POA: Diagnosis not present

## 2018-02-16 ENCOUNTER — Ambulatory Visit (INDEPENDENT_AMBULATORY_CARE_PROVIDER_SITE_OTHER): Payer: BLUE CROSS/BLUE SHIELD | Admitting: Psychology

## 2018-02-16 DIAGNOSIS — F331 Major depressive disorder, recurrent, moderate: Secondary | ICD-10-CM | POA: Diagnosis not present

## 2018-02-16 DIAGNOSIS — F4323 Adjustment disorder with mixed anxiety and depressed mood: Secondary | ICD-10-CM | POA: Diagnosis not present

## 2018-03-02 ENCOUNTER — Ambulatory Visit: Payer: BLUE CROSS/BLUE SHIELD | Admitting: Psychology

## 2018-03-16 ENCOUNTER — Ambulatory Visit (INDEPENDENT_AMBULATORY_CARE_PROVIDER_SITE_OTHER): Payer: BLUE CROSS/BLUE SHIELD | Admitting: Psychology

## 2018-03-16 DIAGNOSIS — F321 Major depressive disorder, single episode, moderate: Secondary | ICD-10-CM | POA: Diagnosis not present

## 2018-03-30 ENCOUNTER — Ambulatory Visit: Payer: BLUE CROSS/BLUE SHIELD | Admitting: Psychology

## 2018-04-01 ENCOUNTER — Encounter: Payer: Self-pay | Admitting: *Deleted

## 2018-04-07 DIAGNOSIS — G40009 Localization-related (focal) (partial) idiopathic epilepsy and epileptic syndromes with seizures of localized onset, not intractable, without status epilepticus: Secondary | ICD-10-CM | POA: Diagnosis not present

## 2018-04-23 ENCOUNTER — Ambulatory Visit (INDEPENDENT_AMBULATORY_CARE_PROVIDER_SITE_OTHER): Payer: BLUE CROSS/BLUE SHIELD | Admitting: Psychology

## 2018-04-23 DIAGNOSIS — F321 Major depressive disorder, single episode, moderate: Secondary | ICD-10-CM | POA: Diagnosis not present

## 2018-04-27 ENCOUNTER — Ambulatory Visit: Payer: BLUE CROSS/BLUE SHIELD | Admitting: Psychology

## 2018-05-02 ENCOUNTER — Encounter (HOSPITAL_COMMUNITY): Payer: Self-pay | Admitting: Nurse Practitioner

## 2018-05-02 ENCOUNTER — Inpatient Hospital Stay (HOSPITAL_COMMUNITY)
Admission: EM | Admit: 2018-05-02 | Discharge: 2018-05-04 | DRG: 101 | Discharge status: Against Medical Advice | Payer: BLUE CROSS/BLUE SHIELD | Attending: Family Medicine | Admitting: Family Medicine

## 2018-05-02 DIAGNOSIS — R402362 Coma scale, best motor response, obeys commands, at arrival to emergency department: Secondary | ICD-10-CM | POA: Diagnosis not present

## 2018-05-02 DIAGNOSIS — Z5329 Procedure and treatment not carried out because of patient's decision for other reasons: Secondary | ICD-10-CM | POA: Diagnosis not present

## 2018-05-02 DIAGNOSIS — R402142 Coma scale, eyes open, spontaneous, at arrival to emergency department: Secondary | ICD-10-CM | POA: Diagnosis present

## 2018-05-02 DIAGNOSIS — Z79899 Other long term (current) drug therapy: Secondary | ICD-10-CM

## 2018-05-02 DIAGNOSIS — I10 Essential (primary) hypertension: Secondary | ICD-10-CM | POA: Diagnosis not present

## 2018-05-02 DIAGNOSIS — R32 Unspecified urinary incontinence: Secondary | ICD-10-CM | POA: Diagnosis present

## 2018-05-02 DIAGNOSIS — Z6841 Body Mass Index (BMI) 40.0 and over, adult: Secondary | ICD-10-CM | POA: Diagnosis not present

## 2018-05-02 DIAGNOSIS — F17210 Nicotine dependence, cigarettes, uncomplicated: Secondary | ICD-10-CM | POA: Diagnosis not present

## 2018-05-02 DIAGNOSIS — Z8249 Family history of ischemic heart disease and other diseases of the circulatory system: Secondary | ICD-10-CM

## 2018-05-02 DIAGNOSIS — Z833 Family history of diabetes mellitus: Secondary | ICD-10-CM | POA: Diagnosis not present

## 2018-05-02 DIAGNOSIS — R569 Unspecified convulsions: Secondary | ICD-10-CM | POA: Diagnosis not present

## 2018-05-02 DIAGNOSIS — Z83511 Family history of glaucoma: Secondary | ICD-10-CM

## 2018-05-02 DIAGNOSIS — G40909 Epilepsy, unspecified, not intractable, without status epilepticus: Secondary | ICD-10-CM

## 2018-05-02 DIAGNOSIS — R739 Hyperglycemia, unspecified: Secondary | ICD-10-CM | POA: Diagnosis not present

## 2018-05-02 DIAGNOSIS — Z8261 Family history of arthritis: Secondary | ICD-10-CM | POA: Diagnosis not present

## 2018-05-02 DIAGNOSIS — G4089 Other seizures: Principal | ICD-10-CM | POA: Diagnosis present

## 2018-05-02 DIAGNOSIS — R402252 Coma scale, best verbal response, oriented, at arrival to emergency department: Secondary | ICD-10-CM | POA: Diagnosis present

## 2018-05-02 DIAGNOSIS — Z803 Family history of malignant neoplasm of breast: Secondary | ICD-10-CM | POA: Diagnosis not present

## 2018-05-02 HISTORY — DX: Epilepsy, unspecified, not intractable, without status epilepticus: G40.909

## 2018-05-02 LAB — CBC WITH DIFFERENTIAL/PLATELET
Abs Immature Granulocytes: 0.06 10*3/uL (ref 0.00–0.07)
BASOS ABS: 0 10*3/uL (ref 0.0–0.1)
BASOS PCT: 0 %
Eosinophils Absolute: 0 10*3/uL (ref 0.0–0.5)
Eosinophils Relative: 0 %
HEMATOCRIT: 43.6 % (ref 36.0–46.0)
HEMOGLOBIN: 13.9 g/dL (ref 12.0–15.0)
IMMATURE GRANULOCYTES: 1 %
LYMPHS ABS: 1 10*3/uL (ref 0.7–4.0)
Lymphocytes Relative: 11 %
MCH: 31.3 pg (ref 26.0–34.0)
MCHC: 31.9 g/dL (ref 30.0–36.0)
MCV: 98.2 fL (ref 80.0–100.0)
MONOS PCT: 3 %
Monocytes Absolute: 0.2 10*3/uL (ref 0.1–1.0)
NEUTROS ABS: 7.7 10*3/uL (ref 1.7–7.7)
Neutrophils Relative %: 85 %
Platelets: 240 10*3/uL (ref 150–400)
RBC: 4.44 MIL/uL (ref 3.87–5.11)
RDW: 12.3 % (ref 11.5–15.5)
WBC: 8.9 10*3/uL (ref 4.0–10.5)
nRBC: 0 % (ref 0.0–0.2)

## 2018-05-02 LAB — COMPREHENSIVE METABOLIC PANEL
ALK PHOS: 59 U/L (ref 38–126)
ALT: 15 U/L (ref 0–44)
AST: 26 U/L (ref 15–41)
Albumin: 4 g/dL (ref 3.5–5.0)
Anion gap: 16 — ABNORMAL HIGH (ref 5–15)
BILIRUBIN TOTAL: 0.5 mg/dL (ref 0.3–1.2)
BUN: 13 mg/dL (ref 6–20)
CALCIUM: 8.6 mg/dL — AB (ref 8.9–10.3)
CHLORIDE: 110 mmol/L (ref 98–111)
CO2: 15 mmol/L — ABNORMAL LOW (ref 22–32)
CREATININE: 1.02 mg/dL — AB (ref 0.44–1.00)
GFR calc Af Amer: >60 mL/min (ref >60)
GFR calc non Af Amer: >60 mL/min (ref >60)
GLUCOSE: 140 mg/dL — AB (ref 70–99)
Potassium: 3.8 mmol/L (ref 3.5–5.1)
Sodium: 141 mmol/L (ref 135–145)
Total Protein: 7.2 g/dL (ref 6.5–8.1)

## 2018-05-02 MED ORDER — LEVETIRACETAM IN NACL 1000 MG/100ML IV SOLN
1000.0000 mg | Freq: Once | INTRAVENOUS | Status: AC
  Administered 2018-05-02: 1000 mg via INTRAVENOUS
  Filled 2018-05-02: qty 100

## 2018-05-02 MED ORDER — ZONISAMIDE 100 MG PO CAPS: 300.0000 mg | ORAL_CAPSULE | ORAL | Status: DC

## 2018-05-02 MED ORDER — LORAZEPAM 2 MG/ML IJ SOLN
2.0000 mg | INTRAMUSCULAR | Status: DC | PRN
  Filled 2018-05-02: qty 1

## 2018-05-02 MED ORDER — SODIUM CHLORIDE 0.9 % IV BOLUS
1000.0000 mL | Freq: Once | INTRAVENOUS | Status: AC
  Administered 2018-05-02: 1000 mL via INTRAVENOUS

## 2018-05-02 MED ORDER — ENOXAPARIN SODIUM 60 MG/0.6ML ~~LOC~~ SOLN
60.0000 mg | Freq: Every day | SUBCUTANEOUS | Status: DC
  Administered 2018-05-03: 60 mg via SUBCUTANEOUS
  Filled 2018-05-02 (×2): qty 0.6

## 2018-05-02 MED ORDER — LORAZEPAM 2 MG/ML IJ SOLN
1.0000 mg | Freq: Once | INTRAMUSCULAR | Status: AC
  Administered 2018-05-02: 1 mg via INTRAVENOUS

## 2018-05-02 MED ORDER — LORAZEPAM 2 MG/ML IJ SOLN
INTRAMUSCULAR | Status: AC
  Administered 2018-05-02: 1 mg via INTRAVENOUS
  Filled 2018-05-02: qty 1

## 2018-05-02 MED ORDER — SODIUM CHLORIDE 0.9 % IV SOLN
INTRAVENOUS | Status: DC
  Administered 2018-05-02 – 2018-05-04 (×4): via INTRAVENOUS

## 2018-05-02 NOTE — H&P (Signed)
History and Physical  Kristina Coleman ZOX:[id number] DOB: Apr 09, 1976 DOA: 05/02/2018 1837  Referring physician: Cordelia PocheAllen A [facility]) PCP: Anne NgNche, Charlotte Lum, NP  Outpatient Specialists: [facility] Marnee Spring(Susannah White)  HISTORY   Chief Complaint: recurrent seizures ~ 6 over the past 12 hours  HPI: Kristina Coleman is a 43 y.o. female with hx of epilepsy/seizure disorder who presents with multiple seizures (during sleep) on day of admission. History obtained from her significant other/girlfriend whom patient lives with. Partner reports that patient had been in usual state of health and had been compliant with home zonisamide (last dose prior to admission yesterday evening and also received home dose again while in ED). No infectious sx or other changes from usual routine, except for [place]. Partner reports patient may have a one drink on New Year's Eve. Otherwise denies recent illicit drug use. Around 0440 AM, partner was awakened by seizure like activity by the patient (bilateral upper extremity jerking) which resolved after 2 mins -- typical seizure pattern, per girlfriend. She did have urinary incontinence with the 1st episode; no tongue biting. Patient was reportedly sleepy after the seizure event, which is similar to previous post-ictal behavior but did report a headache. Her girlfriend had then left home for work and then returned at noon where she walked upon the patient in the middle of another seizure. Over the course of the next 5 hours, patient had about 1 event per 1-1.5 hours and had 5 events that were witnessed by the partner.   Per partner and review of outpatient neurology records from [facility] (via [facility]), patient's seizures tend to be nocturnal/during sleep. Last seizure x 1 was in November, and before November she had about 4-5 month seizure free period.  Upon arrival to ED, patient had another witnessed seizure episode described as blank gaze  followed by generalized tonic-clonic movements x 30 seconds. She received IV ativan 1mg  and keppra 1gm load in ED. Currently remains in post-ictal state, but arousable to voice.     Review of Systems:  +witnessed seizures + reported headache earlier today + no known vision changes reported by family - no fevers/chills - no cough - no chest pain, dyspnea on exertion - no edema, PND, orthopnea - no nausea/vomiting; no tarry, melanotic or bloody stools - no dysuria, increased urinary frequency - no weight changes Rest of systems reviewed are negative, except as per above history.   ED course:  Vitals Blood pressure (!) 151/97, pulse 79, temperature 99 F (37.2 C), temperature source Oral, resp. rate 20, SpO2 97 %. Received IV ativan 1mg  x 1; keppra 1000mg  x 1; NS bolus 1L; home zonisamide (not charted) but administered while in ED waiting area at 1900pm.   Past Medical History:  Diagnosis Date  . Anxiety   . History of chicken pox   . Intracranial hypertension   . Morbid obesity (HCC)   . Seizures (HCC) 11/2015  . Tobacco use 12/24/2016   Past Surgical History:  Procedure Laterality Date  . galblader removal    . left knee surgery    . LUMBAR PUNCTURE     fluid removal in brain.   Marland Kitchen. wisdom teeth removal      Social History:  reports that she has been smoking. She has been smoking about 0.25 packs per day. She has never used smokeless tobacco. She reports current alcohol use. She reports current drug use. Drug: Marijuana.  No Known Allergies  Family History  Problem Relation Age of Onset  . Diabetes Mother   . Hypertension Mother   . Arthritis Mother   . Diabetes Father   . Hypertension Father   . Glaucoma Father   . Cataracts Father   . Cancer Sister        breast cancer  . Breast cancer Sister 74      Prior to Admission medications   Medication Sig Start Date End Date Taking? Authorizing Provider  zonisamide (ZONEGRAN) 100 MG capsule Take 3 capsules (300 mg  total) by mouth at bedtime. 10/27/17  Yes Anson Fret, MD    PHYSICAL EXAM   Temp:  [99 F (37.2 C)] 99 F (37.2 C) (01/04 1825) Pulse Rate:  [79-89] 79 (01/04 2100) Resp:  [18-36] 20 (01/04 2100) BP: (123-151)/(67-97) 151/97 (01/04 2100) SpO2:  [96 %-99 %] 97 % (01/04 2100)  BP (!) 151/97   Pulse 79   Temp 99 F (37.2 C) (Oral)   Resp 20   SpO2 97%    GEN obese middle-aged african-american female; sleepy but arousable; follows simple commands; falls asleep when not stimulated; inconsistently answers questions HEENT NCAT EOM intact PERRL; clear oropharynx, no cervical LAD; moist mucus membranes  JVP estimated 5 cm H2O above RA; no HJR ; no carotid bruits b/l ;  CV regular normal rate; quiet S1 and S2; no m/r/g or S3/S4; PMI non displaced; no parasternal heave  RESP CTA b/l; breathing unlabored and symmetric  ABD soft NT ND +normoactive BS  EXT warm throughout b/l; no peripheral edema b/l  PULSES  DP and radials 2+ intact b/l  SKIN/MSK no rashes or lesions  NEURO/PSYCH  Sleepy but arousable for brief periods < ; follows simple 1-step commands; able to state name but not able to stay awake long enough to answer other questions; no gross motor focal deficits; no visual field deficits;    DATA   LABS ON ADMISSION:  Basic Metabolic Panel: Recent Labs  Lab 05/02/18 1949  NA 141  K 3.8  CL 110  CO2 15*  GLUCOSE 140*  BUN 13  CREATININE 1.02*  CALCIUM 8.6*   CBC: Recent Labs  Lab 05/02/18 1949  WBC 8.9  NEUTROABS 7.7  HGB 13.9  HCT 43.6  MCV 98.2  PLT 240   Liver Function Tests: Recent Labs  Lab 05/02/18 1949  AST 26  ALT 15  ALKPHOS 59  BILITOT 0.5  PROT 7.2  ALBUMIN 4.0   No results for input(s): LIPASE, AMYLASE in the last 168 hours. No results for input(s): AMMONIA in the last 168 hours. Coagulation:  No results found for: INR, PROTIME No results found for: PTT Lactic Acid, Venous:  No results found for: LATICACIDVEN Cardiac Enzymes: No  results for input(s): CKTOTAL, CKMB, CKMBINDEX, TROPONINI in the last 168 hours. Urinalysis:  BNP (last 3 results) No results for input(s): PROBNP in the last 8760 hours. CBG: No results for input(s): GLUCAP in the last 168 hours.  Radiological Exams on Admission: No results found.  EKG: admission EKG pending  I have reviewed the patient's previous electronic chart records, labs, and other data.   ASSESSMENT AND PLAN   Assessment: Kristina Coleman is a 43 y.o. female with hx of epilepsy/seizure disorder who presents with multiple seizures (during sleep) on day of admission, unusual from baseline. No recent dose changes/missed doses or other issues preceding these seizures as reported by partner, whom patient lives with, although one change from her usual routine was  the fairly recent road trip to [facility] [place] [facility] in the past 2 days. Per ED discussion with neurology, requesting admission to [facility] for further workup. Currently does not meet criteria for status given at least 1 hour apart from each seizure -- but will monitor closely. Currently sleepy but arousable. Followed by neurology at [facility] (last seen by Dr. Marnee Spring). Was scheduled for EMU admission in February 2020. Of note, her prior brain MRI (at [facility]) reported "constellation of findings which raises concern for idiopathic intracranial hypertension." LP in Jan 2019 then showed opening pressure 26 cm H2O. Also since then had ophthalmologic evaluation in July 2019 that was normal   No visual deficits on my exam (limited by sleepiness) or known vision changes reported by girlfriend.   Active Problems:   Recurrent seizures (HCC)   Plan:   # Multiple seizure events over 12 hours with hx of partial idiopathic seizure disorder > current exam is non-focal; post-ictal state; protecting airway - appreciate neurology recs - admit to medical telemetry to [facility] - s/p keppra load 1000mg  in ED at 21:44 on 05/02/17 - s/p home zonisamide 300mg   dose around 7pm on 05/02/17 - prn IV ativan 2mg  q4h prn seizure - resumed zonisamide 300mg  nightly (adjust as per neuro) - further workup as per neuro (note: previous neuro workup at [facility] in [facility])  - lactate and TSH pending - keep NPO overnight  # HTN on admission; current not anti-HTN meds. > concern for intracranial HTN in chart history based on MR brain findings in 05/13/17 - can spot dose hydralazine IV 10mg  for BP > 175/100 - consider starting CCB + thiazide when patient is more awake for BP control - appreciate neuro recs  # Hyperglycemia on admission, likely reactive after seizure > A1c in 2018 was 5.1 - repeat A1c pending      DVT Prophylaxis: lovenox  Code Status:  Full Code Family Communication: patient and partner at bedside  Disposition Plan: admit to [facility] for neuro workup   Patient contact: Extended Emergency Contact Information Primary Emergency Contact: [facility] Address: [address]          [place], [place] [address] [place] Home Phone: [phone] Relation: Significant other  Time spent: > 35 mins  Ike Bene, MD [facility] Pager [phone]  If 7PM-7AM, please contact night-coverage [email or web address] Password [facility] 05/02/2018, 9:51 PM

## 2018-05-02 NOTE — ED Notes (Signed)
Upon entering room, patient noted to have focal seizure symptoms. Blank gaze, eye twitching with minimal body movement. Patient then began grunting and went into full grand mal activity for approximately 30 seconds. Patient post-ictal with EDP at bedside.

## 2018-05-02 NOTE — ED Triage Notes (Signed)
Pt is presented by s/o who reports that pt has had multiple seizures, about 5 today. Hx of epilepsy and under [facility].

## 2018-05-02 NOTE — ED Provider Notes (Signed)
[facility] Provider Note   CSN: [id number] Arrival date & time: 05/02/18  1803     History   Chief Complaint Chief Complaint  Patient presents with  . Seizures    Multiple    HPI Kristina Coleman is a 43 y.o. female.  43 year old female with history of seizure disorder who is currently taking Zonegran presents after having 5 seizures today witnessed by her girlfriend.  Seizures described as similar to her prior episodes with patient staring off into space and some generalized tonic-clonic activity and occurred only when she sleeps.  Did have loss of bladder function one time.  Has been confused afterwards.  No recent fever or changes to her medications.  No initiating factor to cause this.  Presents here for further management     Past Medical History:  Diagnosis Date  . Anxiety   . History of chicken pox   . Intracranial hypertension   . Morbid obesity (HCC)   . Seizures (HCC) 11/2015  . Tobacco use 12/24/2016    Patient Active Problem List   Diagnosis Date Noted  . Adjustment disorder with mixed anxiety and depressed mood 07/09/2017  . Tobacco use 12/24/2016  . Morbid obesity (HCC)   . Idiopathic intracranial hypertension 12/27/2015  . Partial idiopathic epilepsy with seizures of localized onset, not intractable, with status epilepticus (HCC) 11/28/2015    Past Surgical History:  Procedure Laterality Date  . galblader removal    . left knee surgery    . LUMBAR PUNCTURE     fluid removal in brain.   Marland Kitchen wisdom teeth removal       OB History   No obstetric history on file.      Home Medications    Prior to Admission medications   Medication Sig Start Date End Date Taking? Authorizing Provider  zonisamide (ZONEGRAN) 100 MG capsule Take 3 capsules (300 mg total) by mouth at bedtime. 10/27/17   Anson Fret, MD    Family History Family History  Problem Relation Age of Onset  . Diabetes Mother   . Hypertension Mother     . Arthritis Mother   . Diabetes Father   . Hypertension Father   . Glaucoma Father   . Cataracts Father   . Cancer Sister        breast cancer  . Breast cancer Sister 56    Social History Social History   Tobacco Use  . Smoking status: Light Tobacco Smoker    Packs/day: 0.25  . Smokeless tobacco: Never Used  Substance Use Topics  . Alcohol use: Yes    Comment: on occasion  . Drug use: Yes    Types: Marijuana    Comment: every day     Allergies   Patient has no known allergies.   Review of Systems Review of Systems  All other systems reviewed and are negative.    Physical Exam Updated Vital Signs BP 129/85 (BP Location: Right Arm)   Pulse 83   Temp 99 F (37.2 C) (Oral)   Resp 18   SpO2 99%   Physical Exam Vitals signs and nursing note reviewed.  Constitutional:      General: She is not in acute distress.    Appearance: Normal appearance. She is well-developed. She is not toxic-appearing.  HENT:     Head: Normocephalic and atraumatic.  Eyes:     General: Lids are normal.     Conjunctiva/sclera: Conjunctivae normal.     Pupils: Pupils  are equal, round, and reactive to light.  Neck:     Musculoskeletal: Normal range of motion and neck supple.     Thyroid: No thyroid mass.     Trachea: No tracheal deviation.  Cardiovascular:     Rate and Rhythm: Normal rate and regular rhythm.     Heart sounds: Normal heart sounds. No murmur. No gallop.   Pulmonary:     Effort: Pulmonary effort is normal. No respiratory distress.     Breath sounds: Normal breath sounds. No stridor. No decreased breath sounds, wheezing, rhonchi or rales.  Abdominal:     General: Bowel sounds are normal. There is no distension.     Palpations: Abdomen is soft.     Tenderness: There is no abdominal tenderness. There is no rebound.  Musculoskeletal: Normal range of motion.        General: No tenderness.  Skin:    General: Skin is warm and dry.     Findings: No abrasion or rash.   Neurological:     Mental Status: She is oriented to person, place, and time. She is lethargic.     GCS: GCS eye subscore is 4. GCS verbal subscore is 5. GCS motor subscore is 6.     Cranial Nerves: No cranial nerve deficit or dysarthria.     Sensory: No sensory deficit.     Motor: No weakness or pronator drift.     Coordination: Finger-Nose-Finger Test normal.  Psychiatric:        Attention and Perception: She is inattentive.        Mood and Affect: Affect is flat.        Speech: Speech is delayed.        Behavior: Behavior is slowed.      ED Treatments / Results  Labs (all labs ordered are listed, but only abnormal results are displayed) Labs Reviewed  CBC WITH DIFFERENTIAL/PLATELET  COMPREHENSIVE METABOLIC PANEL    EKG None  Radiology No results found.  Procedures Procedures (including critical care time)  Medications Ordered in ED Medications - No data to display   Initial Impression / Assessment and Plan / ED Course  I have reviewed the triage vital signs and the nursing notes.  Pertinent labs & imaging results that were available during my care of the patient were reviewed by me and considered in my medical decision making (see chart for details).     Patient monitored here and did have generalized tonic-clonic activity was postictal period.  Given Ativan and she is resting at this time.  This makes the sixth seizure that she is had.  Discussed with Dr. Otelia LimesLindzen from neurology recommends patient loaded with Keppra 1 g and [facility].  Will contact the hospitalist service  CRITICAL CARE Performed by: Toy BakerAnthony T Korvin Valentine Total critical care time: 50 minutes Critical care time was exclusive of separately billable procedures and treating other patients. Critical care was necessary to treat or prevent imminent or life-threatening deterioration. Critical care was time spent personally by me on the following activities: development of  treatment plan with patient and/or surrogate as well as nursing, discussions with consultants, evaluation of patient's response to treatment, examination of patient, obtaining history from patient or surrogate, ordering and performing treatments and interventions, ordering and review of laboratory studies, ordering and review of radiographic studies, pulse oximetry and re-evaluation of patient's condition.   Final Clinical Impressions(s) / ED Diagnoses   Final diagnoses:  None    ED  Discharge Orders    None       Lorre Nick, MD 05/02/18 2051

## 2018-05-02 NOTE — ED Notes (Signed)
Bed: WA06 Expected date:  Expected time:  Means of arrival:  Comments: Triage  

## 2018-05-03 ENCOUNTER — Encounter (HOSPITAL_COMMUNITY): Payer: Self-pay | Admitting: *Deleted

## 2018-05-03 ENCOUNTER — Other Ambulatory Visit: Payer: Self-pay

## 2018-05-03 DIAGNOSIS — G40909 Epilepsy, unspecified, not intractable, without status epilepticus: Secondary | ICD-10-CM

## 2018-05-03 LAB — MAGNESIUM: Magnesium: 2 mg/dL (ref 1.7–2.4)

## 2018-05-03 LAB — CBC WITH DIFFERENTIAL/PLATELET
Abs Immature Granulocytes: 0.03 10*3/uL (ref 0.00–0.07)
BASOS PCT: 0 %
Basophils Absolute: 0 10*3/uL (ref 0.0–0.1)
Eosinophils Absolute: 0 10*3/uL (ref 0.0–0.5)
Eosinophils Relative: 0 %
HCT: 36.8 % (ref 36.0–46.0)
Hemoglobin: 11.9 g/dL — ABNORMAL LOW (ref 12.0–15.0)
Immature Granulocytes: 0 %
Lymphocytes Relative: 11 %
Lymphs Abs: 1.1 10*3/uL (ref 0.7–4.0)
MCH: 30.9 pg (ref 26.0–34.0)
MCHC: 32.3 g/dL (ref 30.0–36.0)
MCV: 95.6 fL (ref 80.0–100.0)
Monocytes Absolute: 0.4 10*3/uL (ref 0.1–1.0)
Monocytes Relative: 5 %
NEUTROS ABS: 7.9 10*3/uL — AB (ref 1.7–7.7)
Neutrophils Relative %: 84 %
Platelets: 208 10*3/uL (ref 150–400)
RBC: 3.85 MIL/uL — AB (ref 3.87–5.11)
RDW: 12.3 % (ref 11.5–15.5)
WBC: 9.5 10*3/uL (ref 4.0–10.5)
nRBC: 0 % (ref 0.0–0.2)

## 2018-05-03 LAB — BASIC METABOLIC PANEL
Anion gap: 9 (ref 5–15)
BUN: 12 mg/dL (ref 6–20)
CO2: 18 mmol/L — ABNORMAL LOW (ref 22–32)
Calcium: 8.3 mg/dL — ABNORMAL LOW (ref 8.9–10.3)
Chloride: 112 mmol/L — ABNORMAL HIGH (ref 98–111)
Creatinine, Ser: 0.71 mg/dL (ref 0.44–1.00)
GFR calc Af Amer: >60 mL/min (ref >60)
GFR calc non Af Amer: >60 mL/min (ref >60)
GLUCOSE: 121 mg/dL — AB (ref 70–99)
Potassium: 3.5 mmol/L (ref 3.5–5.1)
Sodium: 139 mmol/L (ref 135–145)

## 2018-05-03 LAB — HIV ANTIBODY (ROUTINE TESTING W REFLEX): HIV Screen 4th Generation wRfx: NONREACTIVE

## 2018-05-03 LAB — TSH: TSH: 0.372 u[IU]/mL (ref 0.350–4.500)

## 2018-05-03 LAB — HEMOGLOBIN A1C
Hgb A1c MFr Bld: 4.6 % — ABNORMAL LOW (ref 4.8–5.6)
Mean Plasma Glucose: 85.32 mg/dL

## 2018-05-03 LAB — LACTIC ACID, PLASMA: Lactic Acid, Venous: 1.1 mmol/L (ref 0.5–1.9)

## 2018-05-03 MED ORDER — ZONISAMIDE 100 MG PO CAPS
400.0000 mg | ORAL_CAPSULE | ORAL | Status: DC
  Administered 2018-05-03: 400 mg via ORAL
  Filled 2018-05-03 (×2): qty 4

## 2018-05-03 MED ORDER — HYDROMORPHONE HCL 1 MG/ML IJ SOLN: 0.5000 mg | INTRAMUSCULAR | Status: DC | PRN

## 2018-05-03 MED ORDER — ACETAMINOPHEN 325 MG PO TABS
650.0000 mg | ORAL_TABLET | ORAL | Status: DC | PRN
  Administered 2018-05-03: 650 mg via ORAL
  Filled 2018-05-03: qty 2

## 2018-05-03 NOTE — Progress Notes (Signed)
Patient transferred from [facility] to [facility] 5w bed 07. Alert and oriented x 4. IVF continued. Patient up x stand by assistance. Vital signs stable. No complaints. Patient educated on unit expectations. Call bell within reach. Will continue to monitor.

## 2018-05-03 NOTE — Progress Notes (Signed)
Lovenox per Pharmacy for DVT Prophylaxis    Pharmacy has been consulted from dosing enoxaparin (lovenox) in this patient for DVT prophylaxis.  The pharmacist has reviewed pertinent labs (Hgb _13.9__; PLT_240__), patient weight (__126_kg) and renal function (CrCl_>90__mL/min) and decided that enoxaparin _60_mg SQ Q24Hrs is appropriate for this patient.  The pharmacy department will sign off at this time.  Please reconsult pharmacy if status changes or for further issues.  Thank you  Luetta Nutting PharmD, BCPS  05/03/2018, 12:26 AM

## 2018-05-03 NOTE — Progress Notes (Signed)
[facility]                                                                              Patient Demographics  Kristina GuadalajaraLatte Train, is a 43 y.o. female, DOB - 28-Feb-1976, ZOX:[id number]  Admit date - 05/02/2018   Admitting Physician Ike Benearolyn J Park, MD  Outpatient Primary MD for the patient is Nche, Bonna Gainsharlotte Lum, NP  Outpatient specialists:   LOS - 1  days   Medical records reviewed and are as summarized below:    Chief Complaint  Patient presents with  . Seizures    Multiple       Brief summary  Patient is a 43 year old female with history of epilepsy/seizure disorder presented with multiple seizures on the day of admission.  Patient was in her usual state of health and had been compliant with home zonisamide.  No recent acute illness.  In ED, patient had another witnessed seizure episode, described as blank gaze followed by generalized tonic-clonic movements.  She received IV Ativan and Keppra 1 g loading ED.  Neurology was consulted.   Assessment & Plan   Principal problem Recurrent/multiple seizures with underlying history of partial idiopathic seizure disorder -Patient received Keppra 1 g loading dose in ED with IV Ativan for the witnessed seizure -Neurology was consulted, patient restarted on zonisamide, increase the dose to 400 mg daily.  Per neurology if this is ineffective, will add scheduled Keppra  -Recommended EEG, [facility].  Active Problems: Hypertension BP currently soft, not on any antihypertensives at home  Hyperglycemia on admission Likely due to seizures, hemoglobin A1c 4.6  Code Status: Full code DVT Prophylaxis:  Lovenox  Family Communication: Discussed in detail with the patient, all imaging results, lab results explained to the patient and family member in the room   Disposition Plan: Transfer to [facility], needs EEG  Time Spent in minutes 25 minutes  Procedures:    Consultants:    Neurology  Antimicrobials:      Medications  Scheduled Meds: . enoxaparin (LOVENOX) injection  60 mg Subcutaneous QHS  . zonisamide  400 mg Oral Q24H   Continuous Infusions: . sodium chloride 125 mL/hr at 05/03/18 0743   PRN Meds:.LORazepam   Antibiotics   Anti-infectives (From admission, onward)   None        Subjective:   Kristina Coleman was seen and examined today.  Still slightly postictal at the time of my examination, knows month of January, oriented to self.  Patient denies dizziness, chest pain, shortness of breath, abdominal pain, N/V/D/C, new weakness, numbess, tingling.  Objective:   Vitals:   05/03/18 0022 05/03/18 0027 05/03/18 0402 05/03/18 0500  BP:  (!) 138/93 (!) 106/58   Pulse:  82 91   Resp:  20 19   Temp:  98.4 F (36.9 C) 100.2 F (37.9 C) 99.7 F (37.6 C)  TempSrc:  Oral Oral Oral  SpO2:  100% 100%   Weight: 126.1 kg     Height: 5\' 6"  (1.676 m)       Intake/Output Summary (Last 24 hours) at 05/03/2018 1218 Last data filed at 05/03/2018 0600 Gross per  24 hour  Intake 2050.6 ml  Output -  Net 2050.6 ml     Wt Readings from Last 3 Encounters:  05/03/18 126.1 kg  12/15/17 120.7 kg  11/19/17 122.8 kg     Exam  General: Alert and awake, slightly still disoriented  Eyes:   HEENT:  Atraumatic, normocephalic, normal oropharynx  Cardiovascular: S1 S2 auscultated,  Regular rate and rhythm.  Respiratory: Clear to auscultation bilaterally, no wheezing, rales or rhonchi  Gastrointestinal: Soft, nontender, nondistended, + bowel sounds  Ext: no pedal edema bilaterally  Neuro: No new deficit  Musculoskeletal: No digital cyanosis, clubbing  Skin: No rashes  Psych: Alert and awake, slightly still disoriented   Data Reviewed:  I have personally reviewed following labs and imaging studies  Micro Results No results found for this or any previous visit (from the past 240 hour(s)).  Radiology Reports No results found.  Lab  Data:  CBC: Recent Labs  Lab 05/02/18 1949 05/03/18 0125  WBC 8.9 9.5  NEUTROABS 7.7 7.9*  HGB 13.9 11.9*  HCT 43.6 36.8  MCV 98.2 95.6  PLT 240 208   Basic Metabolic Panel: Recent Labs  Lab 05/02/18 1949 05/03/18 0125  NA 141 139  K 3.8 3.5  CL 110 112*  CO2 15* 18*  GLUCOSE 140* 121*  BUN 13 12  CREATININE 1.02* 0.71  CALCIUM 8.6* 8.3*  MG  --  2.0   GFR: Estimated Creatinine Clearance: 125.6 mL/min (by C-G formula based on SCr of 0.71 mg/dL). Liver Function Tests: Recent Labs  Lab 05/02/18 1949  AST 26  ALT 15  ALKPHOS 59  BILITOT 0.5  PROT 7.2  ALBUMIN 4.0   No results for input(s): LIPASE, AMYLASE in the last 168 hours. No results for input(s): AMMONIA in the last 168 hours. Coagulation Profile: No results for input(s): INR, PROTIME in the last 168 hours. Cardiac Enzymes: No results for input(s): CKTOTAL, CKMB, CKMBINDEX, TROPONINI in the last 168 hours. BNP (last 3 results) No results for input(s): PROBNP in the last 8760 hours. HbA1C: Recent Labs    05/03/18 0125  HGBA1C 4.6*   CBG: No results for input(s): GLUCAP in the last 168 hours. Lipid Profile: No results for input(s): CHOL, HDL, LDLCALC, TRIG, CHOLHDL, LDLDIRECT in the last 72 hours. Thyroid Function Tests: Recent Labs    05/03/18 0125  TSH 0.372   Anemia Panel: No results for input(s): VITAMINB12, FOLATE, FERRITIN, TIBC, IRON, RETICCTPCT in the last 72 hours. Urine analysis:    Component Value Date/Time   COLORURINE AMBER (A) 12/19/2015 0625   APPEARANCEUR TURBID (A) 12/19/2015 0625   LABSPEC 1.029 12/19/2015 0625   PHURINE 5.0 12/19/2015 0625   GLUCOSEU NEGATIVE 12/19/2015 0625   HGBUR LARGE (A) 12/19/2015 0625   BILIRUBINUR NEGATIVE 12/19/2015 0625   KETONESUR NEGATIVE 12/19/2015 0625   PROTEINUR 100 (A) 12/19/2015 0625   UROBILINOGEN 0.2 05/22/2013 1401   NITRITE NEGATIVE 12/19/2015 0625   LEUKOCYTESUR SMALL (A) 12/19/2015 6759     Tawni Melkonian M.D. [facility] 05/03/2018, 12:18 PM  Pager: [phone] Between 7am to 7pm - call Pager - [phone]  After 7pm go to [email or web address] - password [email or web address]  Call night coverage person covering after 7pm

## 2018-05-03 NOTE — Consult Note (Signed)
NEURO HOSPITALIST CONSULT NOTE   Requestig physician: Dr. Willaim Bane  Reason for Consult: Breakthrough seizures on Zonegran  History obtained from:   Chart     HPI:                                                                                                                                          Kristina Coleman is a 43 y.o. female who presented to the [facility] ED on Saturday evening after having had 5 breakthrough seizures at home, which were witnessed by her girlfriend. The seizures were described as being similar to prior episodes, with staring off into space and GTC activity. The seizures only occur during sleep. She had loss of bladder control with one of the seizures, also biting her tongue. She was confused after the seizures at home. She has a history of epilepsy which is treated with Zonegran. She is seen by [facility]rology. She states that she has been compliant with her medication regimen. There has been no recent change to her anticonvulsant dose and no fever or other evidence for infection. Her last seizure was in November.   In the ED she had a 6th seizure with GTC activity, described by RN as follows: "Upon entering room, patient noted to have focal seizure symptoms. Blank gaze, eye twitching with minimal body movement. Patient then began grunting and went into full grand mal activity for approximately 30 seconds. Patient post-ictal with EDP at bedside."   She was administered Ativan and also loaded with Keppra 1000 mg IV after the 6th seizure, with no further spells.   Past Medical History:  Diagnosis Date  . Anxiety   . History of chicken pox   . Intracranial hypertension   . Morbid obesity (HCC)   . Seizures (HCC) 11/2015  . Tobacco use 12/24/2016    Past Surgical History:  Procedure Laterality Date  . galblader removal    . left knee surgery    . LUMBAR PUNCTURE     fluid removal in brain.   Marland Kitchen wisdom teeth removal      Family History  Problem Relation  Age of Onset  . Diabetes Mother   . Hypertension Mother   . Arthritis Mother   . Diabetes Father   . Hypertension Father   . Glaucoma Father   . Cataracts Father   . Cancer Sister        breast cancer  . Breast cancer Sister 8              Social History:  reports that she has been smoking. She has been smoking about 0.25 packs per day. She has never used smokeless tobacco. She reports current alcohol use. She reports current drug use. Drug: Marijuana.  No Known Allergies  MEDICATIONS:  Prior to Admission:  Medications Prior to Admission  Medication Sig Dispense Refill Last Dose  . zonisamide (ZONEGRAN) 100 MG capsule Take 3 capsules (300 mg total) by mouth at bedtime. 90 capsule 0 05/02/2018 at Unknown time   Scheduled: . enoxaparin (LOVENOX) injection  60 mg Subcutaneous QHS  . zonisamide  300 mg Oral Q24H   Continuous: . sodium chloride 125 mL/hr at 05/02/18 2216     ROS:                                                                                                                                       Unable to obtain due to somnolence/postictal state.    Blood pressure (!) 138/93, pulse 82, temperature 98.4 F (36.9 C), temperature source Oral, resp. rate 20, height 5\' 6"  (1.676 m), weight 126.1 kg, SpO2 100 %.   General Examination:                                                                                                       Physical Exam  General: Morbidly obese HEENT-  /AT    Lungs- Respirations unlabored Extremities- Warm and well perfused  Neurological Examination Mental Status: Somnolent. Will arouse to repeated tactile and verbal stimulation to a drowsy, confused state. Able to state her name and follow some commands. Rapidly falls back to sleep.  Cranial Nerves: II: Pupils equal. Quickly closes eyes to penlight - not cooperative  for long enough to assess reactivity. Will briefly gaze at examiner. Not cooperative with visual field testing.  III,IV, VI: No ptosis. Eyes are conjugate. Does not cooperate with testing of EOM.  V,VII: Not cooperative with sensory testing. Face symmetric.  VIII: hearing intact to voice IX,X: Phonation intact XI: Not cooperative with assessment XII: midline tongue extension Motor: Moves all 4 extremities to command and will resist examiner with 4/5 biceps and 4/5 quadriceps in the context of poor effort. No asymmetry noted.  Sensory: States she can feel FT bilaterally.  Deep Tendon Reflexes: 1+ bilateral upper extremities and patellae Cerebellar: Not able to follow commands for cerebellar exam Gait: Deferred   Lab Results: Basic Metabolic Panel: Recent Labs  Lab 05/02/18 1949 05/03/18 0125  NA 141 139  K 3.8 3.5  CL 110 112*  CO2 15* 18*  GLUCOSE 140* 121*  BUN 13 12  CREATININE 1.02* 0.71  CALCIUM 8.6* 8.3*  MG  --  2.0    CBC: Recent Labs  Lab 05/02/18 1949 05/03/18 0125  WBC 8.9 9.5  NEUTROABS 7.7 7.9*  HGB 13.9 11.9*  HCT 43.6 36.8  MCV 98.2 95.6  PLT 240 208    Cardiac Enzymes: No results for input(s): CKTOTAL, CKMB, CKMBINDEX, TROPONINI in the last 168 hours.  Lipid Panel: No results for input(s): CHOL, TRIG, HDL, CHOLHDL, VLDL, LDLCALC in the last 168 hours.  Imaging: No results found.   Assessment: 43 year old female with breakthrough seizures.  1. She is postictal during Neurological exam, with no lateralizing findings.  2. On Zonisamide 300 mg po qhs at home. She has been compliant.  3. Weight gain of approximately 10 lbs since the Summer. May need an increase in her Zonegran dosing due to this as well as her breakthrough seizures.   Recommendations: 1. She has been loaded with Keppra 1000 mg. Hold off on further Keppra for now.  2. Zonegran dosing increased to 400 mg po qd. If this is ineffective, consider adding scheduled Keppra to her  regimen 3. EEG    Electronically signed: Dr. Caryl Pina 05/03/2018, 2:33 AM

## 2018-05-04 ENCOUNTER — Other Ambulatory Visit (HOSPITAL_COMMUNITY): Payer: Self-pay

## 2018-05-04 DIAGNOSIS — R569 Unspecified convulsions: Secondary | ICD-10-CM

## 2018-05-04 NOTE — Progress Notes (Signed)
Patient refused Lovenox this shift. SCDs offered but pt  reported it makes her awake . Education given on importance of compliance with medication therapy

## 2018-05-04 NOTE — Discharge Summary (Addendum)
Pt left against medical advice prior to me being able to see her (I was not aware she had left until I got to her room). Neurology had recommended zonegran at 400 mg nightly.   I called the patient to see if she needed Jayvian Escoe refill of her zonegran (she stated she did not). Discussed taking higher dose as recommended by neurology. Also discussed seizure precautions with her.   She became upset at one point, asking why the EEG wasn't done and asking why we had not spoken to her neurologist.  She said if anything happened to her, the ppl at [facility] should check their licenses.     I reiterated our neurologists recommendations. She denied have any other questions.
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
## 2018-05-04 NOTE — Progress Notes (Signed)
Date: 05/04/2018 Patient: Kristina Coleman Admitted: 05/02/2018  6:37 PM Attending Provider: No att. providers found  Kristina Coleman or her authorized caregiver has made the decision for the patient to leave the hospital against the advice of Dr. Lowell Guitar.  She or her authorized caregiver has been informed and understands the inherent risks, including death.  She or her authorized caregiver has decided to accept the responsibility for this decision. Kristina Coleman and all necessary parties have been advised that she may return for further evaluation or treatment. Her condition at time of discharge was Stable.  Kristina Coleman had current vital signs as follows:  Blood pressure 103/61, pulse 78, temperature 99.4 F (37.4 C), temperature source Oral, resp. rate 20, height 5\' 6"  (1.676 m), weight 126.4 kg, SpO2 100 %.   Kristina Coleman or her authorized caregiver has signed the Leaving Against Medical Advice form prior to leaving the department.  Kristina Coleman 05/04/2018

## 2018-05-04 NOTE — Progress Notes (Signed)
Ms  Skalski refused  IV fluids this morning. Pt was receiving NS @125 . Pt stated the fluids is making her to make frequent visits to the bathroom. Education given but pt remained adamant. Lowell Guitar MD notified

## 2018-05-04 NOTE — Discharge Instructions (Signed)
Seizure, Adult °When you have a seizure: °· Parts of your body may move. °· You may have a change in how aware or awake (conscious) you are. °· You may shake (convulse). °Seizures usually last from 30 seconds to 2 minutes. Usually, they are not harmful unless they last a long time. °What are the signs or symptoms? °Common symptoms of this condition include: °· Shaking (convulsions). °· Stiffness in the body. °· Passing out (losing consciousness). °· Uncontrolled movements in the: °? Arms or legs. °? Eyes. °? Head. °? Mouth. °Some people have symptoms right before a seizure happens. These symptoms may include: °· Fear. °· Worry (anxiety). °· Feeling like you are going to throw up (nausea). °· Feeling like the room is spinning (vertigo). °· Feeling like you saw or heard something before (déjà vu). °· Odd tastes or smells. °· Changes in vision, such as seeing flashing lights or spots. °Follow these instructions at home: °Medicines ° °· Take over-the-counter and prescription medicines only as told by your doctor. °· Do not eat or drink anything that may keep your medicine from working, such as alcohol. °Activity °· Do not do any activities that would be dangerous if you had another seizure, like driving or swimming. Wait until your doctor says it is safe for you to do them. °· If you live in the [place] ask your local DMV (department of motor vehicles) when you can drive. °· Get plenty of rest. °Teaching others ° °· Teach friends and family what to do when you have a seizure. They should: °? Lay you on the ground. °? Protect your head and body. °? Loosen any tight clothing around your neck. °? Turn you on your side. °? Not hold you down. °? Not put anything into your mouth. °? Know whether or not you need emergency care. °? Stay with you until you are better. °General instructions °· Contact your doctor each time you have a seizure. °· Avoid anything that gives you seizures. °· Keep a seizure diary. Write down: °? What  you think caused each seizure. °? What you remember about each seizure. °· Keep all follow-up visits as told by your doctor. This is important. °Contact a doctor if: °· You have another seizure. °· You have seizures more often. °· There is any change in what happens during your seizures. °· You keep having seizures with treatment. °· You have symptoms of being sick or having an infection. °Get help right away if: °· You have a seizure: °? That lasts longer than 5 minutes. °? That is different than seizures you had before. °? That makes it harder to breathe. °? After you hurt your head. °· After a seizure, you cannot speak or use a part of your body. °· After a seizure, you are confused or have a bad headache. °· You have two or more seizures in a row. °· You are having seizures more often. °· You do not wake up right after a seizure. °· You get hurt during a seizure. °In an emergency: °· These symptoms may be an emergency. Do not wait to see if the symptoms will go away. Get medical help right away. Call your local emergency services (911 in the [place] Do not drive yourself to the hospital. °Summary °· Seizures usually last from 30 seconds to 2 minutes. Usually, they are not harmful unless they last a long time. °· Do not eat or drink anything that may keep your medicine from working, such as alcohol. °·   Teach friends and family what to do when you have a seizure. °· Contact your doctor each time you have a seizure. °This information is not intended to replace advice given to you by your health care provider. Make sure you discuss any questions you have with your health care provider. °Document Released: 10/02/2007 Document Revised: 01/07/2018 Document Reviewed: 05/22/2017 °Elsevier Interactive Patient Education © 2019 [employer] ° °

## 2018-05-11 ENCOUNTER — Ambulatory Visit: Payer: BLUE CROSS/BLUE SHIELD | Admitting: Psychology

## 2018-05-25 ENCOUNTER — Ambulatory Visit: Payer: BLUE CROSS/BLUE SHIELD | Admitting: Psychology

## 2018-06-08 ENCOUNTER — Ambulatory Visit: Payer: BLUE CROSS/BLUE SHIELD | Admitting: Psychology

## 2018-06-22 ENCOUNTER — Ambulatory Visit: Payer: BLUE CROSS/BLUE SHIELD | Admitting: Psychology

## 2018-07-06 ENCOUNTER — Ambulatory Visit: Payer: BLUE CROSS/BLUE SHIELD | Admitting: Psychology

## 2018-07-20 ENCOUNTER — Ambulatory Visit: Payer: BLUE CROSS/BLUE SHIELD | Admitting: Psychology

## 2018-07-31 ENCOUNTER — Ambulatory Visit: Payer: Self-pay | Admitting: Nurse Practitioner

## 2018-08-11 MED ORDER — SODIUM CHLORIDE FLUSH 0.9 % IV SOLN
5.00 | INTRAVENOUS | Status: DC
Start: 2018-08-11 — End: 2018-08-11

## 2018-08-17 ENCOUNTER — Ambulatory Visit: Payer: BLUE CROSS/BLUE SHIELD | Admitting: Psychology

## 2019-07-05 ENCOUNTER — Other Ambulatory Visit: Payer: Self-pay | Admitting: Internal Medicine

## 2019-07-05 DIAGNOSIS — N6320 Unspecified lump in the left breast, unspecified quadrant: Secondary | ICD-10-CM

## 2019-07-06 ENCOUNTER — Other Ambulatory Visit: Payer: Self-pay | Admitting: Internal Medicine

## 2019-07-06 DIAGNOSIS — N6320 Unspecified lump in the left breast, unspecified quadrant: Secondary | ICD-10-CM

## 2019-12-30 ENCOUNTER — Ambulatory Visit
Admission: RE | Admit: 2019-12-30 | Discharge: 2019-12-30 | Disposition: A | Discharge status: Home / Self Care | Payer: Medicare Other | Source: Ambulatory Visit | Attending: Internal Medicine | Admitting: Internal Medicine

## 2019-12-30 ENCOUNTER — Other Ambulatory Visit: Payer: Self-pay

## 2019-12-30 DIAGNOSIS — Z1231 Encounter for screening mammogram for malignant neoplasm of breast: Secondary | ICD-10-CM | POA: Diagnosis not present

## 2019-12-30 DIAGNOSIS — N6320 Unspecified lump in the left breast, unspecified quadrant: Secondary | ICD-10-CM | POA: Diagnosis present

## 2021-02-20 ENCOUNTER — Encounter (HOSPITAL_COMMUNITY): Payer: Self-pay

## 2021-02-20 ENCOUNTER — Emergency Department (HOSPITAL_COMMUNITY)
Admission: EM | Admit: 2021-02-20 | Discharge: 2021-02-20 | Disposition: A | Discharge status: Home / Self Care | Payer: Medicare Other | Attending: Emergency Medicine | Admitting: Emergency Medicine

## 2021-02-20 DIAGNOSIS — S060X0A Concussion without loss of consciousness, initial encounter: Secondary | ICD-10-CM | POA: Insufficient documentation

## 2021-02-20 DIAGNOSIS — Y92410 Unspecified street and highway as the place of occurrence of the external cause: Secondary | ICD-10-CM | POA: Diagnosis not present

## 2021-02-20 DIAGNOSIS — F17210 Nicotine dependence, cigarettes, uncomplicated: Secondary | ICD-10-CM | POA: Diagnosis not present

## 2021-02-20 DIAGNOSIS — S0990XA Unspecified injury of head, initial encounter: Secondary | ICD-10-CM | POA: Diagnosis present
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
## 2021-02-27 ENCOUNTER — Other Ambulatory Visit: Payer: Self-pay

## 2021-02-27 ENCOUNTER — Encounter: Payer: Self-pay | Admitting: Family Medicine

## 2021-02-27 ENCOUNTER — Ambulatory Visit (INDEPENDENT_AMBULATORY_CARE_PROVIDER_SITE_OTHER): Payer: Medicare Other | Admitting: Family Medicine

## 2021-02-27 ENCOUNTER — Ambulatory Visit (INDEPENDENT_AMBULATORY_CARE_PROVIDER_SITE_OTHER): Payer: Medicare Other

## 2021-02-27 ENCOUNTER — Ambulatory Visit: Payer: Self-pay

## 2021-02-27 VITALS — BP 104/72 | HR 84 | Ht 66.0 in | Wt 260.6 lb

## 2021-02-27 DIAGNOSIS — M25561 Pain in right knee: Secondary | ICD-10-CM | POA: Diagnosis not present

## 2021-02-27 DIAGNOSIS — M170 Bilateral primary osteoarthritis of knee: Secondary | ICD-10-CM | POA: Diagnosis not present

## 2021-02-27 DIAGNOSIS — M25562 Pain in left knee: Secondary | ICD-10-CM

## 2021-02-27 DIAGNOSIS — G8929 Other chronic pain: Secondary | ICD-10-CM | POA: Diagnosis not present

## 2021-02-27 MED ORDER — DICLOFENAC SODIUM 1 % EX GEL
4.0000 g | Freq: Four times a day (QID) | CUTANEOUS | 11 refills | Status: AC
Start: 2021-02-27 — End: ?

## 2021-02-27 NOTE — Patient Instructions (Addendum)
Nice to meet you today.  Please get an Xray today before you leave.  Follow-up:6 weeks with me.   Use the voltaren gel on the knees.   We can do more if needed.   It seems the the concussion symptoms have improve a lot.   Try to establish with a PCP in the area.

## 2021-02-28 NOTE — Progress Notes (Signed)
Left knee x-ray shows medium arthritis in the knee.

## 2021-02-28 NOTE — Progress Notes (Signed)
Right knee shows mild arthritis at least in the knee.

## 2021-03-08 ENCOUNTER — Other Ambulatory Visit: Payer: Self-pay

## 2021-03-08 ENCOUNTER — Ambulatory Visit: Payer: Medicare Other | Attending: Family Medicine

## 2021-03-08 DIAGNOSIS — M6281 Muscle weakness (generalized): Secondary | ICD-10-CM

## 2021-03-08 DIAGNOSIS — M25662 Stiffness of left knee, not elsewhere classified: Secondary | ICD-10-CM | POA: Diagnosis present

## 2021-03-08 DIAGNOSIS — M25562 Pain in left knee: Secondary | ICD-10-CM | POA: Diagnosis present

## 2021-03-08 DIAGNOSIS — R262 Difficulty in walking, not elsewhere classified: Secondary | ICD-10-CM

## 2021-03-08 DIAGNOSIS — M25661 Stiffness of right knee, not elsewhere classified: Secondary | ICD-10-CM

## 2021-03-08 DIAGNOSIS — G8929 Other chronic pain: Secondary | ICD-10-CM

## 2021-03-08 DIAGNOSIS — M25561 Pain in right knee: Secondary | ICD-10-CM | POA: Insufficient documentation
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
## 2021-03-20 ENCOUNTER — Ambulatory Visit: Payer: Medicare Other

## 2021-03-20 ENCOUNTER — Other Ambulatory Visit: Payer: Self-pay

## 2021-03-20 DIAGNOSIS — M25562 Pain in left knee: Secondary | ICD-10-CM | POA: Diagnosis not present

## 2021-03-20 DIAGNOSIS — G8929 Other chronic pain: Secondary | ICD-10-CM

## 2021-03-20 DIAGNOSIS — M25661 Stiffness of right knee, not elsewhere classified: Secondary | ICD-10-CM

## 2021-03-20 DIAGNOSIS — M25662 Stiffness of left knee, not elsewhere classified: Secondary | ICD-10-CM

## 2021-03-20 DIAGNOSIS — M6281 Muscle weakness (generalized): Secondary | ICD-10-CM

## 2021-03-20 DIAGNOSIS — R262 Difficulty in walking, not elsewhere classified: Secondary | ICD-10-CM
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
## 2021-03-27 ENCOUNTER — Other Ambulatory Visit: Payer: Self-pay

## 2021-03-27 ENCOUNTER — Ambulatory Visit: Payer: Medicare Other

## 2021-03-27 DIAGNOSIS — M6281 Muscle weakness (generalized): Secondary | ICD-10-CM

## 2021-03-27 DIAGNOSIS — R262 Difficulty in walking, not elsewhere classified: Secondary | ICD-10-CM

## 2021-03-27 DIAGNOSIS — M25662 Stiffness of left knee, not elsewhere classified: Secondary | ICD-10-CM

## 2021-03-27 DIAGNOSIS — M25562 Pain in left knee: Secondary | ICD-10-CM | POA: Diagnosis not present

## 2021-03-27 DIAGNOSIS — G8929 Other chronic pain: Secondary | ICD-10-CM

## 2021-03-27 DIAGNOSIS — M25661 Stiffness of right knee, not elsewhere classified: Secondary | ICD-10-CM
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
## 2021-03-29 ENCOUNTER — Telehealth: Payer: Self-pay

## 2021-03-29 ENCOUNTER — Ambulatory Visit: Payer: Medicare Other | Attending: Family Medicine

## 2021-03-29 DIAGNOSIS — R262 Difficulty in walking, not elsewhere classified: Secondary | ICD-10-CM | POA: Insufficient documentation

## 2021-03-29 DIAGNOSIS — M25661 Stiffness of right knee, not elsewhere classified: Secondary | ICD-10-CM | POA: Insufficient documentation

## 2021-03-29 DIAGNOSIS — M25561 Pain in right knee: Secondary | ICD-10-CM | POA: Insufficient documentation

## 2021-03-29 DIAGNOSIS — M25562 Pain in left knee: Secondary | ICD-10-CM | POA: Insufficient documentation

## 2021-03-29 DIAGNOSIS — G8929 Other chronic pain: Secondary | ICD-10-CM | POA: Insufficient documentation

## 2021-03-29 DIAGNOSIS — M25662 Stiffness of left knee, not elsewhere classified: Secondary | ICD-10-CM | POA: Insufficient documentation

## 2021-03-29 DIAGNOSIS — M6281 Muscle weakness (generalized): Secondary | ICD-10-CM | POA: Insufficient documentation

## 2021-03-29 NOTE — Telephone Encounter (Signed)
Spoke with pt. re: no show appt today. Pt stated she thought the appt was for tomorrow. Pt was reminded of the attendance policy and she voiced understanding.

## 2021-04-03 ENCOUNTER — Other Ambulatory Visit: Payer: Self-pay

## 2021-04-03 ENCOUNTER — Ambulatory Visit: Payer: Medicare Other

## 2021-04-03 ENCOUNTER — Encounter: Payer: Self-pay | Admitting: Family

## 2021-04-03 ENCOUNTER — Ambulatory Visit (INDEPENDENT_AMBULATORY_CARE_PROVIDER_SITE_OTHER): Payer: Medicare Other | Admitting: Family

## 2021-04-03 VITALS — BP 144/92 | HR 92 | Temp 98.0°F | Ht 66.0 in | Wt 256.0 lb

## 2021-04-03 DIAGNOSIS — E559 Vitamin D deficiency, unspecified: Secondary | ICD-10-CM | POA: Diagnosis not present

## 2021-04-03 DIAGNOSIS — G40909 Epilepsy, unspecified, not intractable, without status epilepticus: Secondary | ICD-10-CM | POA: Diagnosis not present

## 2021-04-03 DIAGNOSIS — R030 Elevated blood-pressure reading, without diagnosis of hypertension: Secondary | ICD-10-CM | POA: Insufficient documentation

## 2021-04-03 MED ORDER — VITAMIN D3 50 MCG (2000 UT) PO CAPS: 2000.0000 [IU] | ORAL_CAPSULE | Freq: Every day | ORAL | 5 refills | Status: DC
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
## 2021-04-03 NOTE — Assessment & Plan Note (Addendum)
Pt on disability, started about 5 years ago, idiopathic, stable on Lamictal & Zonegran, lives with GF, no kids, driving. Needs referral to someone local, has been followed by [facility].

## 2021-04-03 NOTE — Patient Instructions (Signed)
Welcome to [facility]! It was a pleasure meeting you today.  As discussed, I have sent a referral to Neurology.   Finish your prescription Vitamin D as discussed, and then start an over the counter Vitamin D3 up to 2,000units daily. I will send this as a prescription, but insurance may not cover it. Please schedule a 1 or 2  month follow up visit today for a physical with fasting labs.   PLEASE NOTE:  If you had any LAB tests please let us know if you have not heard back within a few days. You may see your results on MyChart before we have a chance to review them but we will give you a call once they are reviewed by [place]. If we ordered any REFERRALS today, please let us know if you have not heard from their office within the next week.  Let us know through MyChart if you are needing REFILLS, or have your pharmacy send [place] the request. You can also use MyChart to communicate with me or any office staff.  Please try these tips to maintain a healthy lifestyle:  Eat most of your calories during the day when you are active. Eliminate processed foods including packaged sweets (pies, cakes, cookies), reduce intake of potatoes, white bread, white pasta, and white rice. Look for whole grain options, oat flour or almond flour.  Each meal should contain half fruits/vegetables, one quarter protein, and one quarter carbs (no bigger than a computer mouse).  Cut down on sweet beverages. This includes juice, soda, and sweet tea. Also watch fruit intake, though this is a healthier sweet option, it still contains natural sugar! Limit to 3 servings daily.  Drink at least 1 glass of water with each meal and aim for at least 8 glasses per day  Exercise at least 150 minutes every week.

## 2021-04-03 NOTE — Assessment & Plan Note (Signed)
Past BP readings in wnl, will continue to monitor, advised on low sodium diet and drinking at least 64oz water qd.

## 2021-04-03 NOTE — Assessment & Plan Note (Addendum)
Still taking weekly RX, reports Neuro stating she may need to stay on long term d/t Zonegran depleting her Vitamin D, however, this is no a known concern with these 2 meds. Advised to finish current RX bottle, then start 2,000unit OTC and will recheck level in 2 mos.

## 2021-04-05 ENCOUNTER — Ambulatory Visit: Payer: Medicare Other

## 2021-04-05 ENCOUNTER — Other Ambulatory Visit: Payer: Self-pay

## 2021-04-05 DIAGNOSIS — G8929 Other chronic pain: Secondary | ICD-10-CM | POA: Diagnosis present

## 2021-04-05 DIAGNOSIS — M25562 Pain in left knee: Secondary | ICD-10-CM

## 2021-04-05 DIAGNOSIS — M25661 Stiffness of right knee, not elsewhere classified: Secondary | ICD-10-CM

## 2021-04-05 DIAGNOSIS — M25662 Stiffness of left knee, not elsewhere classified: Secondary | ICD-10-CM | POA: Diagnosis present

## 2021-04-05 DIAGNOSIS — M25561 Pain in right knee: Secondary | ICD-10-CM | POA: Diagnosis present

## 2021-04-05 DIAGNOSIS — R262 Difficulty in walking, not elsewhere classified: Secondary | ICD-10-CM | POA: Diagnosis present

## 2021-04-05 DIAGNOSIS — M6281 Muscle weakness (generalized): Secondary | ICD-10-CM
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
## 2021-04-09 ENCOUNTER — Ambulatory Visit: Payer: Medicare Other

## 2021-04-09 ENCOUNTER — Telehealth: Payer: Self-pay
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
## 2021-04-09 NOTE — Telephone Encounter (Signed)
No answer, left voicemail. Reminded of attendance policy and upcoming appointment.

## 2021-04-10 ENCOUNTER — Ambulatory Visit (INDEPENDENT_AMBULATORY_CARE_PROVIDER_SITE_OTHER): Payer: Medicare Other | Admitting: Family Medicine

## 2021-04-10 ENCOUNTER — Other Ambulatory Visit: Payer: Self-pay

## 2021-04-10 VITALS — BP 120/74 | HR 90 | Ht 66.0 in | Wt 256.8 lb

## 2021-04-10 DIAGNOSIS — M25561 Pain in right knee: Secondary | ICD-10-CM | POA: Diagnosis not present

## 2021-04-10 DIAGNOSIS — G8929 Other chronic pain: Secondary | ICD-10-CM | POA: Diagnosis not present

## 2021-04-10 DIAGNOSIS — M25562 Pain in left knee: Secondary | ICD-10-CM

## 2021-04-10 NOTE — Patient Instructions (Addendum)
Thank you for coming in today.   Continue physical therapy and the home exercises  Let me know if you want to do an injection.  Recheck back as needed.

## 2021-04-11 ENCOUNTER — Ambulatory Visit: Payer: Medicare Other

## 2021-04-11 DIAGNOSIS — R262 Difficulty in walking, not elsewhere classified: Secondary | ICD-10-CM

## 2021-04-11 DIAGNOSIS — M25562 Pain in left knee: Secondary | ICD-10-CM

## 2021-04-11 DIAGNOSIS — M6281 Muscle weakness (generalized): Secondary | ICD-10-CM

## 2021-04-11 DIAGNOSIS — M25662 Stiffness of left knee, not elsewhere classified: Secondary | ICD-10-CM

## 2021-04-11 DIAGNOSIS — G8929 Other chronic pain: Secondary | ICD-10-CM

## 2021-04-11 DIAGNOSIS — M25661 Stiffness of right knee, not elsewhere classified: Secondary | ICD-10-CM
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
## 2021-04-17 ENCOUNTER — Ambulatory Visit: Payer: Medicare Other

## 2021-04-17 ENCOUNTER — Other Ambulatory Visit: Payer: Self-pay

## 2021-04-17 DIAGNOSIS — M25562 Pain in left knee: Secondary | ICD-10-CM | POA: Diagnosis not present

## 2021-04-17 DIAGNOSIS — R262 Difficulty in walking, not elsewhere classified: Secondary | ICD-10-CM

## 2021-04-17 DIAGNOSIS — G8929 Other chronic pain: Secondary | ICD-10-CM

## 2021-04-17 DIAGNOSIS — M25662 Stiffness of left knee, not elsewhere classified: Secondary | ICD-10-CM

## 2021-04-17 DIAGNOSIS — M6281 Muscle weakness (generalized): Secondary | ICD-10-CM
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
## 2021-04-19 ENCOUNTER — Ambulatory Visit: Payer: Medicare Other

## 2021-04-26 ENCOUNTER — Ambulatory Visit: Payer: Medicare Other

## 2021-04-26 ENCOUNTER — Other Ambulatory Visit: Payer: Self-pay

## 2021-04-26 DIAGNOSIS — M25661 Stiffness of right knee, not elsewhere classified: Secondary | ICD-10-CM

## 2021-04-26 DIAGNOSIS — M25562 Pain in left knee: Secondary | ICD-10-CM | POA: Diagnosis not present

## 2021-04-26 DIAGNOSIS — M25662 Stiffness of left knee, not elsewhere classified: Secondary | ICD-10-CM

## 2021-04-26 DIAGNOSIS — M6281 Muscle weakness (generalized): Secondary | ICD-10-CM

## 2021-04-26 DIAGNOSIS — R262 Difficulty in walking, not elsewhere classified: Secondary | ICD-10-CM

## 2021-04-26 DIAGNOSIS — M25561 Pain in right knee: Secondary | ICD-10-CM

## 2021-04-27 NOTE — Patient Instructions (Signed)
Access Code: [email or web address] URL: [email or web address] Date: 04/27/2021 Prepared by: Joellyn Rued  Exercises Active Straight Leg Raise with Quad Set - 1 x daily - 7 x weekly - 2 sets - 10 reps - 3 hold Supine Bridge - 1 x daily - 7 x weekly - 2 sets - 10 reps - 3 hold Seated Long Arc Quad - 1 x daily - 7 x weekly - 2 sets - 10 reps - 3 hold Hooklying Isometric Clamshell - 1 x daily - 7 x weekly - 2 sets - 10 reps - 3 hold Gastroc Stretch on Wall - 1 x daily - 7 x weekly - 1 sets - 2 reps - 30 hold Hooklying Hamstring Stretch with Strap - 1 x daily - 7 x weekly - 1 sets - 2 reps - 30 hold Standing Quad Stretch with Strap - 1 x daily - 7 x weekly - 1 sets - 2 reps - 30 hold Supine Piriformis Stretch with Foot on Ground - 1 x daily - 7 x weekly - 1 sets - 2 reps - 30 hold

## 2021-04-27 NOTE — Therapy (Signed)
[place], [place], [address] Phone: [phone]   Fax:  [phone]  Physical Therapy Treatment/Discharge  Patient Details  Name: Kristina Coleman MRN: [id number] Date of Birth: [birth year] Referring Provider (PT): Gregor Hams, MD   Encounter Date: 04/26/2021   PT End of Session - 04/26/21 1503     Visit Number 7    Number of Visits 13    Date for PT Re-Evaluation 04/28/21    Authorization Type MED PAY ASSURANCE; MEDICARE PART A AND B    Progress Note Due on Visit 10    PT Start Time 1500    PT Stop Time 1545    PT Time Calculation (min) 45 min    Activity Tolerance Patient tolerated treatment well    Behavior During Therapy [facility] for tasks assessed/performed             Past Medical History:  Diagnosis Date   Anxiety    History of chicken pox    Intracranial hypertension    Morbid obesity ([place])    Seizures ([place]) 11/2015   Tobacco use 12/24/2016    Past Surgical History:  Procedure Laterality Date   BREAST BIOPSY     galblader removal     left knee surgery     LUMBAR PUNCTURE     fluid removal in brain.    wisdom teeth removal      There were no vitals filed for this visit.   Subjective Assessment - 04/26/21 1517     Subjective Pt reports both kness are definitely better with pt estimating 25% improvement. Pt notes hte frequency of pain at a high level has decreased. Pt states she is pleased her progress and wants to continue on her own with her HEP.    Pertinent History obesity and anxiety    Diagnostic tests X-rays per Dr. Clovis Riley report. 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  No acute fractures.     Left knee: Severe patellofemoral DJD.  Moderate medial compartment DJD mild lateral compartment DJD.  Old avulsion fragment present at medial compartment    Patient Stated Goals To hurt less and less often. To walk 4 blocks. To be able to squat.     Currently in Pain? Yes    Pain Score 3    0-7   Pain Location Knee    Pain Orientation Left    Pain Descriptors / Indicators Aching;Throbbing    Pain Type Chronic pain    Pain Onset More than a month ago    Pain Frequency Intermittent    Aggravating Factors  walking    Pain Relieving Factors rest, celebrex    Pain Score 2   0-6/10 pain range   Pain Location Knee    Pain Orientation Right    Pain Descriptors / Indicators Aching    Pain Type Chronic pain    Pain Onset More than a month ago    Pain Frequency Intermittent    Aggravating Factors  walking    Pain Relieving Factors rest, celebrex                [facility] PT Assessment - 04/26/21 0001       AROM   Right Knee Extension -3    Left Knee Extension -5      Strength   Right Hip Flexion 4+/5    Right Hip Extension 4+/5    Right Hip External Rotation  5/5  Right Hip Internal Rotation 5/5    Right Hip ABduction 5/5    Right Hip ADduction 5/5    Left Hip Flexion 4+/5    Left Hip Extension 4+/5    Left Hip External Rotation 5/5    Left Hip Internal Rotation 5/5    Left Hip ABduction 5/5    Left Hip ADduction 5/5    Right Knee Flexion 5/5    Right Knee Extension 5/5    Left Knee Flexion 5/5    Left Knee Extension 5/5      Transfers   Five time sit to stand comments  11.1 sec s hands      Ambulation/Gait   Gait Comments 2WMT=490 ft                 [facility] Adult PT Treatment/Exercise:   Therapeutic Exercise: - Gastroc stretch, 2x30", L and R  - standing quad stretch, 2x, 30", L and R - hamstring stretch, x1, 30", L and R - Bridging, 2x10, L and R - Seated, LAQ, 2x10, 4#, L and R - Supine SLR, 2x10, 4#, L and R - Supine Hip clams, 2x10, BTB - Sit to stand  2x10 from mat table   - Step up/down 4" 2x10 L and R                        PT Education - 04/27/21 0516     Education Details Final HEP. Recommendation to obtain ankle weights for resistive es at home.    Person(s)  Educated Patient    Methods Explanation;Demonstration;Tactile cues;Verbal cues;Handout    Comprehension Verbalized understanding;Returned demonstration;Verbal cues required;Tactile cues required              PT Short Term Goals - 04/17/21 1222       PT SHORT TERM GOAL #1   Title Pt wll be Ind in an inital HEP    Status Achieved    Target Date 04/17/21      PT SHORT TERM GOAL #2   Title Pt will voice understanding of measure to assist in the mangement of the knee pain. 04/17/21: rest, elevation, cold packs, celebrex    Status Achieved    Target Date 04/17/21               PT Long Term Goals - 04/27/21 0517       PT LONG TERM GOAL #1   Title Pt will be Ind in a final HEP to maintain achieved LOF    Status Achieved    Target Date 04/26/21      PT LONG TERM GOAL #2   Title Increase R knee ext ROM to 0d and L to -3d for improved quality of gait and ambulation tolerance. 04/26/21: R -3, L -5, Improved from eval    Baseline R -4, L-10    Status Partially Met    Target Date 04/26/21      PT LONG TERM GOAL #3   Title Increase bil knee and hip strength to 5/5 and 4+/5 respectively for improved quaiality of gait and ambulation tolerance. 04/26/21: B knee strength 5/5, B hip strength 4+-5/5    Status Achieved    Target Date 04/26/21      PT LONG TERM GOAL #4   Title Pt will report a decrease in bil knee pain to 4/10 or less with daily activities and for standing and walking for at least 30 mins. L 0-7/10 pain range, R  0-6 pain range. Pt reports decreased frequency of bilat knee pain in the high pain range.    Baseline 0-9/10 pain range including walking for 30+ mins    Status Partially Met    Target Date 04/26/21      PT LONG TERM GOAL #5   Title Pt 5xSTS time will derease to 15 sec or less as indication of improved strength and decreased pain.04/26/21: 11.1 sec s use of hands    Baseline 23.7 sec s use of hans    Status Achieved    Target Date 04/26/21      PT LONG  TERM GOAL #6   Title Improve pt's 2MWT to to 490 ft as indication of improved pain and strength of her knees. 04/26/21; 490 ft    Baseline 430 ft    Status Achieved    Target Date 04/26/21                   Plan - 04/26/21 1514     Clinical Impression Statement Pt presents for her last PT session today. Pt is pleased with her progress and wants to continue with her rehab at home with her provided HEP. Pt is DCed with her LTGs being partially met. Her progress with pain reduction has been fair while her physical and functional gain have been good. Pt is Ind in a HEP to maintain or progress her achieved level of function.    Personal Factors and Comorbidities Comorbidity 2;Past/Current Experience;Fitness    Comorbidities obesity, anxiety    Examination-Activity Limitations Locomotion Level;Stand;Squat;Lift    Stability/Clinical Decision Making Stable/Uncomplicated    Clinical Decision Making Low    Rehab Potential Fair    PT Frequency 2x / week    PT Duration 6 weeks    PT Treatment/Interventions ADLs/Self Care Home Management;Aquatic Therapy;Cryotherapy;Electrical Stimulation;Iontophoresis 69m/ml Dexamethasone;Moist Heat;Ultrasound;Neuromuscular re-education;Balance training;Therapeutic exercise;Therapeutic activities;Functional mobility training;Stair training;Gait training;Patient/family education;Orthotic Fit/Training;Manual techniques;Dry needling;Passive range of motion;Taping;Vasopneumatic Device;Joint Manipulations    PT Home Exercise Plan [email or web address]    Recommended Other Services Pt may benefit from orthotics for her shoes    Consulted and Agree with Plan of Care Patient             Patient will benefit from skilled therapeutic intervention in order to improve the following deficits and impairments:  Difficulty walking, Obesity, Decreased range of motion, Decreased activity tolerance, Pain, Decreased strength, Postural dysfunction, Increased edema  Visit Diagnosis: Pain  in lateral portion of left knee  Chronic pain of right knee  Muscle weakness (generalized)  Difficulty in walking, not elsewhere classified  Decreased ROM of left knee  Decreased range of motion (ROM) of right knee     Problem List Patient Active Problem List   Diagnosis Date Noted   Elevated blood pressure reading 04/03/2021   Vitamin D deficiency 04/03/2021   Chronic pain of both knees 02/27/2021   Primary osteoarthritis of both knees 02/27/2021   [place]izure ([place]    Recurrent seizures ([place] 05/02/2018   Adjustment disorder with mixed anxiety and depressed mood 07/09/2017   Tobacco use 12/24/2016   Morbid obesity ([place]    Idiopathic intracranial hypertension 12/27/2015   Partial idiopathic epilepsy with seizures of localized onset, not intractable, with status epilepticus ([place] 11/28/2015   PHYSICAL THERAPY DISCHARGE SUMMARY  Visits from Start of Care: 7  Current functional level related to goals / functional outcomes: See above   Remaining deficits: See above    Education / Equipment: HEP   Patient agrees to  discharge. Patient goals were partially met. Patient is being discharged due to being pleased with the current functional level.  Gar Ponto MS, PT 04/27/21 5:38 AM    [facility] [address] [place], [place], [address] Phone: [phone]   Fax:  [phone]  Name: Kristina Coleman MRN: [id number] Date of Birth: [birth year]

## 2021-06-04 ENCOUNTER — Encounter: Payer: Self-pay | Admitting: Family

## 2021-06-04 ENCOUNTER — Ambulatory Visit (INDEPENDENT_AMBULATORY_CARE_PROVIDER_SITE_OTHER): Payer: Medicare Other | Admitting: Family

## 2021-06-04 ENCOUNTER — Other Ambulatory Visit: Payer: Self-pay

## 2021-06-04 VITALS — BP 133/88 | Temp 98.2°F | Ht 66.0 in | Wt 255.2 lb

## 2021-06-04 DIAGNOSIS — Z79899 Other long term (current) drug therapy: Secondary | ICD-10-CM | POA: Insufficient documentation

## 2021-06-04 DIAGNOSIS — Z13220 Encounter for screening for lipoid disorders: Secondary | ICD-10-CM

## 2021-06-04 DIAGNOSIS — E559 Vitamin D deficiency, unspecified: Secondary | ICD-10-CM

## 2021-06-04 LAB — COMPREHENSIVE METABOLIC PANEL
ALT: 20 U/L (ref 0–35)
AST: 14 U/L (ref 0–37)
Albumin: 4.4 g/dL (ref 3.5–5.2)
Alkaline Phosphatase: 64 U/L (ref 39–117)
BUN: 10 mg/dL (ref 6–23)
CO2: 25 mEq/L (ref 19–32)
Calcium: 9.1 mg/dL (ref 8.4–10.5)
Chloride: 107 mEq/L (ref 96–112)
Creatinine, Ser: 0.88 mg/dL (ref 0.40–1.20)
GFR: 80.01 mL/min (ref >60.00)
Glucose, Bld: 97 mg/dL (ref 70–99)
Potassium: 3.9 mEq/L (ref 3.5–5.1)
Sodium: 138 mEq/L (ref 135–145)
Total Bilirubin: 0.5 mg/dL (ref 0.2–1.2)
Total Protein: 6.9 g/dL (ref 6.0–8.3)

## 2021-06-04 LAB — LIPID PANEL
Cholesterol: 185 mg/dL (ref 0–200)
HDL: 53.1 mg/dL (ref >39.00)
LDL Cholesterol: 119 mg/dL — ABNORMAL HIGH (ref 0–99)
NonHDL: 132.25
Total CHOL/HDL Ratio: 3
Triglycerides: 64 mg/dL (ref 0.0–149.0)
VLDL: 12.8 mg/dL (ref 0.0–40.0)

## 2021-06-04 LAB — VITAMIN D 25 HYDROXY (VIT D DEFICIENCY, FRACTURES): VITD: 32.12 ng/mL (ref 30.00–100.00)

## 2021-06-04 NOTE — Assessment & Plan Note (Signed)
rechecking level today  

## 2021-06-04 NOTE — Assessment & Plan Note (Signed)
checking liver & kidney fx due to antiseizure meds

## 2021-06-04 NOTE — Progress Notes (Signed)
Subjective:     Patient ID: Kristina Coleman, female    DOB: [birth year], 46 y.o.   MRN: [id number]  Chief Complaint  Patient presents with   Vitamin D Deficiency   Seizures    HPI: Idiopathic seizures: pt reports a sudden onset about 5 years ago. Followed by [facility]. Reports trying many different meds, now stable on Lamictal and Zonegran. Denies any recent seizure activity. Pt states she is not working, on disability. Vitamin D deficiency: pt reports taking prescription Vitamin D for years since starting her anti-seizure meds due to possible decreased absorption of calcium and risk of bone density per her neurologist. pt finished her weekly pill and has been taking an OTC daily supplement, here today to check labs.  Health Maintenance Due  Topic Date Due   Hepatitis C Screening  Never done   TETANUS/TDAP  Never done    Past Medical History:  Diagnosis Date   Anxiety    History of chicken pox    Idiopathic intracranial hypertension 12/27/2015   Lumbar Puncture: opening pressure 26cm of water. 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 Sherlean Foot ([facility])   Intracranial hypertension    Morbid obesity ([facility])    Seizures ([facility]) 11/2015   Tobacco use 12/24/2016    Past Surgical History:  Procedure Laterality Date   BREAST BIOPSY     galblader removal     left knee surgery     LUMBAR PUNCTURE     fluid removal in brain.    wisdom teeth removal      Outpatient Medications Prior to Visit  Medication Sig Dispense Refill   celecoxib (CELEBREX) 100 MG capsule Take 200 mg by mouth as needed.     Cholecalciferol (VITAMIN D3) 50 MCG (2000 UT) capsule Take 1 capsule (2,000 Units total) by mouth daily. Start daily dose 1 WEEK after your last weekly dose of 50,000units. (Patient taking differently: Take 10,000 Units by mouth daily. Start daily 10,000 units daily.) 30 capsule  5   diclofenac Sodium (VOLTAREN) 1 % GEL Apply 4 g topically 4 (four) times daily. To affected joint. 100 g 11   lamoTRIgine (LAMICTAL) 100 MG tablet Take 500 mg by mouth daily.     zonisamide (ZONEGRAN) 100 MG capsule Take 3 capsules (300 mg total) by mouth at bedtime. 90 capsule 0   No facility-administered medications prior to visit.    No Known Allergies      Objective:    Physical Exam Vitals and nursing note reviewed.  Constitutional:      Appearance: Normal appearance. She is obese.  Cardiovascular:     Rate and Rhythm: Normal rate and regular rhythm.  Pulmonary:     Effort: Pulmonary effort is normal.     Breath sounds: Normal breath sounds.  Musculoskeletal:        General: Normal range of motion.  Skin:    General: Skin is warm and dry.  Neurological:     Mental Status: She is alert.  Psychiatric:        Mood and Affect: Mood normal.        Behavior: Behavior normal.    BP 133/88    Temp 98.2 F (36.8 C) (Temporal)    Ht 5\' 6"  (1.676 m)    Wt 255 lb 3.2 oz (115.8 kg)    SpO2 97%    BMI 41.19 kg/m  Wt  Readings from Last 3 Encounters:  06/04/21 255 lb 3.2 oz (115.8 kg)  04/10/21 256 lb 12.8 oz (116.5 kg)  04/03/21 256 lb (116.1 kg)       Assessment & Plan:   Problem List Items Addressed This Visit       Other   Vitamin D deficiency - Primary    rechecking level today      Relevant Orders   Vitamin D (25 hydroxy)   High risk medication use    checking liver & kidney fx due to antiseizure meds      Relevant Orders   Comprehensive metabolic panel   Other Visit Diagnoses     Encounter for screening for lipid disorder       Relevant Orders   Lipid panel

## 2021-06-04 NOTE — Patient Instructions (Signed)
It was very nice to see you today!  Please go to the lab today for blood work. For your right ear was, pick up generic Debrox ear drops and place 3-4 drops in, cover with a cotton ball for 3 nights, then wash ear out with water and gently use Q-tip around the edges of your ear to pull out loose wax.     PLEASE NOTE:  If you had any lab tests please let us know if you have not heard back within a few days. You may see your results on MyChart before we have a chance to review them but we will give you a call once they are reviewed by [place]. If we ordered any referrals today, please let us know if you have not heard from their office within the next week.   Please try these tips to maintain a healthy lifestyle:  Eat most of your calories during the day when you are active. Eliminate processed foods including packaged sweets (pies, cakes, cookies), reduce intake of potatoes, white bread, white pasta, and white rice. Look for whole grain options, oat flour or almond flour.  Each meal should contain half fruits/vegetables, one quarter protein, and one quarter carbs (no bigger than a computer mouse).  Cut down on sweet beverages. This includes juice, soda, and sweet tea. Also watch fruit intake, though this is a healthier sweet option, it still contains natural sugar! Limit to 3 servings daily.  Drink at least 1 glass of water with each meal and aim for at least 8 glasses per day  Exercise at least 150 minutes every week.

## 2021-06-12 ENCOUNTER — Telehealth: Payer: Self-pay

## 2021-06-12 NOTE — Telephone Encounter (Signed)
Patient has called back for lab results.  I have read her Kristina Coleman response in regard.  Patient understood.

## 2021-06-13 NOTE — Telephone Encounter (Signed)
See lab results.  

## 2022-01-11 ENCOUNTER — Ambulatory Visit (INDEPENDENT_AMBULATORY_CARE_PROVIDER_SITE_OTHER): Payer: Medicare Other

## 2022-01-11 DIAGNOSIS — Z0000 Encounter for general adult medical examination without abnormal findings: Secondary | ICD-10-CM

## 2022-01-11 NOTE — Patient Instructions (Addendum)
Kristina Coleman , Thank you for taking time to come for your Medicare Wellness Visit. I appreciate your ongoing commitment to your health goals. Please review the following plan we discussed and let me know if I can assist you in the future.   Screening recommendations/referrals: Mammogram: pt will make an appt  Recommended yearly ophthalmology/optometry visit for glaucoma screening and checkup Recommended yearly dental visit for hygiene and checkup  Vaccinations: Influenza vaccine: declined  Pneumococcal vaccine: declined  Tdap vaccine: declined   Covid-19: declined and discussed   Advanced directives: Advance directive discussed with you today. Even though you declined this today please call our office should you change your mind and we can give you the proper paperwork for you to fill out.  Conditions/risks identified: lose weight   Next appointment: Follow up in one year for your annual wellness visit.   Preventive Care 40-64 Years, Female Preventive care refers to lifestyle choices and visits with your health care provider that can promote health and wellness. What does preventive care include? A yearly physical exam. This is also called an annual well check. Dental exams once or twice a year. Routine eye exams. Ask your health care provider how often you should have your eyes checked. Personal lifestyle choices, including: Daily care of your teeth and gums. Regular physical activity. Eating a healthy diet. Avoiding tobacco and drug use. Limiting alcohol use. Practicing safe sex. Taking low-dose aspirin daily starting at age 19. Taking vitamin and mineral supplements as recommended by your health care provider. What happens during an annual well check? The services and screenings done by your health care provider during your annual well check will depend on your age, overall health, lifestyle risk factors, and family history of disease. Counseling  Your health care provider may  ask you questions about your: Alcohol use. Tobacco use. Drug use. Emotional well-being. Home and relationship well-being. Sexual activity. Eating habits. Work and work Statistician. Method of birth control. Menstrual cycle. Pregnancy history. Screening  You may have the following tests or measurements: Height, weight, and BMI. Blood pressure. Lipid and cholesterol levels. These may be checked every 5 years, or more frequently if you are over 79 years old. Skin check. Lung cancer screening. You may have this screening every year starting at age 110 if you have a 30-pack-year history of smoking and currently smoke or have quit within the past 15 years. Fecal occult blood test (FOBT) of the stool. You may have this test every year starting at age 51. Flexible sigmoidoscopy or colonoscopy. You may have a sigmoidoscopy every 5 years or a colonoscopy every 10 years starting at age 65. Hepatitis C blood test. Hepatitis B blood test. Sexually transmitted disease (STD) testing. Diabetes screening. This is done by checking your blood sugar (glucose) after you have not eaten for a while (fasting). You may have this done every 1-3 years. Mammogram. This may be done every 1-2 years. Talk to your health care provider about when you should start having regular mammograms. This may depend on whether you have a family history of breast cancer. BRCA-related cancer screening. This may be done if you have a family history of breast, ovarian, tubal, or peritoneal cancers. Pelvic exam and Pap test. This may be done every 3 years starting at age 57. Starting at age 6, this may be done every 5 years if you have a Pap test in combination with an HPV test. Bone density scan. This is done to screen for osteoporosis. You may  have this scan if you are at high risk for osteoporosis. Discuss your test results, treatment options, and if necessary, the need for more tests with your health care provider. Vaccines  Your  health care provider may recommend certain vaccines, such as: Influenza vaccine. This is recommended every year. Tetanus, diphtheria, and acellular pertussis (Tdap, Td) vaccine. You may need a Td booster every 10 years. Zoster vaccine. You may need this after age 72. Pneumococcal 13-valent conjugate (PCV13) vaccine. You may need this if you have certain conditions and were not previously vaccinated. Pneumococcal polysaccharide (PPSV23) vaccine. You may need one or two doses if you smoke cigarettes or if you have certain conditions. Talk to your health care provider about which screenings and vaccines you need and how often you need them. This information is not intended to replace advice given to you by your health care provider. Make sure you discuss any questions you have with your health care provider. Document Released: 05/12/2015 Document Revised: 01/03/2016 Document Reviewed: 02/14/2015 Elsevier Interactive Patient Education  2017 [place] Prevention in the Home Falls can cause injuries. They can happen to people of all ages. There are many things you can do to make your home safe and to help prevent falls. What can I do on the outside of my home? Regularly fix the edges of walkways and driveways and fix any cracks. Remove anything that might make you trip as you walk through a door, such as a raised step or threshold. Trim any bushes or trees on the path to your home. Use bright outdoor lighting. Clear any walking paths of anything that might make someone trip, such as rocks or tools. Regularly check to see if handrails are loose or broken. Make sure that both sides of any steps have handrails. Any raised decks and porches should have guardrails on the edges. Have any leaves, snow, or ice cleared regularly. Use sand or salt on walking paths during winter. Clean up any spills in your garage right away. This includes oil or grease spills. What can I do in the  bathroom? Use night lights. Install grab bars by the toilet and in the tub and shower. Do not use towel bars as grab bars. Use non-skid mats or decals in the tub or shower. If you need to sit down in the shower, use a plastic, non-slip stool. Keep the floor dry. Clean up any water that spills on the floor as soon as it happens. Remove soap buildup in the tub or shower regularly. Attach bath mats securely with double-sided non-slip rug tape. Do not have throw rugs and other things on the floor that can make you trip. What can I do in the bedroom? Use night lights. Make sure that you have a light by your bed that is easy to reach. Do not use any sheets or blankets that are too big for your bed. They should not hang down onto the floor. Have a firm chair that has side arms. You can use this for support while you get dressed. Do not have throw rugs and other things on the floor that can make you trip. What can I do in the kitchen? Clean up any spills right away. Avoid walking on wet floors. Keep items that you use a lot in easy-to-reach places. If you need to reach something above you, use a strong step stool that has a grab bar. Keep electrical cords out of the way. Do not use floor polish  or wax that makes floors slippery. If you must use wax, use non-skid floor wax. Do not have throw rugs and other things on the floor that can make you trip. What can I do with my stairs? Do not leave any items on the stairs. Make sure that there are handrails on both sides of the stairs and use them. Fix handrails that are broken or loose. Make sure that handrails are as long as the stairways. Check any carpeting to make sure that it is firmly attached to the stairs. Fix any carpet that is loose or worn. Avoid having throw rugs at the top or bottom of the stairs. If you do have throw rugs, attach them to the floor with carpet tape. Make sure that you have a light switch at the top of the stairs and the  bottom of the stairs. If you do not have them, ask someone to add them for you. What else can I do to help prevent falls? Wear shoes that: Do not have high heels. Have rubber bottoms. Are comfortable and fit you well. Are closed at the toe. Do not wear sandals. If you use a stepladder: Make sure that it is fully opened. Do not climb a closed stepladder. Make sure that both sides of the stepladder are locked into place. Ask someone to hold it for you, if possible. Clearly mark and make sure that you can see: Any grab bars or handrails. First and last steps. Where the edge of each step is. Use tools that help you move around (mobility aids) if they are needed. These include: Canes. Walkers. Scooters. Crutches. Turn on the lights when you go into a dark area. Replace any light bulbs as soon as they burn out. Set up your furniture so you have a clear path. Avoid moving your furniture around. If any of your floors are uneven, fix them. If there are any pets around you, be aware of where they are. Review your medicines with your doctor. Some medicines can make you feel dizzy. This can increase your chance of falling. Ask your doctor what other things that you can do to help prevent falls. This information is not intended to replace advice given to you by your health care provider. Make sure you discuss any questions you have with your health care provider. Document Released: 02/09/2009 Document Revised: 09/21/2015 Document Reviewed: 05/20/2014 Elsevier Interactive Patient Education  2017 [employer].

## 2022-01-11 NOTE — Progress Notes (Signed)
Virtual Visit via Telephone Note  I connected with  Kristina Coleman on 01/11/22 at  2:45 PM EDT by telephone and verified that I am speaking with the correct person using two identifiers.  Medicare Annual Wellness visit completed telephonically due to Covid-19 pandemic.   Persons participating in this call: This Health Coach and this patient.   Location: Patient: home Provider: office    I discussed the limitations, risks, security and privacy concerns of performing an evaluation and management service by telephone and the availability of in person appointments. The patient expressed understanding and agreed to proceed.  Unable to perform video visit due to video visit attempted and failed and/or patient does not have video capability.   Some vital signs may be absent or patient reported.   [name], LPN   Subjective:   Kristina Coleman is a 46 y.o. female who presents for an Initial Medicare Annual Wellness Visit.  Review of Systems     Cardiac Risk Factors include: smoking/ tobacco exposure;obesity (BMI >30kg/m2)     Objective:    There were no vitals filed for this visit. There is no height or weight on file to calculate BMI.     01/11/2022    2:43 PM 02/20/2021   12:48 PM 05/03/2018   12:00 AM 05/02/2018    6:39 PM 04/10/2017    4:49 PM 07/25/2016    8:18 AM 12/19/2015    5:01 AM  Advanced Directives  Does Patient Have a Medical Advance Directive? No No No No No No No  Would patient like information on creating a medical advance directive? No - Patient declined  No - Patient declined No - Patient declined       Current Medications (verified) Outpatient Encounter Medications as of 01/11/2022  Medication Sig   Cholecalciferol (VITAMIN D3) 50 MCG (2000 UT) capsule Take 1 capsule (2,000 Units total) by mouth daily. Start daily dose 1 WEEK after your last weekly dose of 50,000units. (Patient taking differently: Take 10,000 Units by mouth daily. Start daily 10,000 units  daily.)   diclofenac Sodium (VOLTAREN) 1 % GEL Apply 4 g topically 4 (four) times daily. To affected joint.   lamoTRIgine (LAMICTAL) 100 MG tablet Take 500 mg by mouth daily.   zonisamide (ZONEGRAN) 100 MG capsule Take 3 capsules (300 mg total) by mouth at bedtime.   [DISCONTINUED] celecoxib (CELEBREX) 100 MG capsule Take 200 mg by mouth as needed.   No facility-administered encounter medications on file as of 01/11/2022.    Allergies (verified) Patient has no known allergies.   History: Past Medical History:  Diagnosis Date   Anxiety    History of chicken pox    Idiopathic intracranial hypertension 12/27/2015   Lumbar Puncture: opening pressure 26cm of water. 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 Sherlean Foot ([facility])   Intracranial hypertension    Morbid obesity (HCC)    Seizures (HCC) 11/2015   Tobacco use 12/24/2016   Past Surgical History:  Procedure Laterality Date   BREAST BIOPSY     galblader removal     left knee surgery     LUMBAR PUNCTURE     fluid removal in brain.    wisdom teeth removal     Family History  Problem Relation Age of Onset   Diabetes Mother    Hypertension Mother    Arthritis Mother    Diabetes Father  Hypertension Father    Glaucoma Father    Cataracts Father    Cancer Sister        breast cancer   Breast cancer Sister 26   Social History   Socioeconomic History   Marital status: Significant Other    Spouse name: Not on file   Number of children: 0   Years of education: college   Highest education level: Not on file  Occupational History   Occupation: unemployed  Tobacco Use   Smoking status: Light Smoker    Packs/day: 0.25    Types: Cigarettes   Smokeless tobacco: Never  Vaping Use   Vaping Use: Never used  Substance and Sexual Activity   Alcohol use: Not Currently    Comment: on occasion   Drug use: Yes     Types: Marijuana    Comment: every day   Sexual activity: Yes    Birth control/protection: None  Other Topics Concern   Not on file  Social History Narrative   Occasionally drinks tea    Social Determinants of Health   Financial Resource Strain: Low Risk  (01/11/2022)   Overall Financial Resource Strain (CARDIA)    Difficulty of Paying Living Expenses: Not hard at all  Food Insecurity: No Food Insecurity (01/11/2022)   Hunger Vital Sign    Worried About Running Out of Food in the Last Year: Never true    Ran Out of Food in the Last Year: Never true  Transportation Needs: No Transportation Needs (01/11/2022)   PRAPARE - Administrator, Civil Service (Medical): No    Lack of Transportation (Non-Medical): No  Physical Activity: Inactive (01/11/2022)   Exercise Vital Sign    Days of Exercise per Week: 0 days    Minutes of Exercise per Session: 0 min  Stress: Stress Concern Present (01/11/2022)   [employer] of Occupational Health - Occupational Stress Questionnaire    Feeling of Stress : To some extent  Social Connections: Moderately Isolated (01/11/2022)   Social Connection and Isolation Panel [NHANES]    Frequency of Communication with Friends and Family: More than three times a week    Frequency of Social Gatherings with Friends and Family: More than three times a week    Attends Religious Services: Never    Database administrator or Organizations: No    Attends Engineer, structural: Never    Marital Status: Living with partner    Tobacco Counseling Ready to quit: Not Answered Counseling given: Not Answered   Clinical Intake:  Pre-visit preparation completed: Yes  Pain : No/denies pain     BMI - recorded: 41.21 Nutritional Status: BMI > 30  Obese Nutritional Risks: None Diabetes: No  How often do you need to have someone help you when you read instructions, pamphlets, or other written materials from your doctor or pharmacy?: 1 -  Never  Diabetic?no  Interpreter Needed?: No  Information entered by :: Lanier Ensign, LPN   Activities of Daily Living    01/11/2022    2:45 PM  In your present state of health, do you have any difficulty performing the following activities:  Hearing? 0  Vision? 0  Difficulty concentrating or making decisions? 0  Walking or climbing stairs? 0  Dressing or bathing? 0  Doing errands, shopping? 0  Preparing Food and eating ? N  Using the Toilet? N  In the past six months, have you accidently leaked urine? N  Do you have problems with loss  of bowel control? N  Managing your Medications? N  Managing your Finances? N  Housekeeping or managing your Housekeeping? N    Patient Care Team: Jeanie Sewer, NP as PCP - General (Family Medicine)  Indicate any recent Medical Services you may have received from other than [facility] providers in the past year (date may be approximate).     Assessment:   This is a routine wellness examination for Kristina Coleman.  Hearing/Vision screen Hearing Screening - Comments:: Pt denies any hearing issues  Vision Screening - Comments:: Pt will follow up with a new provider   Dietary issues and exercise activities discussed: Current Exercise Habits: The patient does not participate in regular exercise at present   Goals Addressed             This Visit's Progress    Patient Stated       Lose weight        Depression Screen    01/11/2022    2:41 PM 06/04/2021   11:18 AM 04/03/2021    9:58 AM 12/15/2017   11:01 AM 06/25/2017    3:58 PM 06/25/2017    2:47 PM 10/18/2016    2:39 PM  PHQ 2/9 Scores  PHQ - 2 Score 0 0 0 5 3 2 1   PHQ- 9 Score    15 16  3     Fall Risk    01/11/2022    2:44 PM 06/04/2021   11:18 AM 04/03/2021    9:58 AM 06/25/2017    3:57 PM 06/25/2017    2:47 PM  Fall Risk   Falls in the past year? 0 0 0 No No  Number falls in past yr: 0      Injury with Fall? 0      Risk for fall due to : No Fall Risks;Impaired balance/gait No  Fall Risks No Fall Risks    Risk for fall due to: Comment at times related to cyle times      Follow up Falls prevention discussed        [place]:  Any stairs in or around the home? Yes  If so, are there any without handrails? No  Home free of loose throw rugs in walkways, pet beds, electrical cords, etc? Yes  Adequate lighting in your home to reduce risk of falls? Yes   ASSISTIVE DEVICES UTILIZED TO PREVENT FALLS:  Life alert? No  Use of a cane, walker or w/c? No  Grab bars in the bathroom? No  Shower chair or bench in shower? No  Elevated toilet seat or a handicapped toilet? No   TIMED UP AND GO:  Was the test performed? No .   Cognitive Function:        01/11/2022    2:47 PM  6CIT Screen  What Year? 0 points  What month? 0 points  What time? 0 points  Count back from 20 0 points  Months in reverse 0 points  Repeat phrase 4 points  Total Score 4 points    Immunizations  There is no immunization history on file for this patient.  TDAP status: Due, Education has been provided regarding the importance of this vaccine. Advised may receive this vaccine at local pharmacy or Health Dept. Aware to provide a copy of the vaccination record if obtained from local pharmacy or Health Dept. Verbalized acceptance and understanding.  Flu Vaccine status: Declined, Education has been provided regarding the importance of this vaccine but patient still  declined. Advised may receive this vaccine at local pharmacy or Health Dept. Aware to provide a copy of the vaccination record if obtained from local pharmacy or Health Dept. Verbalized acceptance and understanding.  Pneumococcal vaccine status: Declined,  Education has been provided regarding the importance of this vaccine but patient still declined. Advised may receive this vaccine at local pharmacy or Health Dept. Aware to provide a copy of the vaccination record if obtained from local pharmacy or  Health Dept. Verbalized acceptance and understanding.   Covid-19 vaccine status: Declined, Education has been provided regarding the importance of this vaccine but patient still declined. Advised may receive this vaccine at local pharmacy or Health Dept.or vaccine clinic. Aware to provide a copy of the vaccination record if obtained from local pharmacy or Health Dept. Verbalized acceptance and understanding.  Qualifies for Shingles Vaccine? No    Screening Tests Health Maintenance  Topic Date Due   Hepatitis C Screening  Never done   TETANUS/TDAP  Never done   INFLUENZA VACCINE  Never done   PAP SMEAR-Modifier  06/30/2022   HIV Screening  Completed   HPV VACCINES  Aged Out   COVID-19 Vaccine  Discontinued    Health Maintenance  Health Maintenance Due  Topic Date Due   Hepatitis C Screening  Never done   TETANUS/TDAP  Never done   INFLUENZA VACCINE  Never done      Mammogram status: Completed 12/30/19. Repeat every year     Additional Screening:  Hepatitis C Screening: does qualify;  Vision Screening: Recommended annual ophthalmology exams for early detection of glaucoma and other disorders of the eye. Is the patient up to date with their annual eye exam?  Yes  Who is the provider or what is the name of the office in which the patient attends annual eye exams? Will follow up with new provider  If pt is not established with a provider, would they like to be referred to a provider to establish care? No .   Dental Screening: Recommended annual dental exams for proper oral hygiene  Community Resource Referral / Chronic Care Management: CRR required this visit?  No   CCM required this visit?  No      Plan:     I have personally reviewed and noted the following in the patient's chart:   Medical and social history Use of alcohol, tobacco or illicit drugs  Current medications and supplements including opioid prescriptions. Patient is not currently taking opioid  prescriptions. Functional ability and status Nutritional status Physical activity Advanced directives List of other physicians Hospitalizations, surgeries, and ER visits in previous 12 months Vitals Screenings to include cognitive, depression, and falls Referrals and appointments  In addition, I have reviewed and discussed with patient certain preventive protocols, quality metrics, and best practice recommendations. A written personalized care plan for preventive services as well as general preventive health recommendations were provided to patient.     Willette Brace, LPN   [id number]   Nurse Notes: none

## 2022-01-21 ENCOUNTER — Encounter: Payer: Self-pay | Admitting: *Deleted

## 2022-02-08 ENCOUNTER — Encounter: Payer: Medicare Other | Admitting: Family

## 2022-02-08 NOTE — Progress Notes (Deleted)
Phone [phone]  Subjective:   Patient is a 46 y.o. female presenting for annual physical.    No chief complaint on file.   See problem oriented charting- ROS- full  review of systems was completed and negative except for: ***noted in HPI above.  The following were reviewed and entered/updated in epic: Past Medical History:  Diagnosis Date   Anxiety    History of chicken pox    Idiopathic intracranial hypertension 12/27/2015   Lumbar Puncture: opening pressure 26cm of water. 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 Opal Sidles ([facility])   Intracranial hypertension    Morbid obesity ([place])    Seizures ([place]) 11/2015   Tobacco use 12/24/2016   Patient Active Problem List   Diagnosis Date Noted   High risk medication use 06/04/2021   Elevated blood pressure reading 04/03/2021   Vitamin D deficiency 04/03/2021   Chronic pain of both knees 02/27/2021   Primary osteoarthritis of both knees 02/27/2021   Recurrent seizures ([place]) 05/02/2018   Adjustment disorder with mixed anxiety and depressed mood 07/09/2017   Tobacco use 12/24/2016   Morbid obesity ([place])    Partial idiopathic epilepsy with seizures of localized onset, not intractable, with status epilepticus ([place]) 11/28/2015   Past Surgical History:  Procedure Laterality Date   BREAST BIOPSY     galblader removal     left knee surgery     LUMBAR PUNCTURE     fluid removal in brain.    wisdom teeth removal      Family History  Problem Relation Age of Onset   Diabetes Mother    Hypertension Mother    Arthritis Mother    Diabetes Father    Hypertension Father    Glaucoma Father    Cataracts Father    Cancer Sister        breast cancer   Breast cancer Sister 30    Medications- reviewed and updated Current Outpatient Medications  Medication Sig Dispense Refill   Cholecalciferol (VITAMIN D3) 50 MCG  (2000 UT) capsule Take 1 capsule (2,000 Units total) by mouth daily. Start daily dose 1 WEEK after your last weekly dose of 50,000units. (Patient taking differently: Take 10,000 Units by mouth daily. Start daily 10,000 units daily.) 30 capsule 5   diclofenac Sodium (VOLTAREN) 1 % GEL Apply 4 g topically 4 (four) times daily. To affected joint. 100 g 11   lamoTRIgine (LAMICTAL) 100 MG tablet Take 500 mg by mouth daily.     zonisamide (ZONEGRAN) 100 MG capsule Take 3 capsules (300 mg total) by mouth at bedtime. 90 capsule 0   No current facility-administered medications for this visit.    Allergies-reviewed and updated No Known Allergies  Social History   Social History Narrative   Occasionally drinks tea     Objective:  There were no vitals taken for this visit. Physical Exam Vitals and nursing note reviewed.  Constitutional:      Appearance: Normal appearance.  HENT:     Head: Normocephalic.     Right Ear: Tympanic membrane normal.     Left Ear: Tympanic membrane normal.     Nose: Nose normal.     Mouth/Throat:     Mouth: Mucous membranes are moist.  Eyes:     Pupils: Pupils are equal, round, and reactive to light.  Cardiovascular:     Rate and Rhythm: Normal rate and regular rhythm.  Pulmonary:     Effort: Pulmonary effort is normal.     Breath sounds: Normal breath sounds.  Musculoskeletal:        General: Normal range of motion.     Cervical back: Normal range of motion.  Lymphadenopathy:     Cervical: No cervical adenopathy.  Skin:    General: Skin is warm and dry.  Neurological:     Mental Status: She is alert.  Psychiatric:        Mood and Affect: Mood normal.        Behavior: Behavior normal.       Assessment and Plan   Health Maintenance counseling: 1. Anticipatory guidance: Patient counseled regarding regular dental exams q6 months, eye exams,  avoiding smoking and second hand smoke, limiting alcohol to 1 beverage per day, no illicit drugs.   2. Risk  factor reduction:  Advised patient of need for regular exercise and diet rich with fruits and vegetables to reduce risk of heart attack and stroke. Exercise- ***.  Wt Readings from Last 3 Encounters:  06/04/21 255 lb 3.2 oz (115.8 kg)  04/10/21 256 lb 12.8 oz (116.5 kg)  04/03/21 256 lb (116.1 kg)   3. Immunizations/screenings/ancillary studies  There is no immunization history on file for this patient. Health Maintenance Due  Topic Date Due   Hepatitis C Screening  Never done   TETANUS/TDAP  Never done   INFLUENZA VACCINE  Never done    4. Cervical cancer screening- *** 5. Breast cancer screening-  mammogram *** 6. Colon cancer screening - *** 7. Skin cancer screening- advised regular sunscreen use. Denies worrisome, changing, or new skin lesions.  8. Birth control/STD check- *** 9. Osteoporosis screening- *** 10. Alcohol screening: *** 11. Smoking associated screening (lung cancer screening, AAA screen 65-75, UA)- *** smoker- ***ppd  Problem List Items Addressed This Visit   None   Recommended follow up: ***No follow-ups on file. Future Appointments  Date Time Provider Department Center  02/08/2022  1:20 PM Dulce Sellar, NP [facility]  01/17/2023  2:00 PM [facility]    Lab/Order associations:fasting   Dulce Sellar, NP

## 2022-02-12 ENCOUNTER — Ambulatory Visit (INDEPENDENT_AMBULATORY_CARE_PROVIDER_SITE_OTHER): Payer: Medicare Other | Admitting: Family

## 2022-02-12 ENCOUNTER — Encounter: Payer: Self-pay | Admitting: Family

## 2022-02-12 VITALS — BP 130/90 | HR 90 | Temp 97.5°F | Ht 66.0 in | Wt 244.5 lb

## 2022-02-12 DIAGNOSIS — M25532 Pain in left wrist: Secondary | ICD-10-CM | POA: Diagnosis not present

## 2022-02-12 DIAGNOSIS — E559 Vitamin D deficiency, unspecified: Secondary | ICD-10-CM

## 2022-02-12 DIAGNOSIS — Z1231 Encounter for screening mammogram for malignant neoplasm of breast: Secondary | ICD-10-CM | POA: Diagnosis not present

## 2022-02-12 DIAGNOSIS — G40001 Localization-related (focal) (partial) idiopathic epilepsy and epileptic syndromes with seizures of localized onset, not intractable, with status epilepticus: Secondary | ICD-10-CM

## 2022-02-12 DIAGNOSIS — G40909 Epilepsy, unspecified, not intractable, without status epilepticus: Secondary | ICD-10-CM

## 2022-02-12 LAB — COMPREHENSIVE METABOLIC PANEL
ALT: 14 U/L (ref 0–35)
AST: 12 U/L (ref 0–37)
Albumin: 4.3 g/dL (ref 3.5–5.2)
Alkaline Phosphatase: 64 U/L (ref 39–117)
BUN: 11 mg/dL (ref 6–23)
CO2: 23 mEq/L (ref 19–32)
Calcium: 9.1 mg/dL (ref 8.4–10.5)
Chloride: 107 mEq/L (ref 96–112)
Creatinine, Ser: 0.91 mg/dL (ref 0.40–1.20)
GFR: 76.48 mL/min (ref >60.00)
Glucose, Bld: 104 mg/dL — ABNORMAL HIGH (ref 70–99)
Potassium: 4.1 mEq/L (ref 3.5–5.1)
Sodium: 138 mEq/L (ref 135–145)
Total Bilirubin: 0.5 mg/dL (ref 0.2–1.2)
Total Protein: 6.8 g/dL (ref 6.0–8.3)

## 2022-02-12 LAB — LIPID PANEL
Cholesterol: 172 mg/dL (ref 0–200)
HDL: 55.6 mg/dL (ref >39.00)
LDL Cholesterol: 104 mg/dL — ABNORMAL HIGH (ref 0–99)
NonHDL: 116.18
Total CHOL/HDL Ratio: 3
Triglycerides: 60 mg/dL (ref 0.0–149.0)
VLDL: 12 mg/dL (ref 0.0–40.0)

## 2022-02-12 LAB — CBC WITH DIFFERENTIAL/PLATELET
Basophils Absolute: 0 10*3/uL (ref 0.0–0.1)
Basophils Relative: 0.7 % (ref 0.0–3.0)
Eosinophils Absolute: 0.1 10*3/uL (ref 0.0–0.7)
Eosinophils Relative: 2.2 % (ref 0.0–5.0)
HCT: 41.9 % (ref 36.0–46.0)
Hemoglobin: 14 g/dL (ref 12.0–15.0)
Lymphocytes Relative: 32.8 % (ref 12.0–46.0)
Lymphs Abs: 1.1 10*3/uL (ref 0.7–4.0)
MCHC: 33.4 g/dL (ref 30.0–36.0)
MCV: 93.9 fl (ref 78.0–100.0)
Monocytes Absolute: 0.3 10*3/uL (ref 0.1–1.0)
Monocytes Relative: 7.6 % (ref 3.0–12.0)
Neutro Abs: 1.9 10*3/uL (ref 1.4–7.7)
Neutrophils Relative %: 56.7 % (ref 43.0–77.0)
Platelets: 243 10*3/uL (ref 150.0–400.0)
RBC: 4.46 Mil/uL (ref 3.87–5.11)
RDW: 13 % (ref 11.5–15.5)
WBC: 3.3 10*3/uL — ABNORMAL LOW (ref 4.0–10.5)

## 2022-02-12 LAB — VITAMIN D 25 HYDROXY (VIT D DEFICIENCY, FRACTURES): VITD: 22.54 ng/mL — ABNORMAL LOW (ref 30.00–100.00)

## 2022-02-12 MED ORDER — LAMOTRIGINE 100 MG PO TABS: 500.0000 mg | ORAL_TABLET | Freq: Every day | ORAL | 2 refills | Status: DC

## 2022-02-12 MED ORDER — ZONISAMIDE 100 MG PO CAPS: 200.0000 mg | ORAL_CAPSULE | Freq: Every day | ORAL | 2 refills | Status: DC

## 2022-02-12 NOTE — Patient Instructions (Signed)
It was very nice to see you today!   I will review your lab results via MyChart in a few days.  I have ordered a new referral to Neurology - Velora Heckler - look for their call on your phone, they could not reach you last time referral sent!  I also ordered a mammogram - this will come from the [facility]- they will call and schedule with you.  I have sent in your med refills.    PLEASE NOTE:  If you had any lab tests please let us know if you have not heard back within a few days. You may see your results on MyChart before we have a chance to review them but we will give you a call once they are reviewed by [place]. If we ordered any referrals today, please let us know if you have not heard from their office within the next week.

## 2022-02-12 NOTE — Assessment & Plan Note (Signed)
   previously followed by [facility]  Pt on disability, started about 5 years ago, idiopathic  stable on Lamictal 500mg  qd & Zonegran 200mg  qhs  lives with GF, no kids, driving  referral sent 03/2021 to [facility], but unable to reach pt, sending new referral, pt advised to look for call & have vm open to take messages.  sending refills today

## 2022-02-12 NOTE — Progress Notes (Signed)
Patient ID: Kristina Coleman, female    DOB: 06-16-1975, 46 y.o.   MRN: [id number]  Chief Complaint  Patient presents with   Seizures    Medication refill, labs   Wrist Pain    Left    HPI: Left wrist pain:  Pt c/o left wrist pain for about a week, reports mild swelling. Has tenderness on top of wrist and into her hand, denies any neuropathies. She has tried a brace which does help with the pain some.   Idiopathic seizures: pt reports a sudden onset about 5 years ago. Followed by [facility]. Reports trying many different meds, now stable on Lamictal and Zonegran. Denies any recent seizure activity. Pt states she is not working.  Vitamin D deficiency: pt reports taking prescription Vitamin D for years since starting her anti-seizure meds due to possible decreased absorption of calcium and risk of bone density per her neurologist.   Assessment & Plan:   Problem List Items Addressed This Visit       Nervous and Auditory   Partial idiopathic epilepsy with seizures of localized onset, not intractable, with status epilepticus (HCC)    previously followed by [facility] Pt on disability, started about 5 years ago, idiopathic stable on Lamictal 500mg  qd & Zonegran 200mg  qhs lives with GF, no kids, driving referral sent to [facility], but unable to reach pt, sending new referral, pt advised to look for call & have vm open to take messages. sending refills today      Relevant Medications   zonisamide (ZONEGRAN) 100 MG capsule   lamoTRIgine (LAMICTAL) 100 MG tablet   Other Relevant Orders  Comprehensive metabolic panel (Completed)  Lipid panel (Completed)  CBC with Differential/Platelet (Completed)  Ambulatory referral to Neurology         Other   Vitamin D deficiency   Relevant Orders   Vitamin D (25 hydroxy) (Completed)   Other Visit Diagnoses     Breast cancer screening by mammogram       Relevant Orders   MM DIGITAL SCREENING BILATERAL   Left wrist pain     - mild swelling noted on dorsal hand and wrist, advised pt on proper use of wrist/hand brace, apply ice for up to 20-57minutes tid. Avoid heavy lifting or overuse of hand until sx are better.      Subjective:    Outpatient Medications Prior to Visit  Medication Sig Dispense Refill   Cholecalciferol (VITAMIN D3) 50 MCG (2000 UT) capsule Take 1 capsule (2,000 Units total) by mouth daily. Start daily dose 1 WEEK after your last weekly dose of 50,000units. (Patient taking differently: Take 10,000 Units by mouth daily. Start daily 10,000 units daily.) 30 capsule 5   diclofenac Sodium (VOLTAREN) 1 % GEL Apply 4 g topically 4 (four) times daily. To affected joint. 100 g 11   lamoTRIgine (LAMICTAL) 100 MG tablet Take 500 mg by mouth daily.     zonisamide (ZONEGRAN) 100 MG capsule Take 3 capsules (300 mg total) by mouth at bedtime. 90 capsule 0   No facility-administered medications prior to visit.   Past Medical History:  Diagnosis Date   Anxiety    History of chicken pox    Idiopathic intracranial hypertension 12/27/2015   Lumbar Puncture: opening pressure 26cm of water. EEG done 05/13/2017: normal MRI done 05/13/2017: IIH with distension of optic nerve sheath, partially empty cell Managed by Dr.Ahern 05/15/2017) and Dr. 05/15/2017 ([facility])   Intracranial hypertension    Morbid  obesity ([place])    Recurrent seizures ([place]) 05/02/2018   Seizures ([place]) 11/2015   Tobacco use 12/24/2016   Past Surgical History:  Procedure Laterality Date   BREAST BIOPSY     galblader removal     left knee surgery     LUMBAR PUNCTURE     fluid removal in brain.    wisdom teeth removal     No Known Allergies    Objective:    Physical Exam Vitals and nursing note reviewed.  Constitutional:      Appearance: Normal appearance.  HENT:     Head: Normocephalic.     Right Ear: Tympanic membrane normal.     Left Ear: Tympanic membrane normal.     Nose: Nose normal.     Mouth/Throat:     Mouth: Mucous  membranes are moist.  Eyes:     Pupils: Pupils are equal, round, and reactive to light.  Cardiovascular:     Rate and Rhythm: Normal rate and regular rhythm.  Pulmonary:     Effort: Pulmonary effort is normal.     Breath sounds: Normal breath sounds.  Musculoskeletal:        General: Normal range of motion.     Cervical back: Normal range of motion.  Lymphadenopathy:     Cervical: No cervical adenopathy.  Skin:    General: Skin is warm and dry.  Neurological:     Mental Status: She is alert.  Psychiatric:        Mood and Affect: Mood normal.        Behavior: Behavior normal.    BP (!) 130/90 (BP Location: Left Arm, Patient Position: Sitting, Cuff Size: Large)   Pulse 90   Temp (!) 97.5 F (36.4 C) (Temporal)   Ht 5\' 6"  (1.676 m)   Wt 244 lb 8 oz (110.9 kg)   LMP 02/10/2022 (Exact Date)   SpO2 97%   BMI 39.46 kg/m  Wt Readings from Last 3 Encounters:  02/12/22 244 lb 8 oz (110.9 kg)  06/04/21 255 lb 3.2 oz (115.8 kg)  04/10/21 256 lb 12.8 oz (116.5 kg)       Jeanie Sewer, NP

## 2022-02-12 NOTE — Progress Notes (Deleted)
Phone [phone]  Subjective:   Patient is a 46 y.o. female presenting for annual physical.    No chief complaint on file.   See problem oriented charting- ROS- full  review of systems was completed and negative except for: ***noted in HPI above.  The following were reviewed and entered/updated in epic: Past Medical History:  Diagnosis Date  . Anxiety   . History of chicken pox   . Idiopathic intracranial hypertension 12/27/2015   Lumbar Puncture: opening pressure 26cm of water. 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 Opal Sidles ([facility])  . Intracranial hypertension   . Morbid obesity ([place])   . Seizures ([place]) 11/2015  . Tobacco use 12/24/2016   Patient Active Problem List   Diagnosis Date Noted  . High risk medication use 06/04/2021  . Elevated blood pressure reading 04/03/2021  . Vitamin D deficiency 04/03/2021  . Chronic pain of both knees 02/27/2021  . Primary osteoarthritis of both knees 02/27/2021  . Recurrent seizures ([place]) 05/02/2018  . Adjustment disorder with mixed anxiety and depressed mood 07/09/2017  . Tobacco use 12/24/2016  . Morbid obesity ([place])   . Partial idiopathic epilepsy with seizures of localized onset, not intractable, with status epilepticus ([place]) 11/28/2015   Past Surgical History:  Procedure Laterality Date  . BREAST BIOPSY    . galblader removal    . left knee surgery    . LUMBAR PUNCTURE     fluid removal in brain.   Marland Kitchen wisdom teeth removal      Family History  Problem Relation Age of Onset  . Diabetes Mother   . Hypertension Mother   . Arthritis Mother   . Diabetes Father   . Hypertension Father   . Glaucoma Father   . Cataracts Father   . Cancer Sister        breast cancer  . Breast cancer Sister 42    Medications- reviewed and updated Current Outpatient Medications  Medication Sig Dispense Refill  .  Cholecalciferol (VITAMIN D3) 50 MCG (2000 UT) capsule Take 1 capsule (2,000 Units total) by mouth daily. Start daily dose 1 WEEK after your last weekly dose of 50,000units. (Patient taking differently: Take 10,000 Units by mouth daily. Start daily 10,000 units daily.) 30 capsule 5  . diclofenac Sodium (VOLTAREN) 1 % GEL Apply 4 g topically 4 (four) times daily. To affected joint. 100 g 11  . lamoTRIgine (LAMICTAL) 100 MG tablet Take 500 mg by mouth daily.    Marland Kitchen zonisamide (ZONEGRAN) 100 MG capsule Take 3 capsules (300 mg total) by mouth at bedtime. 90 capsule 0   No current facility-administered medications for this visit.    Allergies-reviewed and updated No Known Allergies  Social History   Social History Narrative   Occasionally drinks tea     Objective:  There were no vitals taken for this visit. Physical Exam Vitals and nursing note reviewed.  Constitutional:      Appearance: Normal appearance.  HENT:     Head: Normocephalic.     Right Ear: Tympanic membrane normal.     Left Ear: Tympanic membrane normal.     Nose: Nose normal.     Mouth/Throat:     Mouth: Mucous membranes are moist.  Eyes:     Pupils: Pupils are equal, round, and reactive to light.  Cardiovascular:     Rate and Rhythm: Normal rate and regular rhythm.  Pulmonary:     Effort: Pulmonary effort is normal.     Breath sounds: Normal breath sounds.  Musculoskeletal:        General: Normal range of motion.     Cervical back: Normal range of motion.  Lymphadenopathy:     Cervical: No cervical adenopathy.  Skin:    General: Skin is warm and dry.  Neurological:     Mental Status: She is alert.  Psychiatric:        Mood and Affect: Mood normal.        Behavior: Behavior normal.     Assessment and Plan   Health Maintenance counseling: 1. Anticipatory guidance: Patient counseled regarding regular dental exams q6 months, eye exams,  avoiding smoking and second hand smoke, limiting alcohol to 1 beverage per  day, no illicit drugs.   2. Risk factor reduction:  Advised patient of need for regular exercise and diet rich with fruits and vegetables to reduce risk of heart attack and stroke. Exercise- ***.  Wt Readings from Last 3 Encounters:  06/04/21 255 lb 3.2 oz (115.8 kg)  04/10/21 256 lb 12.8 oz (116.5 kg)  04/03/21 256 lb (116.1 kg)   3. Immunizations/screenings/ancillary studies  There is no immunization history on file for this patient. Health Maintenance Due  Topic Date Due  . Hepatitis C Screening  Never done  . TETANUS/TDAP  Never done  . INFLUENZA VACCINE  Never done    4. Cervical cancer screening- *** 5. Breast cancer screening-  mammogram *** 6. Colon cancer screening - *** 7. Skin cancer screening- advised regular sunscreen use. Denies worrisome, changing, or new skin lesions.  8. Birth control/STD check- *** 9. Osteoporosis screening- *** 10. Alcohol screening: *** 11. Smoking associated screening (lung cancer screening, AAA screen 65-75, UA)- *** smoker- ***ppd  Problem List Items Addressed This Visit   None   Recommended follow up: ***No follow-ups on file. Future Appointments  Date Time Provider Department Center  01/17/2023  2:00 PM [facility]    Lab/Order associations:fasting   Dulce Sellar, NP

## 2022-02-14 ENCOUNTER — Other Ambulatory Visit: Payer: Self-pay | Admitting: Family

## 2022-02-14 DIAGNOSIS — E559 Vitamin D deficiency, unspecified: Secondary | ICD-10-CM

## 2022-02-14 DIAGNOSIS — G40909 Epilepsy, unspecified, not intractable, without status epilepticus: Secondary | ICD-10-CM

## 2022-02-14 MED ORDER — VITAMIN D3 125 MCG (5000 UT) PO CAPS: 1.0000 | ORAL_CAPSULE | Freq: Every day | ORAL | 5 refills | Status: DC

## 2022-02-14 NOTE — Progress Notes (Signed)
Your Vitamin D level is a little low, so I will send in the prescription pill for you to take EVERY day, not weekly. Let me know if the pharmacy does not fill this for you.  Your glucose (sugar), electrolytes, kidney and liver function, blood count, and cholesterol numbers are all good.  Just your LDL (bad cholesterol) is slightly high.  Keep up the good work with controlling your diet and continue to try and shoot for 30 minutes of exercise daily!

## 2022-02-15 ENCOUNTER — Encounter: Payer: Self-pay | Admitting: Neurology

## 2022-03-13 ENCOUNTER — Ambulatory Visit (INDEPENDENT_AMBULATORY_CARE_PROVIDER_SITE_OTHER): Payer: Medicare Other | Admitting: Neurology

## 2022-03-13 ENCOUNTER — Encounter: Payer: Self-pay | Admitting: Neurology

## 2022-03-13 VITALS — BP 129/84 | HR 81 | Ht 66.5 in | Wt 249.8 lb

## 2022-03-13 DIAGNOSIS — G40909 Epilepsy, unspecified, not intractable, without status epilepticus: Secondary | ICD-10-CM | POA: Diagnosis not present

## 2022-03-13 DIAGNOSIS — G40009 Localization-related (focal) (partial) idiopathic epilepsy and epileptic syndromes with seizures of localized onset, not intractable, without status epilepticus: Secondary | ICD-10-CM | POA: Diagnosis not present

## 2022-03-13 DIAGNOSIS — Z79899 Other long term (current) drug therapy: Secondary | ICD-10-CM | POA: Diagnosis not present

## 2022-03-13 MED ORDER — ZONISAMIDE 100 MG PO CAPS: ORAL_CAPSULE | ORAL | 3 refills | Status: DC

## 2022-03-13 MED ORDER — LAMOTRIGINE 100 MG PO TABS: ORAL_TABLET | ORAL | 3 refills | Status: DC
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
## 2022-03-17 ENCOUNTER — Encounter: Payer: Self-pay | Admitting: Family

## 2022-03-17 ENCOUNTER — Encounter: Payer: Self-pay | Admitting: Neurology

## 2022-03-18 NOTE — Telephone Encounter (Signed)
I called pt to follow up on fall. Pt refused to go to the ED. I let pt know she atleast needs to follow up with Judeth Cornfield in office to be further assessed, Pt refused visit at this time and states she will call next week to schedule an appointment. I told pt if anything changes or gets worse she will need to to go to the ED. Pt gave a verbalized understanding.

## 2022-03-20 ENCOUNTER — Encounter: Payer: Self-pay | Admitting: Family

## 2022-03-20 ENCOUNTER — Ambulatory Visit (INDEPENDENT_AMBULATORY_CARE_PROVIDER_SITE_OTHER): Payer: Medicare Other | Admitting: Family

## 2022-03-20 VITALS — BP 130/78 | HR 83 | Temp 98.2°F | Ht 66.5 in | Wt 250.0 lb

## 2022-03-20 DIAGNOSIS — W101XXA Fall (on)(from) sidewalk curb, initial encounter: Secondary | ICD-10-CM | POA: Diagnosis not present

## 2022-03-20 DIAGNOSIS — S0990XA Unspecified injury of head, initial encounter: Secondary | ICD-10-CM | POA: Diagnosis not present
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
## 2022-04-15 ENCOUNTER — Ambulatory Visit
Admission: RE | Admit: 2022-04-15 | Discharge: 2022-04-15 | Disposition: A | Discharge status: Home / Self Care | Payer: Medicare Other | Source: Ambulatory Visit | Attending: Family | Admitting: Family

## 2022-04-15 DIAGNOSIS — Z1231 Encounter for screening mammogram for malignant neoplasm of breast: Secondary | ICD-10-CM | POA: Diagnosis not present

## 2022-04-19 NOTE — Progress Notes (Signed)
Mammogram normal! Recheck in 1-2 years.

## 2022-05-29 ENCOUNTER — Ambulatory Visit (INDEPENDENT_AMBULATORY_CARE_PROVIDER_SITE_OTHER): Payer: 59 | Admitting: Neurology

## 2022-05-29 ENCOUNTER — Encounter: Payer: Self-pay | Admitting: Neurology

## 2022-05-29 VITALS — BP 128/77 | HR 75 | Ht 66.0 in | Wt 253.4 lb

## 2022-05-29 DIAGNOSIS — G40009 Localization-related (focal) (partial) idiopathic epilepsy and epileptic syndromes with seizures of localized onset, not intractable, without status epilepticus: Secondary | ICD-10-CM

## 2022-05-29 DIAGNOSIS — G40909 Epilepsy, unspecified, not intractable, without status epilepticus: Secondary | ICD-10-CM

## 2022-05-29 MED ORDER — ZONISAMIDE 100 MG PO CAPS: ORAL_CAPSULE | ORAL | 3 refills | Status: DC

## 2022-05-29 MED ORDER — LAMOTRIGINE 100 MG PO TABS: ORAL_TABLET | ORAL | 3 refills | Status: DC

## 2022-05-29 NOTE — Progress Notes (Signed)
NEUROLOGY FOLLOW UP OFFICE NOTE  Kristina Coleman [id number] 06/21/1975  HISTORY OF PRESENT ILLNESS: I had the pleasure of seeing Kristina Coleman in follow-up in the neurology clinic on 05/29/2022.  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 She is alone in the office today. Records and images were personally reviewed where available. She denies any seizures since her last visit. Last GTC was in 2022. Last focal seizure with impaired awareness was in 2019. She was reporting episodes of epigastric sensation with no confusion, she has been monitoring them and noticing them usually around the time of her period (week prior). She has regular periods. She has also noticed more to occur when there is a lot of emotions. She denies any staring/unresponsive episodes, gaps in time, olfactory/gustatory hallucinations, focal numbness/tingling/weakness, myoclonic jerks. She had a fall in November when she tripped on uneven ground, no loss of consciousness. She had a headache that has resolved. No further headaches,no dizziness, vision changes. No side effects on Lamotrigine 100mg : 3 tabs in AM, 2.5 tabs in PM and Zonisamide 100mg : 2 caps qhs.    History on Initial Assessment 03/13/2022: This is a pleasant 47 year old right-handed woman with a history of left temporal lobe epilepsy presenting to establish care. She has seen neurologists Dr. Jaynee Eagles, Dr. Amalia Hailey, and since 2019 has been following at the [facility]psy clinic with Dr. Opal Sidles, last visit was 09/2020. Records were reviewed and will be summarized as follows. Seizures started at age 47. She thinks she may have had 1 or 2 seizures in her early teenage years when she had episodes in her sleep/dreams. At age 47, she started having nocturnal episodes where she would make a noise in her throat followed by generalized shaking, possible turn to the right. She would wake up feeling groggy, sore, with tongue bite or urinary incontinence. She would  sleep for 2 days. They were occurring more around the time of her period. She had rare staring episodes where she may look to the right, sometimes partially responding, other times unresponsive. She denies any prior warning symptoms. One time her sister told her she just stopped talking, froze with one hand in the air, unresponsive for a few seconds then she started talking again. Another time she paused and felt tired. Per notes, these would progress to bilateral arm jerking, foaming at the mouth and urinary incontinence. She reports 3 of these total, last was in 06/2017. She also has what she calls "mini-seizures" where she would get a very queasy feeling in her stomach, a headache with dizziness, cold sweats. She denies any associated confusion, loss of awareness, or speech difficulties. They would last a few minutes then she feels tired. She denies any olfactory/gustatory hallucinations, focal numbness/tingling/weakness, myoclonic jerks. She was initially started on Levetiracetam which caused headaches and mood swings. She has been on Lamotrigine 300mg  in AM, 250mg  in PM and Zonisamide 200mg  qhs since at least 2021 and feels this regimen works for her, no side effects. She reports her last nocturnal seizure was in 2022. The episodes with queasy stomach feeling have occurred a few times this year, last was in October 2023 while she was at the [facility] and there was a lot of activity going on. She reports triggers are when she is doing too much (cannot do more than 1 thing at a time), may be her period, sleep deprivation, or if she does not eat regularly.   She denies any regular headaches outside  of the seizures. Her vision gets blurry, no loss of vision. 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 She had a lumbar puncture with OP of 26 cm H2O. She will be seeing her eye doctor. No  dysarthria/dysphagia, neck/back pain, bladder dysfunction. She has some constipation. She does not have good memory, forgetting conversations or anything prior to when she started having seizures. She lives with her girlfriend. She drives without getting lost. She left the stove on one time. She has to set an alarm for her medications. Mood is pretty good. Sometimes she gets frustrated when she cannot work, she is on disability. Sleep is good. She had a sleep study in 2022 which was normal.   Epilepsy Risk Factors:  She has 3 maternal cousins with seizures. She had a normal birth and early development.  There is no history of febrile convulsions, CNS infections such as meningitis/encephalitis, significant traumatic brain injury, neurosurgical procedures.  Prior ASMs: Zonisamide 400mg  (unable to tolerate); Levetiracetam (headaches, mood swings)  Diagnostic Data: EEGs: Routine EEG (05/13/17): normal awake and asleep VEEG at [place] (4/13-4/14/20): rare left temporal slowing and sharp waves; one seizure with blinking, R hand automatisms, then secondary generalization from the left temporal lobe  MRI: MRI brain wo contrast at [facility] (05/13/17): no acute abnormality; hippocampi symmetric. 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 Constellation raises concern for idiopathic intracranial hypertension.   Lumbar Puncture at [facility] in 04/2017 showed opening pressure of 26 cm H2O. Fundoscopy at [facility] in 10/2017 was normal, no papilledema seen.  Sleep study 08/2020 normal   PAST MEDICAL HISTORY: Past Medical History:  Diagnosis Date   Anxiety    History of chicken pox    Idiopathic intracranial hypertension 12/27/2015   Lumbar  Puncture: opening pressure 26cm of water. 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 Opal Sidles ([facility])   Intracranial hypertension    Morbid obesity ([place])    Recurrent seizures ([place]) 05/02/2018   Seizures ([place]) 11/2015   Tobacco use 12/24/2016    MEDICATIONS: Current Outpatient Medications on File Prior to Visit  Medication Sig Dispense Refill   Cholecalciferol (VITAMIN D3) 125 MCG (5000 UT) CAPS Take 1 capsule (5,000 Units total) by mouth daily. 30 capsule 5   diclofenac Sodium (VOLTAREN) 1 % GEL Apply 4 g topically 4 (four) times daily. To affected joint. 100 g 11   lamoTRIgine (LAMICTAL) 100 MG tablet Take 3 tablets in morning, 2 and 1/2 tablets at night 495 tablet 3   zonisamide (ZONEGRAN) 100 MG capsule Take 2 capsules every night 180 capsule 3   No current facility-administered medications on file prior to visit.    ALLERGIES: No Known Allergies  FAMILY HISTORY: Family History  Problem Relation Age of Onset   Diabetes Mother    Hypertension Mother    Arthritis Mother    Diabetes Father    Hypertension Father    Glaucoma Father    Cataracts Father    Cancer Sister        breast cancer   Breast cancer Sister  52    SOCIAL HISTORY: Social History   Socioeconomic History   Marital status: Significant Other    Spouse name: Not on file   Number of children: 0   Years of education: college   Highest education level: Not on file  Occupational History   Occupation: unemployed  Tobacco Use   Smoking status: Former    Packs/day: 0.25    Types: Cigarettes   Smokeless tobacco: Never  Vaping Use   Vaping Use: Never used  Substance and Sexual Activity   Alcohol use: Yes    Comment: on occasion   Drug use: Yes    Types: Marijuana    Comment: every day   Sexual activity: Yes    Birth control/protection: None  Other Topics Concern   Not on file  Social  History Narrative   Occasionally drinks tea    Are you right handed or left handed? Right handed    Are you currently employed ?  Disability    What is your current occupation? NA   Do you live at home alone? No   Who lives with you? Lives with girl friend   What type of home do you live in: 1 story or 2 story? Has steps  spilt level home       Social Determinants of Health   Financial Resource Strain: Low Risk  (01/11/2022)   Overall Financial Resource Strain (CARDIA)    Difficulty of Paying Living Expenses: Not hard at all  Food Insecurity: No Food Insecurity (01/11/2022)   Hunger Vital Sign    Worried About Running Out of Food in the Last Year: Never true    Ran Out of Food in the Last Year: Never true  Transportation Needs: No Transportation Needs (01/11/2022)   PRAPARE - Administrator, Civil Service (Medical): No    Lack of Transportation (Non-Medical): No  Physical Activity: Inactive (01/11/2022)   Exercise Vital Sign    Days of Exercise per Week: 0 days    Minutes of Exercise per Session: 0 min  Stress: Stress Concern Present (01/11/2022)   [employer] of Occupational Health - Occupational Stress Questionnaire    Feeling of Stress : To some extent  Social Connections: Moderately Isolated (01/11/2022)   Social Connection and Isolation Panel [NHANES]    Frequency of Communication with Friends and Family: More than three times a week    Frequency of Social Gatherings with Friends and Family: More than three times a week    Attends Religious Services: Never    Database administrator or Organizations: No    Attends Banker Meetings: Never    Marital Status: Living with partner  Intimate Partner Violence: Not At Risk (01/11/2022)   Humiliation, Afraid, Rape, and Kick questionnaire    Fear of Current or Ex-Partner: No    Emotionally Abused: No    Physically Abused: No    Sexually Abused: No     PHYSICAL EXAM: Vitals:   05/29/22 1539  BP:  128/77  Pulse: 75  SpO2: 100%   General: No acute distress Head:  Normocephalic/atraumatic Skin/Extremities: No rash, no edema Neurological Exam: alert and awake. No aphasia or dysarthria. Fund of knowledge is appropriate.  Attention and concentration are normal.   Cranial nerves: Pupils equal, round. Extraocular movements intact with no nystagmus. Visual fields full.  No facial asymmetry.  Motor: Bulk and tone normal, muscle strength 5/5 throughout with no pronator drift.   Finger to nose testing  intact.  Gait narrow-based and steady, able to tandem walk adequately.  Romberg negative.   IMPRESSION: This is a pleasant 47 yo RH woman with a history of left temporal lobe epilepsy. No convulsions since 2022, no focal seizures with loss of awareness since 2019.  She notices episodes of epigastric sensation with no loss of awareness around the time of her period. We discussed increasing Lamotrigine 100mg  to 3 tabs BID during the week prior to her predicted period, then back to her usual regimen of 3 tabs in AM, 2.5 tabs in PM the rest of the month. Continue Zonisamide 200mg  qhs. She will continue to keep a calendar of her symptoms on this regimen. She is aware of [place] driving laws to stop driving after a seizure until 6 months seizure-free. As part of her seizure workup in 2019, MRI brain showed findings concerning for idiopathic intracranial hypertension. CSF opening pressure was 26 cmH2O, eye exam in 2019 did not show papilledema. She denies any typical symptoms of IIH, continue follow-up with Ophthalmology, she is requesting for a referral. Follow-up in 6 months, call for any changes.    Thank you for allowing me to participate in her care.  Please do not hesitate to call for any questions or concerns.    Ellouise Newer, M.D.   CC: Jeanie Sewer, NP

## 2022-05-29 NOTE — Patient Instructions (Signed)
Good to see you.  On the week before your predicted period, take Lamotrigine 100mg : 3 tablets twice a day. Then resume regular regimen of 3 tablets in AM, 2 and 1/2 tablets in PM  2. Continue Zonisamide 100mg : take 2 capsules every night  3. Continue seizure/symptom calendar  4. Referral will be sent to an eye doctor  5. Follow-up in 6 months, call for any changes   Seizure Precautions: 1. If medication has been prescribed for you to prevent seizures, take it exactly as directed.  Do not stop taking the medicine without talking to your doctor first, even if you have not had a seizure in a long time.   2. Avoid activities in which a seizure would cause danger to yourself or to others.  Don't operate dangerous machinery, swim alone, or climb in high or dangerous places, such as on ladders, roofs, or girders.  Do not drive unless your doctor says you may.  3. If you have any warning that you may have a seizure, lay down in a safe place where you can't hurt yourself.    4.  No driving for 6 months from last seizure, as per [facility].   Please refer to the following link on the [place] website for more information: http://www.epilepsyfoundation.org/answerplace/Social/driving/drivingu.cfm   5.  Maintain good sleep hygiene. Avoid alcohol  6.  Notify your neurology if you are planning pregnancy or if you become pregnant.  7.  Contact your doctor if you have any problems that may be related to the medicine you are taking.  8.  Call 911 and bring the patient back to the ED if:        A.  The seizure lasts longer than 5 minutes.       B.  The patient doesn't awaken shortly after the seizure  C.  The patient has new problems such as difficulty seeing, speaking or moving  D.  The patient was injured during the seizure  E.  The patient has a temperature over 102 F (39C)  F.  The patient vomited and now is having trouble breathing

## 2022-05-30 ENCOUNTER — Other Ambulatory Visit: Payer: Self-pay

## 2022-05-30 ENCOUNTER — Telehealth: Payer: Self-pay

## 2022-05-30 DIAGNOSIS — H4710 Unspecified papilledema: Secondary | ICD-10-CM

## 2022-05-30 NOTE — Telephone Encounter (Signed)
Fax sent to [facility] eye care

## 2022-05-30 NOTE — Telephone Encounter (Signed)
-----  Message from Cameron Sprang, MD sent at 05/29/2022  4:51 PM EST ----- Regarding: pls send referral Pls send referral to [facility], note: prior MRI showed findings seen with pseudotumor cerebri, needs regular eye exams to monitor for papilledema. Thanks

## 2022-06-14 ENCOUNTER — Ambulatory Visit: Payer: Medicare Other | Admitting: Neurology

## 2022-06-27 DIAGNOSIS — G932 Benign intracranial hypertension: Secondary | ICD-10-CM | POA: Diagnosis not present

## 2022-08-01 ENCOUNTER — Encounter: Payer: Self-pay | Admitting: Family

## 2022-08-05 ENCOUNTER — Ambulatory Visit (INDEPENDENT_AMBULATORY_CARE_PROVIDER_SITE_OTHER): Payer: 59 | Admitting: Family

## 2022-08-05 ENCOUNTER — Encounter: Payer: Self-pay | Admitting: Family

## 2022-08-05 VITALS — BP 116/82 | HR 74 | Temp 97.1°F | Ht 66.0 in | Wt 254.5 lb

## 2022-08-05 DIAGNOSIS — S39012A Strain of muscle, fascia and tendon of lower back, initial encounter: Secondary | ICD-10-CM

## 2022-08-05 NOTE — Patient Instructions (Addendum)
It was very nice to see you today!   Try Lidocaine patches, 4%, over the counter, 1-2 per day. You can also use the Diclofenac gel you have for your knee and rub on your back 3-4 times per day. You can also try any other analgesic cream or patch, for example, Ben-gay, tiger balm, bio-freeze, etc. to help with the pain. Also ok to take generic Aleve, 1-2 tablets twice a day for 1-2 weeks, then stop. Do NOT do any heaving lifting or cleaning for the next week.     PLEASE NOTE:  If you had any lab tests please let us know if you have not heard back within a few days. You may see your results on MyChart before we have a chance to review them but we will give you a call once they are reviewed by [place]. If we ordered any referrals today, please let us know if you have not heard from their office within the next week.
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
## 2022-09-04 ENCOUNTER — Ambulatory Visit
Admission: RE | Admit: 2022-09-04 | Discharge: 2022-09-04 | Disposition: A | Discharge status: Home / Self Care | Payer: 59 | Source: Ambulatory Visit | Attending: Neurology | Admitting: Neurology

## 2022-09-04 ENCOUNTER — Telehealth: Payer: Self-pay

## 2022-09-04 DIAGNOSIS — Z79899 Other long term (current) drug therapy: Secondary | ICD-10-CM

## 2022-09-04 NOTE — Telephone Encounter (Signed)
-----   Message from Van Clines, MD sent at 09/04/2022  2:05 PM EDT ----- Pls let her know bone density scan is normal, thanks

## 2022-09-04 NOTE — Telephone Encounter (Signed)
Pt called an informed that bone density scan is normal

## 2022-11-27 ENCOUNTER — Other Ambulatory Visit: Payer: Self-pay | Admitting: Family

## 2022-11-27 DIAGNOSIS — E559 Vitamin D deficiency, unspecified: Secondary | ICD-10-CM

## 2022-12-10 ENCOUNTER — Encounter: Payer: Self-pay | Admitting: Family

## 2022-12-12 ENCOUNTER — Encounter (INDEPENDENT_AMBULATORY_CARE_PROVIDER_SITE_OTHER): Payer: Self-pay

## 2022-12-18 ENCOUNTER — Ambulatory Visit: Payer: 59 | Admitting: Family

## 2022-12-26 ENCOUNTER — Other Ambulatory Visit (HOSPITAL_COMMUNITY)
Admission: RE | Admit: 2022-12-26 | Discharge: 2022-12-26 | Disposition: A | Discharge status: Home / Self Care | Payer: 59 | Source: Ambulatory Visit | Attending: Family | Admitting: Family

## 2022-12-26 ENCOUNTER — Ambulatory Visit (INDEPENDENT_AMBULATORY_CARE_PROVIDER_SITE_OTHER): Payer: 59 | Admitting: Family

## 2022-12-26 VITALS — BP 126/86 | HR 69 | Temp 97.0°F | Ht 66.0 in | Wt 256.4 lb

## 2022-12-26 DIAGNOSIS — Z1211 Encounter for screening for malignant neoplasm of colon: Secondary | ICD-10-CM

## 2022-12-26 DIAGNOSIS — Z01419 Encounter for gynecological examination (general) (routine) without abnormal findings: Secondary | ICD-10-CM

## 2022-12-26 DIAGNOSIS — Z1151 Encounter for screening for human papillomavirus (HPV): Secondary | ICD-10-CM | POA: Diagnosis not present

## 2022-12-26 DIAGNOSIS — Z113 Encounter for screening for infections with a predominantly sexual mode of transmission: Secondary | ICD-10-CM
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
## 2022-12-27 ENCOUNTER — Ambulatory Visit (INDEPENDENT_AMBULATORY_CARE_PROVIDER_SITE_OTHER): Payer: 59 | Admitting: Neurology

## 2022-12-27 ENCOUNTER — Encounter: Payer: Self-pay | Admitting: Neurology

## 2022-12-27 VITALS — BP 125/83 | HR 90 | Ht 66.0 in | Wt 259.2 lb

## 2022-12-27 DIAGNOSIS — G40009 Localization-related (focal) (partial) idiopathic epilepsy and epileptic syndromes with seizures of localized onset, not intractable, without status epilepticus: Secondary | ICD-10-CM

## 2022-12-27 DIAGNOSIS — G40909 Epilepsy, unspecified, not intractable, without status epilepticus: Secondary | ICD-10-CM

## 2022-12-27 LAB — HIV ANTIBODY (ROUTINE TESTING W REFLEX): HIV 1&2 Ab, 4th Generation: NONREACTIVE

## 2022-12-27 LAB — RPR: RPR Ser Ql: NONREACTIVE

## 2022-12-27 MED ORDER — LAMOTRIGINE 100 MG PO TABS: ORAL_TABLET | ORAL | 3 refills | Status: DC

## 2022-12-27 MED ORDER — ZONISAMIDE 100 MG PO CAPS: ORAL_CAPSULE | ORAL | 3 refills | Status: DC

## 2022-12-27 NOTE — Patient Instructions (Signed)
Good to see you! Continue all your medications. Follow-up in 6 months, call for any changes.  Seizure Precautions: 1. If medication has been prescribed for you to prevent seizures, take it exactly as directed.  Do not stop taking the medicine without talking to your doctor first, even if you have not had a seizure in a long time.   2. Avoid activities in which a seizure would cause danger to yourself or to others.  Don't operate dangerous machinery, swim alone, or climb in high or dangerous places, such as on ladders, roofs, or girders.  Do not drive unless your doctor says you may.  3. If you have any warning that you may have a seizure, lay down in a safe place where you can't hurt yourself.    4.  No driving for 6 months from last seizure, as per [facility].   Please refer to the following link on the [place] website for more information: http://www.epilepsyfoundation.org/answerplace/Social/driving/drivingu.cfm   5.  Maintain good sleep hygiene. Avoid alcohol.  6.  Contact your doctor if you have any problems that may be related to the medicine you are taking.  7.  Call 911 and bring the patient back to the ED if:        A.  The seizure lasts longer than 5 minutes.       B.  The patient doesn't awaken shortly after the seizure  C.  The patient has new problems such as difficulty seeing, speaking or moving  D.  The patient was injured during the seizure  E.  The patient has a temperature over 102 F (39C)  F.  The patient vomited and now is having trouble breathing

## 2022-12-27 NOTE — Progress Notes (Signed)
NEUROLOGY FOLLOW UP OFFICE NOTE  Kristina Coleman [id number] 07/27/1975  HISTORY OF PRESENT ILLNESS: I had the pleasure of seeing Kristina Coleman in follow-up in the neurology clinic on 12/27/2022.  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 She is alone in the office today. Since her last visit, she denies any GTCs since 2022. She denies any focal seizures with loss of awareness since 2019, however still has episodes around the time of her period where she feels like she is overdoing something and has to sit and get herself together. She denies any confusion, she can talk and understand, feels a little dizzy, sweating but chilled. No nocturnal episodes. She is on Zonisamide 200mg  at bedtime and Lamotrigine 300mg  in AM, 250mg  in PM. We had discussed increasing dose to 300mg  BID around the time of her period, but it was not as helpful. She gets headaches around her cycle the week prior, she feels very tired and light sensitive where she cannot do much. If she tries to do too much, she gets a queasy feeling. She used to be on birth control pills but it caused constipation.    History on Initial Assessment 03/13/2022: This is a pleasant 46 year old right-handed woman with a history of left temporal lobe epilepsy presenting to establish care. She has seen neurologists Dr. Lucia Gaskins, Dr. Logan Bores, and since 2019 has been following at the [facility] with Dr. Sherlean Foot, last visit was 09/2020. Records were reviewed and will be summarized as follows. Seizures started at age 79. She thinks she may have had 1 or 2 seizures in her early teenage years when she had episodes in her sleep/dreams. At age 37, she started having nocturnal episodes where she would make a noise in her throat followed by generalized shaking, possible turn to the right. She would wake up feeling groggy, sore, with tongue bite or urinary incontinence. She would sleep for 2 days. They were occurring more around the time  of her period. She had rare staring episodes where she may look to the right, sometimes partially responding, other times unresponsive. She denies any prior warning symptoms. One time her sister told her she just stopped talking, froze with one hand in the air, unresponsive for a few seconds then she started talking again. Another time she paused and felt tired. Per notes, these would progress to bilateral arm jerking, foaming at the mouth and urinary incontinence. She reports 3 of these total, last was in 06/2017. She also has what she calls "mini-seizures" where she would get a very queasy feeling in her stomach, a headache with dizziness, cold sweats. She denies any associated confusion, loss of awareness, or speech difficulties. They would last a few minutes then she feels tired. She denies any olfactory/gustatory hallucinations, focal numbness/tingling/weakness, myoclonic jerks. She was initially started on Levetiracetam which caused headaches and mood swings. She has been on Lamotrigine 300mg  in AM, 250mg  in PM and Zonisamide 200mg  qhs since at least 2021 and feels this regimen works for her, no side effects. She reports her last nocturnal seizure was in 2022. The episodes with queasy stomach feeling have occurred a few times this year, last was in October 2023 while she was at the [facility] and there was a lot of activity going on. She reports triggers are when she is doing too much (cannot do more than 1 thing at a time), may be her period, sleep deprivation, or if she does not eat regularly.  She denies any regular headaches outside of the seizures. Her vision gets blurry, no loss of vision. 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 She had a lumbar puncture with OP of 26 cm H2O. She will be seeing her eye doctor. No dysarthria/dysphagia, neck/back pain, bladder dysfunction. She has some  constipation. She does not have good memory, forgetting conversations or anything prior to when she started having seizures. She lives with her girlfriend. She drives without getting lost. She left the stove on one time. She has to set an alarm for her medications. Mood is pretty good. Sometimes she gets frustrated when she cannot work, she is on disability. Sleep is good. She had a sleep study in 2022 which was normal.   Epilepsy Risk Factors:  She has 3 maternal cousins with seizures. She had a normal birth and early development.  There is no history of febrile convulsions, CNS infections such as meningitis/encephalitis, significant traumatic brain injury, neurosurgical procedures.  Prior ASMs: Zonisamide 400mg  (unable to tolerate); Levetiracetam (headaches, mood swings)  Diagnostic Data: EEGs: Routine EEG (05/13/17): normal awake and asleep VEEG at [facility] (4/13-4/14/20): rare left temporal slowing and sharp waves; one seizure with blinking, R hand automatisms, then secondary generalization from the left temporal lobe  MRI: MRI brain wo contrast at [facility] (05/13/17): no acute abnormality; hippocampi symmetric. 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 Constellation raises concern for idiopathic intracranial hypertension.   Lumbar Puncture at [facility] in 04/2017 showed opening pressure of 26 cm H2O. Fundoscopy at [facility] in 10/2017 was normal, no papilledema seen.  Sleep study 08/2020 normal  PAST MEDICAL HISTORY: Past Medical History:  Diagnosis Date   Anxiety    History of chicken pox    Idiopathic intracranial hypertension 12/27/2015   Lumbar Puncture: opening pressure 26cm of water. 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 Sherlean Foot ([facility])   Intracranial hypertension    Morbid obesity (HCC)    Recurrent seizures (HCC) 05/02/2018   Seizures (HCC) 11/2015   Tobacco use 12/24/2016    MEDICATIONS: Current Outpatient Medications on File Prior to Visit  Medication Sig Dispense Refill   Cholecalciferol (VITAMIN D3) 125 MCG (5000 UT) CAPS Take 1 capsule (5,000 Units total) by mouth daily. 30 capsule 5   diclofenac Sodium (VOLTAREN) 1 % GEL Apply 4 g topically 4 (four) times daily. To affected joint. (Patient taking differently: Apply 4 g topically as needed. To affected joint.) 100 g 11   lamoTRIgine (LAMICTAL) 100 MG tablet Take 3 tablets in morning, 2 and 1/2 tablets at night. On the week before period, take 3 tablets twice a day for 7 days, then resume 3 tabs in AM, 2 and 1/2 tablets regimen. 500 tablet 3   zonisamide (ZONEGRAN) 100 MG capsule Take 2 capsules every night 180 capsule 3   No current facility-administered medications on file prior to visit.    ALLERGIES: No Known Allergies  FAMILY HISTORY: Family History  Problem Relation Age of Onset   Diabetes Mother    Hypertension Mother  Arthritis Mother    Diabetes Father    Hypertension Father    Glaucoma Father    Cataracts Father    Cancer Sister        breast cancer   Breast cancer Sister 61    SOCIAL HISTORY: Social History   Socioeconomic History   Marital status: Significant Other    Spouse name: Not on file   Number of children: 0   Years of education: college   Highest education level: Associate degree: occupational, Scientist, product/process development, or vocational program  Occupational History   Occupation: unemployed  Tobacco Use   Smoking status: Former    Current packs/day: 0.25    Types: Cigarettes   Smokeless tobacco: Never  Vaping Use   Vaping status: Never Used  Substance and Sexual Activity   Alcohol use: Not  Currently    Comment: on occasion   Drug use: Yes    Types: Marijuana    Comment: every day   Sexual activity: Yes    Birth control/protection: None  Other Topics Concern   Not on file  Social History Narrative   Occasionally drinks tea    Are you right handed or left handed? Right handed    Are you currently employed ?  Disability    What is your current occupation? NA   Do you live at home alone? No   Who lives with you? Lives with girl friend   What type of home do you live in: 1 story or 2 story? Has steps  spilt level home       Social Determinants of Health   Financial Resource Strain: Low Risk  (12/26/2022)   Overall Financial Resource Strain (CARDIA)    Difficulty of Paying Living Expenses: Not very hard  Food Insecurity: Food Insecurity Present (12/26/2022)   Hunger Vital Sign    Worried About Running Out of Food in the Last Year: Sometimes true    Ran Out of Food in the Last Year: Sometimes true  Transportation Needs: No Transportation Needs (12/26/2022)   PRAPARE - Administrator, Civil Service (Medical): No    Lack of Transportation (Non-Medical): No  Physical Activity: Insufficiently Active (12/26/2022)   Exercise Vital Sign    Days of Exercise per Week: 2 days    Minutes of Exercise per Session: 30 min  Stress: No Stress Concern Present (12/26/2022)   [employer] of Occupational Health - Occupational Stress Questionnaire    Feeling of Stress : Only a little  Social Connections: Moderately Isolated (12/26/2022)   Social Connection and Isolation Panel [NHANES]    Frequency of Communication with Friends and Family: Three times a week    Frequency of Social Gatherings with Friends and Family: Patient declined    Attends Religious Services: Never    Database administrator or Organizations: No    Attends Banker Meetings: Never    Marital Status: Living with partner  Intimate Partner Violence: Not At Risk (01/11/2022)   Humiliation,  Afraid, Rape, and Kick questionnaire    Fear of Current or Ex-Partner: No    Emotionally Abused: No    Physically Abused: No    Sexually Abused: No     PHYSICAL EXAM: Vitals:   12/27/22 1357  BP: 125/83  Pulse: 90  SpO2: 99%   General: No acute distress Head:  Normocephalic/atraumatic Skin/Extremities: No rash, no edema Neurological Exam: alert and awake. No aphasia or dysarthria. Fund of knowledge is appropriate.   Attention and  concentration are normal.   Cranial nerves: Pupils equal, round. Extraocular movements intact with no nystagmus. Visual fields full.  No facial asymmetry.  Motor: Bulk and tone normal, muscle strength 5/5 throughout with no pronator drift.   Finger to nose testing intact.  Gait narrow-based and steady, able to tandem walk adequately.  Romberg negative.   IMPRESSION: This is a pleasant 47 yo RH woman with a history of left temporal lobe epilepsy. No convulsions since 2022, no focal seizures with loss of awareness since 2019. Around the time of her period, she would have more headaches and episodes where she feels queasy but no loss of awareness. She did not tolerate increasing Lamotrigine dose around time of period. Continue Lamotrigine 100mg  3 tabs in AM, 2.5 tabs in PM and Zonisamide 200mg  at bedtime. We may consider clobazam as prophylaxis for catemenial seizures, she would like to hold off for now. As part of her seizure workup in 2019, MRI brain showed findings concerning for idiopathic intracranial hypertension. CSF opening pressure was 26 cmH2O, eye exam in 2024 no disc edema, she does not have any typical symptoms of IIH, continue to monitor. She is aware of [place] driving laws to stop driving after a seizure until 6 months seizure-free. Follow-up in 6 months, call for any changes.    Thank you for allowing me to participate in her care.  Please do not hesitate to call for any questions or concerns.   Patrcia Dolly, M.D.   CC: Dulce Sellar, NP

## 2022-12-31 ENCOUNTER — Telehealth: Payer: Self-pay

## 2022-12-31 NOTE — Telephone Encounter (Signed)
Reached out via interested in PREP from flyer.  Explained program and location available. Can do Haroldine Laws MW 230p-345p class starting on Oct 21.  Will call her closer to start of class to do intake.  Given my cell for call back.

## 2023-01-02 LAB — CYTOLOGY - PAP
Adequacy: ABSENT
Chlamydia: NEGATIVE
Comment: NEGATIVE
Comment: NEGATIVE
Comment: NEGATIVE
Comment: NORMAL
Diagnosis: NEGATIVE
High risk HPV: NEGATIVE
Neisseria Gonorrhea: NEGATIVE
Trichomonas: NEGATIVE

## 2023-01-07 ENCOUNTER — Ambulatory Visit (INDEPENDENT_AMBULATORY_CARE_PROVIDER_SITE_OTHER): Payer: 59 | Admitting: Family

## 2023-01-07 ENCOUNTER — Encounter: Payer: Self-pay | Admitting: Family

## 2023-01-07 ENCOUNTER — Ambulatory Visit (INDEPENDENT_AMBULATORY_CARE_PROVIDER_SITE_OTHER): Payer: 59

## 2023-01-07 VITALS — Wt 258.0 lb

## 2023-01-07 DIAGNOSIS — Z0000 Encounter for general adult medical examination without abnormal findings: Secondary | ICD-10-CM | POA: Diagnosis not present

## 2023-01-07 DIAGNOSIS — M170 Bilateral primary osteoarthritis of knee: Secondary | ICD-10-CM | POA: Diagnosis not present
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
## 2023-01-07 NOTE — Patient Instructions (Signed)
Kristina Coleman , Thank you for taking time to come for your Medicare Wellness Visit. I appreciate your ongoing commitment to your health goals. Please review the following plan we discussed and let me know if I can assist you in the future.   Referrals/Orders/Follow-Ups/Clinician Recommendations: pt declined immunizations at this time   This is a list of the screening recommended for you and due dates:  Health Maintenance  Topic Date Due   DTaP/Tdap/Td vaccine (1 - Tdap) Never done   Colon Cancer Screening  Never done   Hepatitis C Screening  12/26/2023*   Medicare Annual Wellness Visit  01/07/2024   Pap Smear  12/25/2025   HIV Screening  Completed   HPV Vaccine  Aged Out   Flu Shot  Discontinued   COVID-19 Vaccine  Discontinued  *Topic was postponed. The date shown is not the original due date.    Advanced directives: (Declined) Advance directive discussed with you today. Even though you declined this today, please call our office should you change your mind, and we can give you the proper paperwork for you to fill out.  Next Medicare Annual Wellness Visit scheduled for next year: Yes

## 2023-01-10 ENCOUNTER — Encounter: Payer: Self-pay | Admitting: Internal Medicine

## 2023-01-22 ENCOUNTER — Encounter: Payer: Self-pay | Admitting: Neurology

## 2023-01-28 ENCOUNTER — Other Ambulatory Visit: Payer: Self-pay | Admitting: Neurology

## 2023-01-28 DIAGNOSIS — G40009 Localization-related (focal) (partial) idiopathic epilepsy and epileptic syndromes with seizures of localized onset, not intractable, without status epilepticus: Secondary | ICD-10-CM

## 2023-01-31 ENCOUNTER — Telehealth: Payer: Self-pay

## 2023-01-31 NOTE — Telephone Encounter (Signed)
Call from pt. Can do intake on 10/16 at 230p at [facility]. Will meet pt in lobby.

## 2023-01-31 NOTE — Telephone Encounter (Signed)
VMT pt requesting call back to schedule intake appt for PREP class starting on 02/17/23.
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
## 2023-02-17 ENCOUNTER — Encounter: Payer: Self-pay | Admitting: Neurology

## 2023-02-17 ENCOUNTER — Encounter: Payer: Self-pay | Admitting: Family

## 2023-02-19 NOTE — Progress Notes (Signed)
[facility] PREP Weekly Session  Patient Details  Name: Kristina Coleman MRN: [id number] Date of Birth: May 11, 1975 Age: 47 y.o. PCP: Dulce Sellar, NP  There were no vitals filed for this visit.   [facility] Weekly seesion - 02/19/23 1500       [facility] "PREP" Location   [facility] "PREP" Location [facility]      Weekly Session   Topic Discussed Goal setting and welcome to the program   Fit testing done   Classes attended to date 2             Bonnye Fava 02/19/2023, 3:19 PM

## 2023-02-24 NOTE — Progress Notes (Signed)
[facility] PREP Weekly Session  Patient Details  Name: Kristina Coleman MRN: [id number] Date of Birth: [birth year] Age: 47 y.o. PCP: Dulce Sellar, NP  Vitals:   02/24/23 1448  Weight: 263 lb (119.3 kg)     [facility] Weekly seesion - 02/24/23 1400       [facility] "PREP" Location   [facility] "PREP" Location [facility]      Weekly Session   Topic Discussed Other ways to be active;Importance of resistance training   scale of perceived exertion   Classes attended to date 3             Bonnye Fava 02/24/2023, 2:49 PM

## 2023-02-27 ENCOUNTER — Ambulatory Visit: Payer: 59

## 2023-02-27 VITALS — Ht 66.0 in | Wt 260.0 lb

## 2023-02-27 DIAGNOSIS — Z1211 Encounter for screening for malignant neoplasm of colon: Secondary | ICD-10-CM

## 2023-02-27 MED ORDER — NA SULFATE-K SULFATE-MG SULF 17.5-3.13-1.6 GM/177ML PO SOLN
1.0000 | Freq: Once | ORAL | 0 refills | Status: AC
Start: 2023-02-27 — End: 2023-02-27

## 2023-02-27 NOTE — Progress Notes (Signed)
Pt's name and DOB verified at the beginning of the pre-visit wit 2 identifiers  Pt denies any difficulty with ambulating,sitting, laying down or rolling side to side  Gave both [facility] main # and MD on call # prior to instructions.   No egg or soy allergy known to patient   No issues known to pt with past sedation with any surgeries or procedures  Pt denies having issues being intubated  Patient denies ever being intubated Pt has no issues moving head neck or swallowing  No FH of Malignant Hyperthermia  Pt is not on diet pills or shots  Pt is not on home 02   Pt is not on blood thinners  Pt has frequent issues with constipation RN instructed pt to use Miralax per bottles instructions a week before prep days. Pt states they will  Pt is not on dialysis  Pt denise any abnormal heart rhythms   Pt denies any upcoming cardiac testing  Pt encouraged to use to use Singlecare or Goodrx to reduce cost   Patient's chart reviewed by Cathlyn Parsons CNRA prior to pre-visit and patient appropriate for the [facility].  Pre-visit completed and red dot placed by patient's name on their procedure day (on provider's schedule).  .  Visit by phone Instructed pt not to smoke Marijuana day before and day of procedure. Pt staes she will.  Pt states weight is 260 lb  Instructed pt why it is important to and  to call if they have any changes in health or new medications. Directed them to the # given and on instructions.     Instructions reviewed with pt and pt states understanding. Instructed to review again prior to procedure. Pt states they will.   Instructions sent by mail with coupon and by my chart

## 2023-03-05 ENCOUNTER — Other Ambulatory Visit: Payer: Self-pay | Admitting: Neurology

## 2023-03-05 DIAGNOSIS — G40009 Localization-related (focal) (partial) idiopathic epilepsy and epileptic syndromes with seizures of localized onset, not intractable, without status epilepticus: Secondary | ICD-10-CM

## 2023-03-10 NOTE — Progress Notes (Signed)
[facility] PREP Weekly Session  Patient Details  Name: Kristina Coleman MRN: [id number] Date of Birth: 1975-07-16 Age: 47 y.o. PCP: Dulce Sellar, NP  Vitals:   03/10/23 1300  Weight: 262 lb (118.8 kg)     [facility] Weekly seesion - 03/10/23 1400       [facility] "PREP" Location   [facility] "PREP" [facility]      Weekly Session   Topic Discussed Healthy eating tips    Minutes exercised this week 200 minutes    Classes attended to date 6             Bonnye Fava 03/10/2023, 2:44 PM

## 2023-03-11 ENCOUNTER — Telehealth: Payer: Self-pay | Admitting: Internal Medicine

## 2023-03-11 NOTE — Telephone Encounter (Signed)
Inbound call from patient, would like to discuss how to hold seizure medication. Please advise.

## 2023-03-11 NOTE — Telephone Encounter (Signed)
Spoke with patient - all questions answered

## 2023-03-14 ENCOUNTER — Ambulatory Visit: Payer: 59 | Admitting: Internal Medicine

## 2023-03-14 ENCOUNTER — Encounter: Payer: Self-pay | Admitting: Internal Medicine

## 2023-03-14 VITALS — BP 129/82 | HR 67 | Temp 98.0°F | Resp 18 | Ht 66.0 in | Wt 260.0 lb

## 2023-03-14 DIAGNOSIS — D122 Benign neoplasm of ascending colon: Secondary | ICD-10-CM

## 2023-03-14 DIAGNOSIS — D125 Benign neoplasm of sigmoid colon: Secondary | ICD-10-CM

## 2023-03-14 DIAGNOSIS — D124 Benign neoplasm of descending colon: Secondary | ICD-10-CM

## 2023-03-14 DIAGNOSIS — Z1211 Encounter for screening for malignant neoplasm of colon: Secondary | ICD-10-CM | POA: Diagnosis not present

## 2023-03-14 DIAGNOSIS — K635 Polyp of colon: Secondary | ICD-10-CM

## 2023-03-14 MED ORDER — SODIUM CHLORIDE 0.9 % IV SOLN: 500.0000 mL | INTRAVENOUS | Status: DC

## 2023-03-14 NOTE — Progress Notes (Signed)
Sedate, gd SR, tolerated procedure well, VSS, report to RN 

## 2023-03-14 NOTE — Progress Notes (Signed)
Called to room to assist during endoscopic procedure.  Patient ID and intended procedure confirmed with present staff. Received instructions for my participation in the procedure from the performing physician.  

## 2023-03-14 NOTE — Op Note (Signed)
[facility] Patient Name: Kristina Coleman Procedure Date: 03/14/2023 11:04 AM MRN: [id number] Endoscopist: Madelyn Brunner [place] , , [id number] Age: 47 Referring MD:  Date of Birth: 10-27-1975 Gender: Female Account #: [id number] Procedure:                Colonoscopy Indications:              Screening for colorectal malignant neoplasm, This                            is the patient's first colonoscopy Medicines:                Monitored Anesthesia Care Procedure:                Pre-Anesthesia Assessment:                           - Prior to the procedure, a History and Physical                            was performed, and patient medications and                            allergies were reviewed. The patient's tolerance of                            previous anesthesia was also reviewed. The risks                            and benefits of the procedure and the sedation                            options and risks were discussed with the patient.                            All questions were answered, and informed consent                            was obtained. Prior Anticoagulants: The patient has                            taken no anticoagulant or antiplatelet agents. ASA                            Grade Assessment: III - A patient with severe                            systemic disease. After reviewing the risks and                            benefits, the patient was deemed in satisfactory                            condition to undergo the procedure.  After obtaining informed consent, the colonoscope                            was passed under direct vision. Throughout the                            procedure, the patient's blood pressure, pulse, and                            oxygen saturations were monitored continuously. The                            Olympus Scope SN: [id number] was introduced through                            the anus and  advanced to the the terminal ileum.                            The colonoscopy was performed without difficulty.                            The patient tolerated the procedure well. The                            quality of the bowel preparation was excellent. The                            terminal ileum, ileocecal valve, appendiceal                            orifice, and rectum were photographed. Scope In: 11:09:54 AM Scope Out: 11:29:40 AM Scope Withdrawal Time: 0 hours 10 minutes 13 seconds  Total Procedure Duration: 0 hours 19 minutes 46 seconds  Findings:                 The terminal ileum appeared normal.                           A 4 mm polyp was found in the ascending colon. The                            polyp was sessile. The polyp was removed with a                            cold snare. Resection and retrieval were complete.                           Two sessile polyps were found in the sigmoid colon                            and descending colon. The polyps were 3 to 6 mm in  size. These polyps were removed with a cold snare.                            Resection and retrieval were complete.                           A 12 mm polyp was found in the sigmoid colon. The                            polyp was pedunculated. The polyp was removed with                            a hot snare. Resection and retrieval were complete.                           An 8 mm polyp was found in the sigmoid colon. The                            polyp was pedunculated. The polyp was removed with                            a hot snare. Resection and retrieval were complete.                           Non-bleeding internal hemorrhoids were found during                            retroflexion. Complications:            No immediate complications. Estimated Blood Loss:     Estimated blood loss was minimal. Impression:               - The examined portion of the ileum was  normal.                           - One 4 mm polyp in the ascending colon, removed                            with a cold snare. Resected and retrieved.                           - Two 3 to 6 mm polyps in the sigmoid colon and in                            the descending colon, removed with a cold snare.                            Resected and retrieved.                           - One 12 mm polyp in the sigmoid colon, removed  with a hot snare. Resected and retrieved.                           - One 8 mm polyp in the sigmoid colon, removed with                            a hot snare. Resected and retrieved.                           - Non-bleeding internal hemorrhoids. Recommendation:           - Discharge patient to home (with escort).                           - Await pathology results.                           - The findings and recommendations were discussed                            with the patient. Dr Particia Lather "Alan Ripper" Leonides Schanz,  03/14/2023 11:37:00 AM

## 2023-03-14 NOTE — Progress Notes (Signed)
Pt's states no medical or surgical changes since previsit or office visit. 

## 2023-03-14 NOTE — Progress Notes (Signed)
GASTROENTEROLOGY PROCEDURE H&P NOTE   Primary Care Physician: Dulce Sellar, NP    Reason for Procedure:   Colon cancer screening  Plan:    Colonoscopy   Patient is appropriate for endoscopic procedure(s) in the ambulatory ([facility]) setting.  The nature of the procedure, as well as the risks, benefits, and alternatives were carefully and thoroughly reviewed with the patient. Ample time for discussion and questions allowed. The patient understood, was satisfied, and agreed to proceed.     HPI: Kristina Coleman is a 47 y.o. female who presents for colonoscopy for colon cancer screening. Denies blood in stools, changes in bowel habits, or unintentional weight loss. Denies family history of colon cancer.  Past Medical History:  Diagnosis Date   Anxiety    Arthritis    History of chicken pox    Idiopathic intracranial hypertension 12/27/2015   Lumbar Puncture: opening pressure 26cm of water. 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 Sherlean Foot ([facility])   Intracranial hypertension    Morbid obesity (HCC)    Recurrent seizures (HCC) 05/02/2018   Seizures (HCC) 11/2015   Tobacco use 12/24/2016    Past Surgical History:  Procedure Laterality Date   BREAST BIOPSY     CHOLECYSTECTOMY     left knee surgery     LUMBAR PUNCTURE     fluid removal in brain.    wisdom teeth removal      Prior to Admission medications   Medication Sig Start Date End Date Taking? Authorizing Provider  lamoTRIgine (LAMICTAL) 100 MG tablet TAKE 3 TABLETS EVERY MORNING and TAKE 2 & 1/2 TABLETS AT NIGHT - ON THE WEEK BEFORE period, TAKE 3 TABLETS 2 TIMES DAILY FOR 7 DAYS 01/28/23  Yes Van Clines, MD  zonisamide (ZONEGRAN) 100 MG capsule TAKE 2 CAPSULES BY MOUTH EVERY EVENING 03/05/23  Yes Van Clines, MD  Cholecalciferol (VITAMIN D3) 125 MCG (5000 UT) CAPS Take 1 capsule (5,000 Units  total) by mouth daily. Patient not taking: Reported on 02/27/2023 02/14/22   Dulce Sellar, NP  diclofenac Sodium (VOLTAREN) 1 % GEL Apply 4 g topically 4 (four) times daily. To affected joint. Patient taking differently: Apply 4 g topically as needed. To affected joint. 02/27/21   Rodolph Bong, MD    Current Outpatient Medications  Medication Sig Dispense Refill   lamoTRIgine (LAMICTAL) 100 MG tablet TAKE 3 TABLETS EVERY MORNING and TAKE 2 & 1/2 TABLETS AT NIGHT - ON THE WEEK BEFORE period, TAKE 3 TABLETS 2 TIMES DAILY FOR 7 DAYS 500 tablet 1   zonisamide (ZONEGRAN) 100 MG capsule TAKE 2 CAPSULES BY MOUTH EVERY EVENING 180 capsule 1   Cholecalciferol (VITAMIN D3) 125 MCG (5000 UT) CAPS Take 1 capsule (5,000 Units total) by mouth daily. (Patient not taking: Reported on 02/27/2023) 30 capsule 5   diclofenac Sodium (VOLTAREN) 1 % GEL Apply 4 g topically 4 (four) times daily. To affected joint. (Patient taking differently: Apply 4 g topically as needed. To affected joint.) 100 g 11   Current Facility-Administered Medications  Medication Dose Route Frequency Provider Last Rate Last Admin   0.9 %  sodium chloride infusion  500 mL Intravenous Continuous Imogene Burn, MD        Allergies as of 03/14/2023   (No Known Allergies)    Family History  Problem Relation Age of Onset   Diabetes Mother    Hypertension  Mother    Arthritis Mother    Diabetes Father    Hypertension Father    Glaucoma Father    Cataracts Father    Cancer Sister        breast cancer   Breast cancer Sister 39   Colon cancer Neg Hx    Colon polyps Neg Hx    Esophageal cancer Neg Hx    Rectal cancer Neg Hx    Stomach cancer Neg Hx     Social History   Socioeconomic History   Marital status: Significant Other    Spouse name: Not on file   Number of children: 0   Years of education: college   Highest education level: Associate degree: occupational, Scientist, product/process development, or vocational program  Occupational History    Occupation: unemployed  Tobacco Use   Smoking status: Former    Current packs/day: 0.25    Types: Cigarettes   Smokeless tobacco: Never  Vaping Use   Vaping status: Never Used  Substance and Sexual Activity   Alcohol use: Not Currently    Comment: on occasion   Drug use: Yes    Frequency: 14.0 times per week    Types: Marijuana    Comment: every day, last time 03/12/23   Sexual activity: Yes    Birth control/protection: None  Other Topics Concern   Not on file  Social History Narrative   Occasionally drinks tea    Are you right handed or left handed? Right handed    Are you currently employed ?  Disability    What is your current occupation? NA   Do you live at home alone? No   Who lives with you? Lives with girl friend   What type of home do you live in: 1 story or 2 story? Has steps  spilt level home       Social Determinants of Health   Financial Resource Strain: Low Risk  (01/06/2023)   Overall Financial Resource Strain (CARDIA)    Difficulty of Paying Living Expenses: Not hard at all  Food Insecurity: Food Insecurity Present (01/06/2023)   Hunger Vital Sign    Worried About Running Out of Food in the Last Year: Sometimes true    Ran Out of Food in the Last Year: Never true  Transportation Needs: No Transportation Needs (01/06/2023)   PRAPARE - Administrator, Civil Service (Medical): No    Lack of Transportation (Non-Medical): No  Physical Activity: Insufficiently Active (01/06/2023)   Exercise Vital Sign    Days of Exercise per Week: 3 days    Minutes of Exercise per Session: 30 min  Stress: No Stress Concern Present (01/06/2023)   [employer] of Occupational Health - Occupational Stress Questionnaire    Feeling of Stress : Only a little  Social Connections: Moderately Isolated (01/06/2023)   Social Connection and Isolation Panel [NHANES]    Frequency of Communication with Friends and Family: Three times a week    Frequency of Social Gatherings with  Friends and Family: Patient declined    Attends Religious Services: Never    Database administrator or Organizations: No    Attends Engineer, structural: Patient declined    Marital Status: Living with partner  Intimate Partner Violence: Not At Risk (01/07/2023)   Humiliation, Afraid, Rape, and Kick questionnaire    Fear of Current or Ex-Partner: No    Emotionally Abused: No    Physically Abused: No    Sexually Abused: No  Physical Exam: Vital signs in last 24 hours: BP (!) 165/99   Pulse 77   Temp 98 F (36.7 C) (Temporal)   Ht 5\' 6"  (1.676 m)   Wt 260 lb (117.9 kg)   LMP 03/14/2023 (Exact Date)   SpO2 98%   BMI 41.97 kg/m  GEN: NAD EYE: Sclerae anicteric ENT: MMM CV: Non-tachycardic Pulm: No increased work of breathing GI: Soft, NT/ND NEURO:  Alert & Oriented   Eulah Pont, MD [facility]  03/14/2023 10:53 AM

## 2023-03-14 NOTE — Patient Instructions (Signed)
-   Resume previous diet - Continue present medications. - Await pathology results - Discharge patient to home (with escort). - The findings and recommendations were discussed with the patient.     YOU HAD AN ENDOSCOPIC PROCEDURE TODAY AT THE [facility]:   Refer to the procedure report that was given to you for any specific questions about what was found during the examination.  If the procedure report does not answer your questions, please call your gastroenterologist to clarify.  If you requested that your care partner not be given the details of your procedure findings, then the procedure report has been included in a sealed envelope for you to review at your convenience later.  YOU SHOULD EXPECT: Some feelings of bloating in the abdomen. Passage of more gas than usual.  Walking can help get rid of the air that was put into your GI tract during the procedure and reduce the bloating. If you had a lower endoscopy (such as a colonoscopy or flexible sigmoidoscopy) you may notice spotting of blood in your stool or on the toilet paper. If you underwent a bowel prep for your procedure, you may not have a normal bowel movement for a few days.  Please Note:  You might notice some irritation and congestion in your nose or some drainage.  This is from the oxygen used during your procedure.  There is no need for concern and it should clear up in a day or so.  SYMPTOMS TO REPORT IMMEDIATELY:  Following lower endoscopy (colonoscopy or flexible sigmoidoscopy):  Excessive amounts of blood in the stool  Significant tenderness or worsening of abdominal pains  Swelling of the abdomen that is new, acute  Fever of 100F or higher  For urgent or emergent issues, a gastroenterologist can be reached at any hour by calling [phone]. Do not use MyChart messaging for urgent concerns.    DIET:  We do recommend a small meal at first, but then you may proceed to your regular diet.  Drink plenty of  fluids but you should avoid alcoholic beverages for 24 hours.  ACTIVITY:  You should plan to take it easy for the rest of today and you should NOT DRIVE or use heavy machinery until tomorrow (because of the sedation medicines used during the test).    FOLLOW UP: Our staff will call the number listed on your records the next business day following your procedure.  We will call around 7:15- 8:00 am to check on you and address any questions or concerns that you may have regarding the information given to you following your procedure. If we do not reach you, we will leave a message.     If any biopsies were taken you will be contacted by phone or by letter within the next 1-3 weeks.  Please call us at [phone] if you have not heard about the biopsies in 3 weeks.    SIGNATURES/CONFIDENTIALITY: You and/or your care partner have signed paperwork which will be entered into your electronic medical record.  These signatures attest to the fact that that the information above on your After Visit Summary has been reviewed and is understood.  Full responsibility of the confidentiality of this discharge information lies with you and/or your care-partner.

## 2023-03-17 ENCOUNTER — Telehealth: Payer: Self-pay

## 2023-03-17 NOTE — Progress Notes (Signed)
[facility] PREP Weekly Session  Patient Details  Name: Kristina Coleman MRN: [id number] Date of Birth: 10/19/1975 Age: 47 y.o. PCP: Dulce Sellar, NP  Vitals:   03/17/23 1444  Weight: 262 lb (118.8 kg)     [facility] Weekly seesion - 03/17/23 1400       [facility] "PREP" Location   [facility]      Weekly Session   Topic Discussed Health habits    Minutes exercised this week 70 minutes    Classes attended to date 7             Pam Jerral Bonito 03/17/2023, 2:46 PM

## 2023-03-17 NOTE — Telephone Encounter (Signed)
  Follow up Call-     03/14/2023   10:28 AM  Call back number  Post procedure Call Back phone  # [phone]  Permission to leave phone message Yes     Patient questions:  Do you have a fever, pain , or abdominal swelling? No. Pain Score  0 *  Have you tolerated food without any problems? Yes.    Have you been able to return to your normal activities? Yes.    Do you have any questions about your discharge instructions: Diet   No. Medications  No. Follow up visit  No.  Do you have questions or concerns about your Care? No.  Actions: * If pain score is 4 or above: No action needed, pain <4.

## 2023-03-18 ENCOUNTER — Encounter: Payer: Self-pay | Admitting: Internal Medicine

## 2023-03-18 LAB — SURGICAL PATHOLOGY

## 2023-03-24 NOTE — Progress Notes (Signed)
[facility] PREP Weekly Session  Patient Details  Name: Kristina Coleman MRN: [id number] Date of Birth: January 19, 1976 Age: 47 y.o. PCP: Dulce Sellar, NP  Vitals:   03/24/23 1451  Weight: 258 lb (117 kg)     [facility] Weekly seesion - 03/24/23 1400       [facility] "PREP" Location   [facility]      Weekly Session   Topic Discussed Health habits;Eating for the season   previous week topic  was healthing eating   Minutes exercised this week 130 minutes    Classes attended to date 42             Bonnye Fava 03/24/2023, 2:53 PM

## 2023-03-31 NOTE — Progress Notes (Signed)
[facility] PREP Weekly Session  Patient Details  Name: Kristina Coleman MRN: [id number] Date of Birth: [birth year] Age: 47 y.o. PCP: Dulce Sellar, NP  Vitals:   03/31/23 1300  Weight: 261 lb (118.4 kg)     [facility] Weekly seesion - 03/31/23 1500       [facility] "PREP" Location   [facility] "PREP" Location [facility]      Weekly Session   Topic Discussed Stress management and problem solving   progressive relaxation meditation, breathwork   Minutes exercised this week 120 minutes    Classes attended to date 44             Pam Jerral Bonito 03/31/2023, 3:04 PM

## 2023-04-07 ENCOUNTER — Other Ambulatory Visit: Payer: Self-pay | Admitting: Family

## 2023-04-07 DIAGNOSIS — Z0000 Encounter for general adult medical examination without abnormal findings: Secondary | ICD-10-CM

## 2023-04-07 NOTE — Progress Notes (Signed)
[facility] PREP Weekly Session  Patient Details  Name: Kristina Coleman MRN: [id number] Date of Birth: 15-Mar-1976 Age: 47 y.o. PCP: Dulce Sellar, NP  Vitals:   04/07/23 1447  Weight: 261 lb (118.4 kg)     [facility] Weekly seesion - 04/07/23 1400       [facility] "PREP" Location   [facility] "PREP[facility] Location [facility]      Weekly Session   Topic Discussed Expectations and non-scale victories    Minutes exercised this week 150 minutes    Classes attended to date 13             Bonnye Fava 04/07/2023, 2:49 PM

## 2023-04-14 NOTE — Progress Notes (Signed)
[facility] PREP Weekly Session  Patient Details  Name: Kristina Coleman MRN: [id number] Date of Birth: May 05, 1975 Age: 47 y.o. PCP: Dulce Sellar, NP  Vitals:   04/14/23 1439  Weight: 257 lb (116.6 kg)     [facility] Weekly seesion - 04/14/23 1400       [facility] "PREP" Location   [facility] "PREP[facility] Location [facility]      Weekly Session   Topic Discussed --   portions,  managing weight plateaus, label reading   Minutes exercised this week 125 minutes    Classes attended to date 3             Pam Jerral Bonito 04/14/2023, 2:41 PM

## 2023-04-28 NOTE — Progress Notes (Signed)
[facility] PREP Weekly Session  Patient Details  Name: Kristina Coleman MRN: [id number] Date of Birth: 1975/05/11 Age: 47 y.o. PCP: Dulce Sellar, NP  Vitals:   04/28/23 1428  Weight: 258 lb (117 kg)     [facility] Weekly seesion - 04/28/23 1400       [facility] "PREP" Location   [facility] "PREP[facility] Location [facility]      Weekly Session   Topic Discussed Finding support    Minutes exercised this week 180 minutes    Classes attended to date 17             Bonnye Fava 04/28/2023, 2:29 PM

## 2023-05-02 ENCOUNTER — Ambulatory Visit
Admission: RE | Admit: 2023-05-02 | Discharge: 2023-05-02 | Disposition: A | Discharge status: Home / Self Care | Payer: 59 | Source: Ambulatory Visit | Attending: Family | Admitting: Family

## 2023-05-02 DIAGNOSIS — Z1231 Encounter for screening mammogram for malignant neoplasm of breast: Secondary | ICD-10-CM | POA: Diagnosis not present

## 2023-05-02 DIAGNOSIS — Z0000 Encounter for general adult medical examination without abnormal findings: Secondary | ICD-10-CM

## 2023-05-05 NOTE — Progress Notes (Signed)
 [facility] PRE[facility] Weekly Session  Patient Details  Name: Kristina Coleman MRN: [id number] Date of Birth: 10-Nov-1975 Age: 48 y.o. PCP: Lucius Krabbe, NP  Vitals:   05/05/23 1511  Weight: 260 lb (117.9 kg)     [facility] Weekly seesion - 05/05/23 1500       [facility] PREP Location   [facility] PREP Location [facility]      Weekly Session   Topic Discussed Calorie breakdown    Minutes exercised this week 150 minutes    Classes attended to date 41             [name] 05/05/2023, 3:11 PM

## 2023-05-12 ENCOUNTER — Encounter: Payer: Self-pay | Admitting: *Deleted

## 2023-05-12 NOTE — Progress Notes (Signed)
 [facility] PRE[facility] Weekly Session  Patient Details  Name: Kristina Coleman MRN: [id number] Date of Birth: Mar 26, 1976 Age: 48 y.o. PCP: Lucius Krabbe, NP  Vitals:   05/12/23 1545  Weight: 257 lb (116.6 kg)     [facility] Weekly seesion - 05/12/23 1500       [facility] PREP Location   [facility] PREP Location [facility]      Weekly Session   Topic Discussed Hitting roadblocks   Discuss barriers and strategies for success. [facility] membership talk with Kellen. Review Goals and Activities sheet due next week.   Minutes exercised this week 135 minutes    Classes attended to date 9             Gorge Line 05/12/2023, 3:51 PM

## 2023-05-20 ENCOUNTER — Encounter: Payer: Self-pay | Admitting: Family

## 2023-05-20 NOTE — Telephone Encounter (Signed)
knee doctor, thx

## 2023-05-27 ENCOUNTER — Encounter: Payer: Self-pay | Admitting: *Deleted

## 2023-05-27 NOTE — Progress Notes (Signed)
[facility] PREP Evaluation  Patient Details  Name: Kristina Coleman MRN: [id number] Date of Birth: 11-10-1975 Age: 48 y.o. PCP: Dulce Sellar, NP  Vitals:   05/26/23 1300  BP: 118/74  Pulse: 89  Resp: 20  SpO2: 95%  Weight: 257 lb (116.6 kg)  Height: 5\' 6"  (1.676 m)     [facility] Eval - 05/26/23 1300       [facility] "PREP" Location   [facility] "PREP" Location [facility]      Referral    Referring Provider Hudnell    Reason for referral Obesitity/Overweight    Program Start Date 02/17/23    Program End Date 05/26/23      Measurement   Waist Circumference 52 inches    Waist Circumference End Program 51.25 inches    Hip Circumference 52.5 inches    Hip Circumference End Program 49.5 inches    Body fat 45.3 percent      Mobility and Daily Activities   I find it easy to walk up or down two or more flights of stairs. 3    I have no trouble taking out the trash. 4    I do housework such as vacuuming and dusting on my own without difficulty. 4    I can easily lift a gallon of milk (8lbs). 4    I can easily walk a mile. 4    I have no trouble reaching into high cupboards or reaching down to pick up something from the floor. 4    I do not have trouble doing out-door work such as Loss adjuster, chartered, raking leaves, or gardening. 2      Mobility and Daily Activities   I feel younger than my age. 3    I feel independent. 4    I feel energetic. 3    I live an active life.  3    I feel strong. 3    I feel healthy. 3    I feel active as other people my age. 3      How fit and strong are you.   Fit and Strong Total Score 47            Past Medical History:  Diagnosis Date   Anxiety    Arthritis    History of chicken pox    Idiopathic intracranial hypertension 12/27/2015   Lumbar Puncture: opening pressure 26cm of water. 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 Sherlean Foot ([facility])   Intracranial hypertension    Morbid obesity (HCC)    Recurrent seizures (HCC) 05/02/2018   Seizures (HCC) 11/2015   Tobacco use 12/24/2016   Past Surgical History:  Procedure Laterality Date   BREAST BIOPSY     CHOLECYSTECTOMY     left knee surgery     LUMBAR PUNCTURE     fluid removal in brain.    wisdom teeth removal     Social History   Tobacco Use  Smoking Status Former   Current packs/day: 0.25   Types: Cigarettes  Smokeless Tobacco Never    [name] 05/27/2023, 8:28 AM   PREP complete. Weight loss 5.6lbs, waist/hip inches lost 0.75/3.0" in 12 weeks. BP 140/90 at start of program, 118/74 today. Significant improvement in cardio march adding 145 steps in 2 minute test time by end of program. Improved balance and exercise stamina. She has had excellent attendance and has been exercising  independently at the [facility]. Great work [name]!

## 2023-05-30 ENCOUNTER — Other Ambulatory Visit: Payer: Self-pay

## 2023-05-30 ENCOUNTER — Other Ambulatory Visit (INDEPENDENT_AMBULATORY_CARE_PROVIDER_SITE_OTHER): Payer: 59

## 2023-05-30 ENCOUNTER — Encounter: Payer: Self-pay | Admitting: Orthopaedic Surgery

## 2023-05-30 ENCOUNTER — Ambulatory Visit (INDEPENDENT_AMBULATORY_CARE_PROVIDER_SITE_OTHER): Payer: 59 | Admitting: Orthopaedic Surgery

## 2023-05-30 DIAGNOSIS — M25562 Pain in left knee: Secondary | ICD-10-CM

## 2023-05-30 DIAGNOSIS — M79672 Pain in left foot: Secondary | ICD-10-CM

## 2023-05-30 DIAGNOSIS — G8929 Other chronic pain: Secondary | ICD-10-CM

## 2023-05-30 NOTE — Progress Notes (Signed)
Office Visit Note   Patient: Kristina Coleman           Date of Birth: 1976-02-28           MRN: [id number] Visit Date: 05/30/2023              Requested by: Dulce Sellar, NP [address] [place],  [place] [address] PCP: Dulce Sellar, NP   Assessment & Plan: Visit Diagnoses:  1. Chronic pain of left knee   2. 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  She has a symptomatic Baker's cyst.  Disease process explained and treatment options were given.  For now she will continue with nonsurgical conservative management with physical therapy and weight loss and home exercises for the plantar fasciitis.  Follow-up as needed.  Follow-Up Instructions: No follow-ups on file.   Orders:  Orders Placed This Encounter  Procedures   XR KNEE 3 VIEW RIGHT   Ambulatory referral to Physical Therapy   No orders of the defined types were placed in this encounter.     Procedures: No procedures performed   Clinical Data: No additional findings.   Subjective: Chief Complaint  Patient presents with   Right Knee - Pain   Left Knee - Pain    HPI  Kristina Coleman is a 48 year old female here for evaluation of left knee and left heel pain.  Denies any injuries.  Feels like the knee wants to give out.  Feels like she has pain behind the knee with a possible cyst.  This she also states that her left heel has been hurting since she has been working out.  She is in the process of losing weight.  Activity makes her symptoms worse and rest makes it better.  Denies any swelling.  Review of Systems  Constitutional: Negative.   HENT: Negative.    Eyes: Negative.   Respiratory: Negative.    Cardiovascular: Negative.   Endocrine: Negative.   Musculoskeletal: Negative.   Neurological: Negative.   Hematological: Negative.   Psychiatric/Behavioral: Negative.    All other systems reviewed and are  negative.    Objective: Vital Signs: There were no vitals taken for this visit.  Physical Exam Vitals and nursing note reviewed.  Constitutional:      Appearance: She is well-developed.  HENT:     Head: Atraumatic.     Nose: Nose normal.  Eyes:     Extraocular Movements: Extraocular movements intact.  Cardiovascular:     Pulses: Normal pulses.  Pulmonary:     Effort: Pulmonary effort is normal.  Abdominal:     Palpations: Abdomen is soft.  Musculoskeletal:     Cervical back: Neck supple.  Skin:    General: Skin is warm.     Capillary Refill: Capillary refill takes less than 2 seconds.  Neurological:     Mental Status: She is alert. Mental status is at baseline.  Psychiatric:        Behavior: Behavior normal.        Thought Content: Thought content normal.        Judgment: Judgment normal.     Ortho Exam Exam of the left ankle shows tenderness plantar fascial insertion.   Exam of the left knee shows valgus alignment.  Crepitus with range of motion.  Small Baker's cyst in the popliteal fossa. Specialty Comments:  No specialty comments available.  Imaging: XR KNEE 3  VIEW RIGHT Result Date: 05/30/2023 X-rays demonstrate age of danced tricompartment osteoarthritis with mild valgus alignment.    PMFS History: Patient Active Problem List   Diagnosis Date Noted   High risk medication use 06/04/2021   Elevated blood pressure reading 04/03/2021   Vitamin D deficiency 04/03/2021   Chronic pain of both knees 02/27/2021   Primary osteoarthritis of both knees 02/27/2021   Adjustment disorder with mixed anxiety and depressed mood 07/09/2017   Tobacco use 12/24/2016   Morbid obesity (HCC)    Partial idiopathic epilepsy with seizures of localized onset, not intractable, with status epilepticus (HCC) 11/28/2015   Past Medical History:  Diagnosis Date   Anxiety    Arthritis    History of chicken pox    Idiopathic intracranial hypertension 12/27/2015   Lumbar Puncture:  opening pressure 26cm of water. 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 Sherlean Foot ([facility])   Intracranial hypertension    Morbid obesity (HCC)    Recurrent seizures (HCC) 05/02/2018   Seizures (HCC) 11/2015   Tobacco use 12/24/2016    Family History  Problem Relation Age of Onset   Diabetes Mother    Hypertension Mother    Arthritis Mother    Diabetes Father    Hypertension Father    Glaucoma Father    Cataracts Father    Cancer Sister        breast cancer   Breast cancer Sister 59   Colon cancer Neg Hx    Colon polyps Neg Hx    Esophageal cancer Neg Hx    Rectal cancer Neg Hx    Stomach cancer Neg Hx     Past Surgical History:  Procedure Laterality Date   BREAST BIOPSY     CHOLECYSTECTOMY     left knee surgery     LUMBAR PUNCTURE     fluid removal in brain.    wisdom teeth removal     Social History   Occupational History   Occupation: unemployed  Tobacco Use   Smoking status: Former    Current packs/day: 0.25    Types: Cigarettes   Smokeless tobacco: Never  Vaping Use   Vaping status: Never Used  Substance and Sexual Activity   Alcohol use: Not Currently    Comment: on occasion   Drug use: Yes    Frequency: 14.0 times per week    Types: Marijuana    Comment: every day, last time 03/12/23   Sexual activity: Yes    Birth control/protection: None

## 2023-06-11 ENCOUNTER — Encounter: Payer: Self-pay | Admitting: Orthopaedic Surgery

## 2023-06-11 NOTE — Telephone Encounter (Signed)
Please send referral to [facility] for left knee OA and plantar fasciitis. Thanks.

## 2023-06-12 ENCOUNTER — Other Ambulatory Visit: Payer: Self-pay

## 2023-06-12 DIAGNOSIS — G8929 Other chronic pain: Secondary | ICD-10-CM

## 2023-06-23 ENCOUNTER — Ambulatory Visit: Payer: 59 | Attending: Orthopaedic Surgery | Admitting: Physical Therapy

## 2023-06-23 ENCOUNTER — Encounter: Payer: Self-pay | Admitting: Physical Therapy

## 2023-06-23 DIAGNOSIS — M79672 Pain in left foot: Secondary | ICD-10-CM | POA: Diagnosis not present

## 2023-06-23 DIAGNOSIS — M25561 Pain in right knee: Secondary | ICD-10-CM | POA: Insufficient documentation

## 2023-06-23 DIAGNOSIS — R262 Difficulty in walking, not elsewhere classified: Secondary | ICD-10-CM | POA: Diagnosis not present

## 2023-06-23 DIAGNOSIS — M25562 Pain in left knee: Secondary | ICD-10-CM | POA: Insufficient documentation

## 2023-06-23 DIAGNOSIS — G8929 Other chronic pain: Secondary | ICD-10-CM | POA: Insufficient documentation

## 2023-06-23 NOTE — Patient Instructions (Signed)

## 2023-06-23 NOTE — Therapy (Signed)
 OUTPATIENT PHYSICAL THERAPY LOWER EXTREMITY EVALUATION   Patient Name: Kristina Coleman MRN: [id number] DOB:[birth year], 48 y.o., female Today's Date: 06/23/2023  END OF SESSION:  PT End of Session - 06/23/23 1025     Visit Number 1    Number of Visits 12    Date for PT Re-Evaluation 08/04/23    Authorization Type UHC MCR and MCD    PT Start Time 1015    PT Stop Time 1100    PT Time Calculation (min) 45 min    Activity Tolerance Patient tolerated treatment well    Behavior During Therapy WFL for tasks assessed/performed             Past Medical History:  Diagnosis Date   Anxiety    Arthritis    History of chicken pox    Idiopathic intracranial hypertension 12/27/2015   Lumbar Puncture: opening pressure 26cm of water. 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 Sherlean Foot ([facility])   Intracranial hypertension    Morbid obesity (HCC)    Recurrent seizures (HCC) 05/02/2018   Seizures (HCC) 11/2015   Tobacco use 12/24/2016   Past Surgical History:  Procedure Laterality Date   BREAST BIOPSY     CHOLECYSTECTOMY     left knee surgery     LUMBAR PUNCTURE     fluid removal in brain.    wisdom teeth removal     Patient Active Problem List   Diagnosis Date Noted   High risk medication use 06/04/2021   Elevated blood pressure reading 04/03/2021   Vitamin D deficiency 04/03/2021   Chronic pain of both knees 02/27/2021   Primary osteoarthritis of both knees 02/27/2021   Adjustment disorder with mixed anxiety and depressed mood 07/09/2017   Tobacco use 12/24/2016   Morbid obesity (HCC)    Partial idiopathic epilepsy with seizures of localized onset, not intractable, with status epilepticus (HCC) 11/28/2015    PCP: Andree Coss   REFERRING PROVIDER: Sanjuan Dame DIAG: [phone] (ICD-10-CM) - Chronic pain of left knee M79.672 (ICD-10-CM) - Pain of left  heel  THERAPY DIAG:  Chronic pain of both knees  Heel pain, chronic, left  Difficulty in walking, not elsewhere classified  Rationale for Evaluation and Treatment: Rehabilitation  ONSET DATE: a few yrs 2019. Seizures   SUBJECTIVE:   SUBJECTIVE STATEMENT: Pt reports chronic knee pain, bilateral for several years.  She reports she began to have seizures several yes ago and that really set her back physically.  She was enrolled in the program between [facility] and the [facility] and was working on nutrition, exercise and healthy lifestyle habit and she loves it.  She is limited at the gym with what she can do in her lower body and does not want to make it worse.  It started on the L knee  She has had to change her workout routine.  Pain is in both posterior knees. Her Rt knee gives out at times. She has difficulty walking, squatting, and esp.  lunging Pain in L heel is when she has a day of working out and after sitting. Pain in AM in L heel with the first steps she takes.  She has not had injections, no bracing or foot orthotics. She does have flat feet.   PERTINENT HISTORY: Seizures, knee OA  PAIN:  Are you having pain? Yes: NPRS scale: Lt. Knee is OK , min pain 4/10 with bending.  Rt knee 6/10 with bending  Pain location: posterior knees  Pain description: tightness , soreness  Aggravating factors: walking, steps , lunge, bending knee  Relieving factors: cream, ice pack, rolling her L foot out Wears [employer] to work out in, these are good.   Pain increases with barefoot.     PRECAUTIONS: None  RED FLAGS: None   WEIGHT BEARING RESTRICTIONS: No  FALLS:  Has patient fallen in last 6 months? No  LIVING ENVIRONMENT: Lives with: lives with their partner Lives in: House/apartment Stairs: Yes: Internal: 2 flights  steps; on right going up Has following equipment at home: None  OCCUPATION: not working   PLOF: Independent  PATIENT GOALS: I want to be able to run someday.  Cont to workout.  Yoga.   NEXT MD VISIT: See Dr. Roda Shutters in April   OBJECTIVE:  Note: Objective measures were completed at Evaluation unless otherwise noted.  DIAGNOSTIC FINDINGS: tricompartment osteoarthritis with mild  valgus alignment.   PATIENT SURVEYS:  LEFS 61%  COGNITION: Overall cognitive status: Within functional limits for tasks assessed     SENSATION: WFL  EDEMA:  Circumferential: NT   MUSCLE LENGTH: Hamstrings: Right 30 deg; Left 25 deg   POSTURE:  genu valgus, pes planus   PALPATION: Pain with palpation to posterior knee and medial Rt knee joint line   LOWER EXTREMITY ROM:  Active ROM Right eval Left eval  Hip flexion    Hip extension    Hip abduction    Hip adduction    Hip internal rotation    Hip external rotation    Knee flexion 120 110 pain   Knee extension -10 -8  Ankle dorsiflexion -5 -5  Ankle plantarflexion    Ankle inversion    Ankle eversion     (Blank rows = not tested)  LOWER EXTREMITY MMT:  MMT Right eval Left eval  Hip flexion 4 4  Hip extension NT NT   Hip abduction NT on eval NT on eval   Hip adduction    Hip internal rotation    Hip external rotation    Knee flexion 5 5  Knee extension 5 5  Ankle dorsiflexion 5 5  Ankle plantarflexion 4 4  Ankle inversion 4 4  Ankle eversion 4 4   (Blank rows = not tested)  LOWER EXTREMITY SPECIAL TESTS:  NT   FUNCTIONAL TESTS:  5 times sit to stand: 10.4 sec  2 minute walk test: NT   Single leg balance Rt 10 sec, L 20 sec   GAIT: Distance walked: 150+ Assistive device utilized: None Level of assistance: Complete Independence Comments: no deviations                                                                                                                                 TREATMENT DATE:  [facility] Adult PT Treatment:  DATE: 06/23/23 Self Care:   Pt educated on inserts for plantar fasciitis, knee ankle alignment  HEP TBA  Lunging , gym  exercises Pool therapy    PATIENT EDUCATION:  Education details: see above  Person educated: Patient Education method: Programmer, multimedia, Demonstration, and Verbal cues Education comprehension: verbalized understanding, returned demonstration, and needs further education  HOME EXERCISE PROGRAM: Did not issue, discussed gym routine   ASSESSMENT:  CLINICAL IMPRESSION: Patient is a 48 y.o. female who was seen today for physical therapy evaluation and treatment for bilateral knee pain, L heel pain .   OBJECTIVE IMPAIRMENTS: decreased balance, decreased mobility, difficulty walking, decreased ROM, decreased strength, hypomobility, increased fascial restrictions, impaired flexibility, postural dysfunction, obesity, and pain.   ACTIVITY LIMITATIONS: standing, squatting, stairs, and locomotion level  PARTICIPATION LIMITATIONS: interpersonal relationship, community activity, and fitness  PERSONAL FACTORS: 1-2 comorbidities: seizures, knee OA   are also affecting patient's functional outcome.   REHAB POTENTIAL: Excellent  CLINICAL DECISION MAKING: Evolving/moderate complexity  EVALUATION COMPLEXITY: Moderate   GOALS: Goals reviewed with patient? Yes  SHORT TERM GOALS: Target date: 07/07/2023   Pt will be able to show I with HEP initial  Baseline: unknown Goal status: INITIAL  2.  Pt will be able to modify her gym routine to accommodate knee pain  Baseline:  Goal status: INITIAL   LONG TERM GOALS: Target date: 08/18/2023    Pt will be able to show I with final HEP upon discharge  Baseline:  Goal status: INITIAL  2.  Pt will improve LEFS score by 10 points  Baseline: 49/80 Goal status: INITIAL  3.  Pt will be able to report only min heel pain with AM steps or sitting 30 min  Baseline: severe  Goal status: INITIAL  4.  Pt will be able to walk in the community as needed with knee pain < 2/10 Baseline:  Goal status: INITIAL  5.  Pt will perform single leg balance to calf  raise x 10 in preparation for running  Baseline: 10-20 sec SLS  Goal status: INITIAL    PLAN:  PT FREQUENCY: 2x/week  PT DURATION: 6 weeks  PLANNED INTERVENTIONS: 97164- PT Re-evaluation, 97110-Therapeutic exercises, 97530- Therapeutic activity, 97112- Neuromuscular re-education, 97535- Self Care, 10272- Manual therapy, [id number]- Aquatic Therapy, Patient/Family education, Balance training, Taping, Dry Needling, Cryotherapy, and Moist heat  PLAN FOR NEXT SESSION: develop HEP and alternative ideas for gym ex for strength.  Aquatics week 2-3    Uno Esau, PT 06/23/2023, 1:22 PM   Karie Mainland, PT 06/23/23 1:22 PM Phone: [phone] Fax: [phone]

## 2023-06-30 NOTE — Therapy (Unsigned)
 OUTPATIENT PHYSICAL THERAPY LOWER EXTREMITY NOTE   Patient Name: Kristina Coleman MRN: [id number] DOB:[birth year], 48 y.o., female Today's Date: 07/01/2023  END OF SESSION:  PT End of Session - 07/01/23 1157     Visit Number 2    Number of Visits 12    Date for PT Re-Evaluation 08/04/23    Authorization Type UHC MCR and MCD    PT Start Time 1150    PT Stop Time 1232    PT Time Calculation (min) 42 min    Activity Tolerance Patient tolerated treatment well    Behavior During Therapy WFL for tasks assessed/performed              Past Medical History:  Diagnosis Date   Anxiety    Arthritis    History of chicken pox    Idiopathic intracranial hypertension 12/27/2015   Lumbar Puncture: opening pressure 26cm of water. 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 Sherlean Foot ([facility])   Intracranial hypertension    Morbid obesity (HCC)    Recurrent seizures (HCC) 05/02/2018   Seizures (HCC) 11/2015   Tobacco use 12/24/2016   Past Surgical History:  Procedure Laterality Date   BREAST BIOPSY     CHOLECYSTECTOMY     left knee surgery     LUMBAR PUNCTURE     fluid removal in brain.    wisdom teeth removal     Patient Active Problem List   Diagnosis Date Noted   High risk medication use 06/04/2021   Elevated blood pressure reading 04/03/2021   Vitamin D deficiency 04/03/2021   Chronic pain of both knees 02/27/2021   Primary osteoarthritis of both knees 02/27/2021   Adjustment disorder with mixed anxiety and depressed mood 07/09/2017   Tobacco use 12/24/2016   Morbid obesity (HCC)    Partial idiopathic epilepsy with seizures of localized onset, not intractable, with status epilepticus (HCC) 11/28/2015    PCP: Andree Coss   REFERRING PROVIDER: Sanjuan Dame DIAG: [phone] (ICD-10-CM) - Chronic pain of left knee M79.672 (ICD-10-CM) - Pain of left  heel  THERAPY DIAG:  Chronic pain of both knees  Heel pain, chronic, left  Difficulty in walking, not elsewhere classified  Rationale for Evaluation and Treatment: Rehabilitation  ONSET DATE: a few yrs 2019. Seizures   SUBJECTIVE:   SUBJECTIVE STATEMENT: No pain today. Pt asking questions about working with her trainer and taking classes as well.  She is excited for her aquatics.   Pt reports chronic knee pain, bilateral for several years.  She reports she began to have seizures several yes ago and that really set her back physically.  She was enrolled in the program [facility] and the [facility] and was working on nutrition, exercise and healthy lifestyle habit and she loves it.  She is limited at the gym with what she can do in her lower body and does not want to make it worse.  It started on the L knee  She has had to change her workout routine.  Pain is in both posterior knees. Her Rt knee gives out at times. She has difficulty walking, squatting, and esp.  lunging Pain in L heel is when she has a day of working out and after sitting. Pain in AM in L heel with the first steps she takes.  She has not had injections, no bracing or foot orthotics. She does have flat feet.  PERTINENT HISTORY: Seizures, knee OA  PAIN:  Are you having pain? Yes: NPRS scale: not really today  Pain location: posterior knees  Pain description: tightness , soreness  Aggravating factors: walking, steps , lunge, bending knee  Relieving factors: cream, ice pack, rolling her L foot out Wears [employer] to work out in, these are good.   Pain increases with barefoot.     PRECAUTIONS: None  RED FLAGS: None   WEIGHT BEARING RESTRICTIONS: No  FALLS:  Has patient fallen in last 6 months? No  LIVING ENVIRONMENT: Lives with: lives with their partner Lives in: House/apartment Stairs: Yes: Internal: 2 flights  steps; on right going up Has following equipment at home: None  OCCUPATION: not working   PLOF:  Independent  PATIENT GOALS: I want to be able to run someday.  Cont to workout. Yoga.   NEXT MD VISIT: See Dr. Roda Shutters in April   OBJECTIVE:  Note: Objective measures were completed at Evaluation unless otherwise noted.  DIAGNOSTIC FINDINGS: tricompartment osteoarthritis with mild  valgus alignment.   PATIENT SURVEYS:  LEFS 61%  COGNITION: Overall cognitive status: Within functional limits for tasks assessed     SENSATION: WFL  EDEMA:  Circumferential: NT   MUSCLE LENGTH: Hamstrings: Right 30 deg; Left 25 deg   POSTURE:  genu valgus, pes planus   PALPATION: Pain with palpation to posterior knee and medial Rt knee joint line   LOWER EXTREMITY ROM:  Active ROM Right eval Left eval  Hip flexion    Hip extension    Hip abduction    Hip adduction    Hip internal rotation    Hip external rotation    Knee flexion 120 110 pain   Knee extension -10 -8  Ankle dorsiflexion -5 -5  Ankle plantarflexion    Ankle inversion    Ankle eversion     (Blank rows = not tested)  LOWER EXTREMITY MMT:  MMT Right eval Left eval  Hip flexion 4 4  Hip extension NT NT   Hip abduction NT on eval NT on eval   Hip adduction    Hip internal rotation    Hip external rotation    Knee flexion 5 5  Knee extension 5 5  Ankle dorsiflexion 5 5  Ankle plantarflexion 4 4  Ankle inversion 4 4  Ankle eversion 4 4   (Blank rows = not tested)  LOWER EXTREMITY SPECIAL TESTS:  NT   FUNCTIONAL TESTS:  5 times sit to stand: 10.4 sec  2 minute walk test: NT   Single leg balance Rt 10 sec, L 20 sec   GAIT: Distance walked: 150+ Assistive device utilized: None Level of assistance: Complete Independence Comments: no deviations                                                                                                                                 TREATMENT DATE:   [facility] Adult PT Treatment:  DATE: 07/01/23 Therapeutic Activity: Recumbent  bike warm up L 2 for 5 min  Leg press 2 plate x 15, then 3 plates x 15  Knee extension 15 lbs x 15 x 2 sets Knee flexion 25 lbs x 15 x 2 set  Slant board  1 min Hamstring stretch /ITB stretch Bridge with GTB x 15  S/L Clam GTB x 15  Piriformis stretch  Self Care: Muscle hypertrophy Gym routine HEP    [facility] Adult PT Treatment:                                                DATE: 06/23/23 Self Care:   Pt educated on inserts for plantar fasciitis, knee ankle alignment  HEP TBA  Lunging , gym exercises Pool therapy    PATIENT EDUCATION:  Education details: see above  Person educated: Patient Education method: Programmer, multimedia, Facilities manager, and Verbal cues Education comprehension: verbalized understanding, returned demonstration, and needs further education  HOME EXERCISE PROGRAM: Did not issue, discussed gym routine   ASSESSMENT:  CLINICAL IMPRESSION: Patient was able to work on weight machines without increased pain.  She had appropriate muscle burn in quads afterwards. She is motivated to get stronger and get healthier.    Patient is a 48 y.o. female who was seen today for physical therapy evaluation and treatment for bilateral knee pain, L heel pain .   OBJECTIVE IMPAIRMENTS: decreased balance, decreased mobility, difficulty walking, decreased ROM, decreased strength, hypomobility, increased fascial restrictions, impaired flexibility, postural dysfunction, obesity, and pain.   ACTIVITY LIMITATIONS: standing, squatting, stairs, and locomotion level  PARTICIPATION LIMITATIONS: interpersonal relationship, community activity, and fitness  PERSONAL FACTORS: 1-2 comorbidities: seizures, knee OA   are also affecting patient's functional outcome.   REHAB POTENTIAL: Excellent  CLINICAL DECISION MAKING: Evolving/moderate complexity  EVALUATION COMPLEXITY: Moderate   GOALS: Goals reviewed with patient? Yes  SHORT TERM GOALS: Target date: 07/07/2023   Pt will be able to show I  with HEP initial  Baseline: unknown Goal status: INITIAL  2.  Pt will be able to modify her gym routine to accommodate knee pain  Baseline:  Goal status: INITIAL   LONG TERM GOALS: Target date: 08/18/2023    Pt will be able to show I with final HEP upon discharge  Baseline:  Goal status: INITIAL  2.  Pt will improve LEFS score by 10 points  Baseline: 49/80 Goal status: INITIAL  3.  Pt will be able to report only min heel pain with AM steps or sitting 30 min  Baseline: severe  Goal status: INITIAL  4.  Pt will be able to walk in the community as needed with knee pain < 2/10 Baseline:  Goal status: INITIAL  5.  Pt will perform single leg balance to calf raise x 10 in preparation for running  Baseline: 10-20 sec SLS  Goal status: INITIAL    PLAN:  PT FREQUENCY: 2x/week  PT DURATION: 6 weeks  PLANNED INTERVENTIONS: 97164- PT Re-evaluation, 97110-Therapeutic exercises, 97530- Therapeutic activity, 97112- Neuromuscular re-education, 97535- Self Care, 16109- Manual therapy, [phone]- Aquatic Therapy, Patient/Family education, Balance training, Taping, Dry Needling, Cryotherapy, and Moist heat  PLAN FOR NEXT SESSION: Check HEP, develop HEP and alternative ideas for gym ex for strength.  Aquatics week 2-3    Harmoni Lucus, PT 07/01/2023, 12:30 PM   Karie Mainland, PT 07/01/23 12:30 PM Phone:  [phone] Fax: [phone]

## 2023-07-01 ENCOUNTER — Encounter: Payer: Self-pay | Admitting: Physical Therapy

## 2023-07-01 ENCOUNTER — Ambulatory Visit: Payer: 59 | Attending: Orthopaedic Surgery | Admitting: Physical Therapy

## 2023-07-01 DIAGNOSIS — M25562 Pain in left knee: Secondary | ICD-10-CM | POA: Diagnosis not present

## 2023-07-01 DIAGNOSIS — M79672 Pain in left foot: Secondary | ICD-10-CM | POA: Diagnosis not present

## 2023-07-01 DIAGNOSIS — M25561 Pain in right knee: Secondary | ICD-10-CM | POA: Insufficient documentation

## 2023-07-01 DIAGNOSIS — G8929 Other chronic pain: Secondary | ICD-10-CM | POA: Diagnosis not present

## 2023-07-01 DIAGNOSIS — R262 Difficulty in walking, not elsewhere classified: Secondary | ICD-10-CM | POA: Diagnosis not present

## 2023-07-02 NOTE — Therapy (Unsigned)
 OUTPATIENT PHYSICAL THERAPY LOWER EXTREMITY NOTE   Patient Name: Kristina Coleman MRN: [id number] DOB:[birth year], 48 y.o., female Today's Date: 07/03/2023  END OF SESSION:  PT End of Session - 07/03/23 1106     Visit Number 3    Number of Visits 12    Date for PT Re-Evaluation 08/04/23    Authorization Type U[employer] MCR and MCD    PT Start Time 1102    PT Stop Time 1150    PT Time Calculation (min) 48 min    Activity Tolerance Patient tolerated treatment well    Behavior During Therapy WFL for tasks assessed/performed               Past Medical History:  Diagnosis Date   Anxiety    Arthritis    History of chicken pox    Idiopathic intracranial hypertension 12/27/2015   Lumbar Puncture: opening pressure 26cm of water. 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 Sherlean Foot ([facility])   Intracranial hypertension    Morbid obesity (HCC)    Recurrent seizures (HCC) 05/02/2018   Seizures (HCC) 11/2015   Tobacco use 12/24/2016   Past Surgical History:  Procedure Laterality Date   BREAST BIOPSY     CHOLECYSTECTOMY     left knee surgery     LUMBAR PUNCTURE     fluid removal in brain.    wisdom teeth removal     Patient Active Problem List   Diagnosis Date Noted   High risk medication use 06/04/2021   Elevated blood pressure reading 04/03/2021   Vitamin D deficiency 04/03/2021   Chronic pain of both knees 02/27/2021   Primary osteoarthritis of both knees 02/27/2021   Adjustment disorder with mixed anxiety and depressed mood 07/09/2017   Tobacco use 12/24/2016   Morbid obesity (HCC)    Partial idiopathic epilepsy with seizures of localized onset, not intractable, with status epilepticus (HCC) 11/28/2015    PCP: Andree Coss   REFERRING PROVIDER: Sanjuan Dame DIAG: [phone] (ICD-10-CM) - Chronic pain of left knee M79.672 (ICD-10-CM) - Pain of left  heel  THERAPY DIAG:  Chronic pain of both knees  Heel pain, chronic, left  Difficulty in walking, not elsewhere classified  Rationale for Evaluation and Treatment: Rehabilitation  ONSET DATE: a few yrs 2019. Seizures   SUBJECTIVE:   SUBJECTIVE STATEMENT: L knee a little painful, not bad.  No heel pain right now.   Pt reports chronic knee pain, bilateral for several years.  She reports she began to have seizures several yes ago and that really set her back physically.  She was enrolled in the program between [facility] and the [facility] and was working on nutrition, exercise and healthy lifestyle habit and she loves it.  She is limited at the gym with what she can do in her lower body and does not want to make it worse.  It started on the L knee  She has had to change her workout routine.  Pain is in both posterior knees. Her Rt knee gives out at times. She has difficulty walking, squatting, and esp.  lunging Pain in L heel is when she has a day of working out and after sitting. Pain in AM in L heel with the first steps she takes.  She has not had injections, no bracing or foot orthotics. She does have flat feet.   PERTINENT HISTORY: Seizures, knee OA  PAIN:  Are you having pain? Yes: NPRS scale: not really today  Pain location: posterior knees  Pain description: tightness , soreness  Aggravating factors: walking, steps , lunge, bending knee  Relieving factors: cream, ice pack, rolling her L foot out Wears [employer] to work out in, these are good.   Pain increases with barefoot.     PRECAUTIONS: None  RED FLAGS: None   WEIGHT BEARING RESTRICTIONS: No  FALLS:  Has patient fallen in last 6 months? No  LIVING ENVIRONMENT: Lives with: lives with their partner Lives in: House/apartment Stairs: Yes: Internal: 2 flights  steps; on right going up Has following equipment at home: None  OCCUPATION: not working   PLOF: Independent  PATIENT GOALS: I want to be able to run someday.  Cont to  workout. Yoga.   NEXT MD VISIT: See Dr. Roda Shutters in April   OBJECTIVE:  Note: Objective measures were completed at Evaluation unless otherwise noted.  DIAGNOSTIC FINDINGS: tricompartment osteoarthritis with mild  valgus alignment.   PATIENT SURVEYS:  LEFS 61%  COGNITION: Overall cognitive status: Within functional limits for tasks assessed     SENSATION: WFL  EDEMA:  Circumferential: NT   MUSCLE LENGTH: Hamstrings: Right 30 deg; Left 25 deg   POSTURE:  genu valgus, pes planus   PALPATION: Pain with palpation to posterior knee and medial Rt knee joint line   LOWER EXTREMITY ROM:  Active ROM Right eval Left eval  Hip flexion    Hip extension    Hip abduction    Hip adduction    Hip internal rotation    Hip external rotation    Knee flexion 120 110 pain   Knee extension -10 -8  Ankle dorsiflexion -5 -5  Ankle plantarflexion    Ankle inversion    Ankle eversion     (Blank rows = not tested)  LOWER EXTREMITY MMT:  MMT Right eval Left eval  Hip flexion 4 4  Hip extension NT NT   Hip abduction NT on eval NT on eval   Hip adduction    Hip internal rotation    Hip external rotation    Knee flexion 5 5  Knee extension 5 5  Ankle dorsiflexion 5 5  Ankle plantarflexion 4 4  Ankle inversion 4 4  Ankle eversion 4 4   (Blank rows = not tested)  LOWER EXTREMITY SPECIAL TESTS:  NT   FUNCTIONAL TESTS:  5 times sit to stand: 10.4 sec  2 minute walk test: NT   Single leg balance Rt 10 sec, L 20 sec   GAIT: Distance walked: 150+ Assistive device utilized: None Level of assistance: Complete Independence Comments: no deviations                                                                                                                                 TREATMENT DATE:   [facility] Adult PT Treatment:  DATE: 07/03/23 Therapeutic Activity: Supine post pelvic tilt/ab bracing x 10  Post tilt to bridge x 10  Elliptical 5  min L1  ramp 1  Supine hamstring and ITB stretch  SLR 2 x 10, 3 lbs toes up SLR with VMO x 10 , 3 lbs  Hip abduction x 10 x 3 lbs  No wgt hip abduction sweeps  x 10   Piriformis stretch  Airex for simple balance exercises : narrow, tandem, EC, head turns Airex tandem hold Airex semi-tandem with alt. Arm Airex semi tandem with single arm horizontal abd   [facility] Adult PT Treatment:                                                DATE: 07/01/23 Therapeutic Activity: Recumbent bike warm up L 2 for 5 min  Leg press 2 plate x 15, then 3 plates x 15  Knee extension 15 lbs x 15 x 2 sets Knee flexion 25 lbs x 15 x 2 set  Slant board  1 min Hamstring stretch /ITB stretch Bridge with GTB x 15  S/L Clam GTB x 15  Piriformis stretch  Self Care: Muscle hypertrophy Gym routine HEP    [facility] Adult PT Treatment:                                                DATE: 06/23/23 Self Care:   Pt educated on inserts for plantar fasciitis, knee ankle alignment  HEP TBA  Lunging , gym exercises Pool therapy    PATIENT EDUCATION:  Education details: see above  Person educated: Patient Education method: Programmer, multimedia, Facilities manager, and Verbal cues Education comprehension: verbalized understanding, returned demonstration, and needs further education  HOME EXERCISE PROGRAM: Access Code: [email or web address] URL: [email or web address] Date: 07/03/2023 Prepared by: Karie Mainland  Exercises - Supine Bridge with Resistance Band  - 1 x daily - 7 x weekly - 2 sets - 10 reps - 5 hold - Clamshell with Resistance  - 1 x daily - 7 x weekly - 2 sets - 10 reps - 5 hold - Supine Hamstring Stretch with Strap  - 1 x daily - 7 x weekly - 1 sets - 3 reps - 30 hold  ASSESSMENT:  CLINICAL IMPRESSION: Patient with very min pain in L knee today. Focused on hip strengthening and ankle stability, proprioception on balance pad. She was able to complete the session without pain and felt appropriately fatigued end of  session.  She may consider using the elliptical at the [facility] to reduce impact on her knees.  Cont POC. Looking forward to the pool next week.   Patient is a 48 y.o. female who was seen today for physical therapy evaluation and treatment for bilateral knee pain, L heel pain .   OBJECTIVE IMPAIRMENTS: decreased balance, decreased mobility, difficulty walking, decreased ROM, decreased strength, hypomobility, increased fascial restrictions, impaired flexibility, postural dysfunction, obesity, and pain.   ACTIVITY LIMITATIONS: standing, squatting, stairs, and locomotion level  PARTICIPATION LIMITATIONS: interpersonal relationship, community activity, and fitness  PERSONAL FACTORS: 1-2 comorbidities: seizures, knee OA   are also affecting patient's functional outcome.   REHAB POTENTIAL: Excellent  CLINICAL DECISION MAKING: Evolving/moderate complexity  EVALUATION COMPLEXITY: Moderate   GOALS: Goals reviewed  with patient? Yes  SHORT TERM GOALS: Target date: 07/07/2023   Pt will be able to show I with HEP initial  Baseline: unknown Goal status: met   2.  Pt will be able to modify her gym routine to accommodate knee pain  Baseline:  Goal status: met    LONG TERM GOALS: Target date: 08/18/2023    Pt will be able to show I with final HEP upon discharge  Baseline:  Goal status: INITIAL  2.  Pt will improve LEFS score by 10 points  Baseline: 49/80 Goal status: INITIAL  3.  Pt will be able to report only min heel pain with AM steps or sitting 30 min  Baseline: severe  Goal status: INITIAL  4.  Pt will be able to walk in the community as needed with knee pain < 2/10 Baseline:  Goal status: INITIAL  5.  Pt will perform single leg balance to calf raise x 10 in preparation for running  Baseline: 10-20 sec SLS  Goal status: INITIAL    PLAN:  PT FREQUENCY: 2x/week  PT DURATION: 6 weeks  PLANNED INTERVENTIONS: 97164- PT Re-evaluation, 97110-Therapeutic exercises, 97530-  Therapeutic activity, 97112- Neuromuscular re-education, 97535- Self Care, 16109- Manual therapy, [phone]- Aquatic Therapy, Patient/Family education, Balance training, Taping, Dry Needling, Cryotherapy, and Moist heat  PLAN FOR NEXT SESSION: Check HEP, develop HEP and alternative ideas for gym ex for strength.  Aquatics week 2-3    Derriona Branscom, PT 07/03/2023, 12:01 PM   Karie Mainland, PT 07/03/23 12:01 PM Phone: [phone] Fax: [phone]

## 2023-07-03 ENCOUNTER — Encounter: Payer: Self-pay | Admitting: Orthopaedic Surgery

## 2023-07-03 ENCOUNTER — Ambulatory Visit: Payer: 59 | Admitting: Physical Therapy

## 2023-07-03 ENCOUNTER — Encounter: Payer: Self-pay | Admitting: Physical Therapy

## 2023-07-03 DIAGNOSIS — R262 Difficulty in walking, not elsewhere classified: Secondary | ICD-10-CM | POA: Diagnosis not present

## 2023-07-03 DIAGNOSIS — G8929 Other chronic pain: Secondary | ICD-10-CM

## 2023-07-03 DIAGNOSIS — M25562 Pain in left knee: Secondary | ICD-10-CM | POA: Diagnosis not present

## 2023-07-03 DIAGNOSIS — M79672 Pain in left foot: Secondary | ICD-10-CM | POA: Diagnosis not present

## 2023-07-03 DIAGNOSIS — M25561 Pain in right knee: Secondary | ICD-10-CM | POA: Diagnosis not present

## 2023-07-08 ENCOUNTER — Encounter: Payer: Self-pay | Admitting: Physical Therapy

## 2023-07-08 ENCOUNTER — Ambulatory Visit: Payer: 59 | Admitting: Physical Therapy

## 2023-07-08 DIAGNOSIS — G8929 Other chronic pain: Secondary | ICD-10-CM | POA: Diagnosis not present

## 2023-07-08 DIAGNOSIS — M25561 Pain in right knee: Secondary | ICD-10-CM | POA: Diagnosis not present

## 2023-07-08 DIAGNOSIS — M25562 Pain in left knee: Secondary | ICD-10-CM | POA: Diagnosis not present

## 2023-07-08 DIAGNOSIS — R262 Difficulty in walking, not elsewhere classified: Secondary | ICD-10-CM

## 2023-07-08 DIAGNOSIS — M79672 Pain in left foot: Secondary | ICD-10-CM | POA: Diagnosis not present

## 2023-07-08 NOTE — Therapy (Signed)
 OUTPATIENT PHYSICAL THERAPY LOWER EXTREMITY NOTE   Patient Name: Kristina Coleman MRN: [id number] DOB:03/11/1976, 48 y.o., female Today's Date: 07/08/2023  END OF SESSION:  PT End of Session - 07/08/23 1110     Visit Number 4    Number of Visits 12    Date for PT Re-Evaluation 08/04/23    Authorization Type UHC MCR and MCD    PT Start Time 1100    PT Stop Time 1155    PT Time Calculation (min) 55 min    Activity Tolerance Patient tolerated treatment well    Behavior During Therapy WFL for tasks assessed/performed                Past Medical History:  Diagnosis Date   Anxiety    Arthritis    History of chicken pox    Idiopathic intracranial hypertension 12/27/2015   Lumbar Puncture: opening pressure 26cm of water. 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 Sherlean Foot ([facility])   Intracranial hypertension    Morbid obesity (HCC)    Recurrent seizures (HCC) 05/02/2018   Seizures (HCC) 11/2015   Tobacco use 12/24/2016   Past Surgical History:  Procedure Laterality Date   BREAST BIOPSY     CHOLECYSTECTOMY     left knee surgery     LUMBAR PUNCTURE     fluid removal in brain.    wisdom teeth removal     Patient Active Problem List   Diagnosis Date Noted   High risk medication use 06/04/2021   Elevated blood pressure reading 04/03/2021   Vitamin D deficiency 04/03/2021   Chronic pain of both knees 02/27/2021   Primary osteoarthritis of both knees 02/27/2021   Adjustment disorder with mixed anxiety and depressed mood 07/09/2017   Tobacco use 12/24/2016   Morbid obesity (HCC)    Partial idiopathic epilepsy with seizures of localized onset, not intractable, with status epilepticus (HCC) 11/28/2015    PCP: Andree Coss   REFERRING PROVIDER: Sanjuan Dame DIAG: [phone] (ICD-10-CM) - Chronic pain of left knee M79.672 (ICD-10-CM) - Pain of left  heel  THERAPY DIAG:  Chronic pain of both knees  Heel pain, chronic, left  Difficulty in walking, not elsewhere classified  Rationale for Evaluation and Treatment: Rehabilitation  ONSET DATE: a few yrs 2019. Seizures   SUBJECTIVE:   SUBJECTIVE STATEMENT: I did a class yesterday and tweaked something in my Rt knee.  Points to medial joint line, 5/10.    Pt reports chronic knee pain, bilateral for several years.  She reports she began to have seizures several yes ago and that really set her back physically.  She was enrolled in the program between [facility] and the [facility] and was working on nutrition, exercise and healthy lifestyle habit and she loves it.  She is limited at the gym with what she can do in her lower body and does not want to make it worse.  It started on the L knee  She has had to change her workout routine.  Pain is in both posterior knees. Her Rt knee gives out at times. She has difficulty walking, squatting, and esp.  lunging Pain in L heel is when she has a day of working out and after sitting. Pain in AM in L heel with the first steps she takes.  She has not had injections, no bracing or foot orthotics. She does have flat feet.  PERTINENT HISTORY: Seizures, knee OA  PAIN:  Are you having pain? Yes: NPRS scale: 5/10 Pain location: Rt medial knee   Pain description: tightness , soreness  Aggravating factors: walking, steps , lunge, bending knee  Relieving factors: cream, ice pack, rolling her L foot out Wears [employer] to work out in, these are good.   Pain increases with barefoot.     PRECAUTIONS: None  RED FLAGS: None   WEIGHT BEARING RESTRICTIONS: No  FALLS:  Has patient fallen in last 6 months? No  LIVING ENVIRONMENT: Lives with: lives with their partner Lives in: House/apartment Stairs: Yes: Internal: 2 flights  steps; on right going up Has following equipment at home: None  OCCUPATION: not working   PLOF: Independent  PATIENT GOALS: I want to be able  to run someday.  Cont to workout. Yoga.   NEXT MD VISIT: See Dr. Roda Shutters in April   OBJECTIVE:  Note: Objective measures were completed at Evaluation unless otherwise noted.  DIAGNOSTIC FINDINGS: tricompartment osteoarthritis with mild  valgus alignment.   PATIENT SURVEYS:  LEFS 61%  COGNITION: Overall cognitive status: Within functional limits for tasks assessed     SENSATION: WFL  EDEMA:  Circumferential: NT   MUSCLE LENGTH: Hamstrings: Right 30 deg; Left 25 deg   POSTURE:  genu valgus, pes planus   PALPATION: Pain with palpation to posterior knee and medial Rt knee joint line   LOWER EXTREMITY ROM:  Active ROM Right eval Left eval  Hip flexion    Hip extension    Hip abduction    Hip adduction    Hip internal rotation    Hip external rotation    Knee flexion 120 110 pain   Knee extension -10 -8  Ankle dorsiflexion -5 -5  Ankle plantarflexion    Ankle inversion    Ankle eversion     (Blank rows = not tested)  LOWER EXTREMITY MMT:  MMT Right eval Left eval  Hip flexion 4 4  Hip extension NT NT   Hip abduction NT on eval NT on eval   Hip adduction    Hip internal rotation    Hip external rotation    Knee flexion 5 5  Knee extension 5 5  Ankle dorsiflexion 5 5  Ankle plantarflexion 4 4  Ankle inversion 4 4  Ankle eversion 4 4   (Blank rows = not tested)  LOWER EXTREMITY SPECIAL TESTS:  NT   FUNCTIONAL TESTS:  5 times sit to stand: 10.4 sec  2 minute walk test: NT   Single leg balance Rt 10 sec, L 20 sec   GAIT: Distance walked: 150+ Assistive device utilized: None Level of assistance: Complete Independence Comments: no deviations                                                                                                                                 TREATMENT DATE:   [facility] Adult PT Treatment:  DATE: 07/08/23 Therapeutic Exercise: Recumbent bike L 2 for 5 min Manual Therapy: Soft  tissue work Rt quads focus on medial joint , patellar mobs Therapeutic Activity: Supine 3 way hip stretch  Quad set Rt  SLR toes out x 15 Hamstring bridge  SLR hip abduction  Wall sit (pin in knees) Step up 4 inch to SLR Reverse step up (glute) x 10 each side  Modalities: Cold pack 10 min Rt knee   [facility] Adult PT Treatment:                                                DATE: 07/03/23 Therapeutic Activity: Supine post pelvic tilt/ab bracing x 10  Post tilt to bridge x 10  Elliptical 5 min L1  ramp 1  Supine hamstring and ITB stretch  SLR 2 x 10, 3 lbs toes up SLR with VMO x 10 , 3 lbs  Hip abduction x 10 x 3 lbs  No wgt hip abduction sweeps  x 10   Piriformis stretch  Airex for simple balance exercises : narrow, tandem, EC, head turns Airex tandem hold Airex semi-tandem with alt. Arm Airex semi tandem with single arm horizontal abd   [facility] Adult PT Treatment:                                                DATE: 07/01/23 Therapeutic Activity: Recumbent bike warm up L 2 for 5 min  Leg press 2 plate x 15, then 3 plates x 15  Knee extension 15 lbs x 15 x 2 sets Knee flexion 25 lbs x 15 x 2 set  Slant board  1 min Hamstring stretch /ITB stretch Bridge with GTB x 15  S/L Clam GTB x 15  Piriformis stretch  Self Care: Muscle hypertrophy Gym routine HEP    [facility] Adult PT Treatment:                                                DATE: 06/23/23 Self Care:   Pt educated on inserts for plantar fasciitis, knee ankle alignment  HEP TBA  Lunging , gym exercises Pool therapy    PATIENT EDUCATION:  Education details: see above  Person educated: Patient Education method: Programmer, multimedia, Facilities manager, and Verbal cues Education comprehension: verbalized understanding, returned demonstration, and needs further education  HOME EXERCISE PROGRAM: Access Code: [email or web address] URL: [email or web address] Date: 07/03/2023 Prepared by: Karie Mainland  Exercises - Supine Bridge with  Resistance Band  - 1 x daily - 7 x weekly - 2 sets - 10 reps - 5 hold - Clamshell with Resistance  - 1 x daily - 7 x weekly - 2 sets - 10 reps - 5 hold - Supine Hamstring Stretch with Strap  - 1 x daily - 7 x weekly - 1 sets - 3 reps - 30 hold  ASSESSMENT:  CLINICAL IMPRESSION: Patient with a flare up of her Rt knee after a yoga class, she was doing a bit of weightbearing on her knee.  Wall sits increased pain in both knees.  Session focused on closed chain strengthening for  knee and hips. Encouraged icing after exercises if pain continues. She is motivated and attending the gym consistently, looking forward to the pool this week.    Patient is a 48 y.o. female who was seen today for physical therapy evaluation and treatment for bilateral knee pain, L heel pain .   OBJECTIVE IMPAIRMENTS: decreased balance, decreased mobility, difficulty walking, decreased ROM, decreased strength, hypomobility, increased fascial restrictions, impaired flexibility, postural dysfunction, obesity, and pain.   ACTIVITY LIMITATIONS: standing, squatting, stairs, and locomotion level  PARTICIPATION LIMITATIONS: interpersonal relationship, community activity, and fitness  PERSONAL FACTORS: 1-2 comorbidities: seizures, knee OA   are also affecting patient's functional outcome.   REHAB POTENTIAL: Excellent  CLINICAL DECISION MAKING: Evolving/moderate complexity  EVALUATION COMPLEXITY: Moderate   GOALS: Goals reviewed with patient? Yes  SHORT TERM GOALS: Target date: 07/07/2023   Pt will be able to show I with HEP initial  Baseline: unknown Goal status: met   2.  Pt will be able to modify her gym routine to accommodate knee pain  Baseline:  Goal status: met    LONG TERM GOALS: Target date: 08/18/2023    Pt will be able to show I with final HEP upon discharge  Baseline:  Goal status: INITIAL  2.  Pt will improve LEFS score by 10 points  Baseline: 49/80 Goal status: INITIAL  3.  Pt will be able to  report only min heel pain with AM steps or sitting 30 min  Baseline: severe  Goal status: INITIAL  4.  Pt will be able to walk in the community as needed with knee pain < 2/10 Baseline:  Goal status: INITIAL  5.  Pt will perform single leg balance to calf raise x 10 in preparation for running  Baseline: 10-20 sec SLS  Goal status: INITIAL    PLAN:  PT FREQUENCY: 2x/week  PT DURATION: 6 weeks  PLANNED INTERVENTIONS: 97164- PT Re-evaluation, 97110-Therapeutic exercises, 97530- Therapeutic activity, 97112- Neuromuscular re-education, 97535- Self Care, 16109- Manual therapy, [phone]- Aquatic Therapy, Patient/Family education, Balance training, Taping, Dry Needling, Cryotherapy, and Moist heat  PLAN FOR NEXT SESSION: Check HEP, develop HEP and alternative ideas for gym ex for strength.  Aquatics week 2-3    Forest Pruden, PT 07/08/2023, 1:27 PM   Karie Mainland, PT 07/08/23 1:27 PM Phone: [phone] Fax: [phone]

## 2023-07-11 ENCOUNTER — Ambulatory Visit: Payer: 59

## 2023-07-11 DIAGNOSIS — M25561 Pain in right knee: Secondary | ICD-10-CM | POA: Diagnosis not present

## 2023-07-11 DIAGNOSIS — M25562 Pain in left knee: Secondary | ICD-10-CM

## 2023-07-11 DIAGNOSIS — R262 Difficulty in walking, not elsewhere classified: Secondary | ICD-10-CM | POA: Diagnosis not present

## 2023-07-11 DIAGNOSIS — M79672 Pain in left foot: Secondary | ICD-10-CM | POA: Diagnosis not present

## 2023-07-11 DIAGNOSIS — G8929 Other chronic pain: Secondary | ICD-10-CM

## 2023-07-11 NOTE — Therapy (Signed)
 OUTPATIENT PHYSICAL THERAPY LOWER EXTREMITY NOTE   Patient Name: Kristina Coleman MRN: [id number] DOB:Jun 30, 1975, 48 y.o., female Today's Date: 07/11/2023  END OF SESSION:  PT End of Session - 07/11/23 1557     Visit Number 5    Number of Visits 12    Date for PT Re-Evaluation 08/04/23    Authorization Type UHC MCR and MCD    PT Start Time 1600    PT Stop Time 1640    PT Time Calculation (min) 40 min    Activity Tolerance Patient tolerated treatment well    Behavior During Therapy WFL for tasks assessed/performed             Past Medical History:  Diagnosis Date   Anxiety    Arthritis    History of chicken pox    Idiopathic intracranial hypertension 12/27/2015   Lumbar Puncture: opening pressure 26cm of water. 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 Sherlean Foot ([facility])   Intracranial hypertension    Morbid obesity (HCC)    Recurrent seizures (HCC) 05/02/2018   Seizures (HCC) 11/2015   Tobacco use 12/24/2016   Past Surgical History:  Procedure Laterality Date   BREAST BIOPSY     CHOLECYSTECTOMY     left knee surgery     LUMBAR PUNCTURE     fluid removal in brain.    wisdom teeth removal     Patient Active Problem List   Diagnosis Date Noted   High risk medication use 06/04/2021   Elevated blood pressure reading 04/03/2021   Vitamin D deficiency 04/03/2021   Chronic pain of both knees 02/27/2021   Primary osteoarthritis of both knees 02/27/2021   Adjustment disorder with mixed anxiety and depressed mood 07/09/2017   Tobacco use 12/24/2016   Morbid obesity (HCC)    Partial idiopathic epilepsy with seizures of localized onset, not intractable, with status epilepticus (HCC) 11/28/2015    PCP: Andree Coss   REFERRING PROVIDER: Sanjuan Dame DIAG: [phone] (ICD-10-CM) - Chronic pain of left knee M79.672 (ICD-10-CM) - Pain of left  heel  THERAPY DIAG:  Chronic pain of both knees  Heel pain, chronic, left  Difficulty in walking, not elsewhere classified  Rationale for Evaluation and Treatment: Rehabilitation  ONSET DATE: a few yrs 2019. Seizures   SUBJECTIVE:   SUBJECTIVE STATEMENT: Patient reports that her knees feel ok today.  EVAL: Pt reports chronic knee pain, bilateral for several years.  She reports she began to have seizures several yes ago and that really set her back physically.  She was enrolled in the program [facility] and the [facility] and was working on nutrition, exercise and healthy lifestyle habit and she loves it.  She is limited at the gym with what she can do in her lower body and does not want to make it worse.  It started on the L knee  She has had to change her workout routine.  Pain is in both posterior knees. Her Rt knee gives out at times. She has difficulty walking, squatting, and esp.  lunging Pain in L heel is when she has a day of working out and after sitting. Pain in AM in L heel with the first steps she takes.  She has not had injections, no bracing or foot orthotics. She does have flat feet.   PERTINENT HISTORY: Seizures, knee OA  PAIN:  Are you having pain? Yes: NPRS scale:  5/10 Pain location: Rt medial knee   Pain description: tightness , soreness  Aggravating factors: walking, steps , lunge, bending knee  Relieving factors: cream, ice pack, rolling her L foot out Wears [employer] to work out in, these are good.   Pain increases with barefoot.     PRECAUTIONS: None  RED FLAGS: None   WEIGHT BEARING RESTRICTIONS: No  FALLS:  Has patient fallen in last 6 months? No  LIVING ENVIRONMENT: Lives with: lives with their partner Lives in: House/apartment Stairs: Yes: Internal: 2 flights  steps; on right going up Has following equipment at home: None  OCCUPATION: not working   PLOF: Independent  PATIENT GOALS: I want to be able to run someday.  Cont to workout. Yoga.    NEXT MD VISIT: See Dr. Roda Shutters in April   OBJECTIVE:  Note: Objective measures were completed at Evaluation unless otherwise noted.  DIAGNOSTIC FINDINGS: tricompartment osteoarthritis with mild  valgus alignment.   PATIENT SURVEYS:  LEFS 61%  COGNITION: Overall cognitive status: Within functional limits for tasks assessed     SENSATION: WFL  EDEMA:  Circumferential: NT   MUSCLE LENGTH: Hamstrings: Right 30 deg; Left 25 deg   POSTURE:  genu valgus, pes planus   PALPATION: Pain with palpation to posterior knee and medial Rt knee joint line   LOWER EXTREMITY ROM:  Active ROM Right eval Left eval  Hip flexion    Hip extension    Hip abduction    Hip adduction    Hip internal rotation    Hip external rotation    Knee flexion 120 110 pain   Knee extension -10 -8  Ankle dorsiflexion -5 -5  Ankle plantarflexion    Ankle inversion    Ankle eversion     (Blank rows = not tested)  LOWER EXTREMITY MMT:  MMT Right eval Left eval  Hip flexion 4 4  Hip extension NT NT   Hip abduction NT on eval NT on eval   Hip adduction    Hip internal rotation    Hip external rotation    Knee flexion 5 5  Knee extension 5 5  Ankle dorsiflexion 5 5  Ankle plantarflexion 4 4  Ankle inversion 4 4  Ankle eversion 4 4   (Blank rows = not tested)  LOWER EXTREMITY SPECIAL TESTS:  NT   FUNCTIONAL TESTS:  5 times sit to stand: 10.4 sec  2 minute walk test: NT   Single leg balance Rt 10 sec, L 20 sec   GAIT: Distance walked: 150+ Assistive device utilized: None Level of assistance: Complete Independence Comments: no deviations                                                                                                                                 TREATMENT DATE:  [facility] Adult PT Treatment:  DATE: 07/11/23 Aquatic therapy at [facility]- [address] - therapeutic pool temp approximately 90 degrees. Pt enters building  ambulating independently. Treatment took place in water 3.8 to  4 ft 8 in. deep depending upon activity.  Pt entered and exited the pool via stair and handrails independently. Patient entered water for aquatic therapy for first time and was introduced to principles and therapeutic effects of water as they ambulated and acclimated to pool.  Therapeutic Exercise: Aquatic Exercise: Walking forward/backwards/side stepping Lunge stepping forwards x2 laps Side stepping with rainbow DB shoulder abd/add x2 laps Warrior I pool noodle lift x10 BIL Standing with UE support edge of pool: Hip abd/add x20 BIL Squats 2x20 Heel/toe raises 2x20 Calf stretch 2x30" BIL Hip ext/flex with knee straight x 20 BIL Hip Circles CC/CCW x10 each BIL   Pt requires the buoyancy of water for active assisted exercises with buoyancy supported for strengthening and AROM exercises. Hydrostatic pressure also supports joints by unweighting joint load by at least 50 % in 3-4 feet depth water. 80% in chest to neck deep water. Water will provide assistance with movement using the current and laminar flow while the buoyancy reduces weight bearing. Pt requires the viscosity of the water for resistance with strengthening exercises.  [facility] Adult PT Treatment:                                                DATE: 07/08/23 Therapeutic Exercise: Recumbent bike L 2 for 5 min Manual Therapy: Soft tissue work Rt quads focus on medial joint , patellar mobs Therapeutic Activity: Supine 3 way hip stretch  Quad set Rt  SLR toes out x 15 Hamstring bridge  SLR hip abduction  Wall sit (pin in knees) Step up 4 inch to SLR Reverse step up (glute) x 10 each side  Modalities: Cold pack 10 min Rt knee   [facility] Adult PT Treatment:                                                DATE: 07/03/23 Therapeutic Activity: Supine post pelvic tilt/ab bracing x 10  Post tilt to bridge x 10  Elliptical 5 min L1  ramp 1  Supine hamstring and ITB stretch   SLR 2 x 10, 3 lbs toes up SLR with VMO x 10 , 3 lbs  Hip abduction x 10 x 3 lbs  No wgt hip abduction sweeps  x 10   Piriformis stretch  Airex for simple balance exercises : narrow, tandem, EC, head turns Airex tandem hold Airex semi-tandem with alt. Arm Airex semi tandem with single arm horizontal abd    PATIENT EDUCATION:  Education details: see above  Person educated: Patient Education method: Explanation, Demonstration, and Verbal cues Education comprehension: verbalized understanding, returned demonstration, and needs further education  HOME EXERCISE PROGRAM: Access Code: [email or web address] URL: [email or web address] Date: 07/03/2023 Prepared by: Karie Mainland  Exercises - Supine Bridge with Resistance Band  - 1 x daily - 7 x weekly - 2 sets - 10 reps - 5 hold - Clamshell with Resistance  - 1 x daily - 7 x weekly - 2 sets - 10 reps - 5 hold - Supine Hamstring Stretch with Strap  - 1 x daily -  7 x weekly - 1 sets - 3 reps - 30 hold  ASSESSMENT:  CLINICAL IMPRESSION: Patient presents to first aquatic PT session reporting that her knees are feeling ok today. Session today focused on general LE strengthening, stretching for calf muscles, and balance tasks in the aquatic environment for use of buoyancy to offload joints and the viscosity of water as resistance during therapeutic exercise. Patient was able to tolerate all prescribed exercises in the aquatic environment with no adverse effects. Patient continues to benefit from skilled PT services on land and aquatic based and should be progressed as able to improve functional independence.   EVAL: Patient is a 48 y.o. female who was seen today for physical therapy evaluation and treatment for bilateral knee pain, L heel pain .   OBJECTIVE IMPAIRMENTS: decreased balance, decreased mobility, difficulty walking, decreased ROM, decreased strength, hypomobility, increased fascial restrictions, impaired flexibility, postural  dysfunction, obesity, and pain.   ACTIVITY LIMITATIONS: standing, squatting, stairs, and locomotion level  PARTICIPATION LIMITATIONS: interpersonal relationship, community activity, and fitness  PERSONAL FACTORS: 1-2 comorbidities: seizures, knee OA   are also affecting patient's functional outcome.   REHAB POTENTIAL: Excellent  CLINICAL DECISION MAKING: Evolving/moderate complexity  EVALUATION COMPLEXITY: Moderate   GOALS: Goals reviewed with patient? Yes  SHORT TERM GOALS: Target date: 07/07/2023   Pt will be able to show I with HEP initial  Baseline: unknown Goal status: met   2.  Pt will be able to modify her gym routine to accommodate knee pain  Baseline:  Goal status: met    LONG TERM GOALS: Target date: 08/18/2023    Pt will be able to show I with final HEP upon discharge  Baseline:  Goal status: INITIAL  2.  Pt will improve LEFS score by 10 points  Baseline: 49/80 Goal status: INITIAL  3.  Pt will be able to report only min heel pain with AM steps or sitting 30 min  Baseline: severe  Goal status: INITIAL  4.  Pt will be able to walk in the community as needed with knee pain < 2/10 Baseline:  Goal status: INITIAL  5.  Pt will perform single leg balance to calf raise x 10 in preparation for running  Baseline: 10-20 sec SLS  Goal status: INITIAL    PLAN:  PT FREQUENCY: 2x/week  PT DURATION: 6 weeks  PLANNED INTERVENTIONS: 97164- PT Re-evaluation, 97110-Therapeutic exercises, 97530- Therapeutic activity, 97112- Neuromuscular re-education, 97535- Self Care, 16109- Manual therapy, [phone]- Aquatic Therapy, Patient/Family education, Balance training, Taping, Dry Needling, Cryotherapy, and Moist heat  PLAN FOR NEXT SESSION: Check HEP, develop HEP and alternative ideas for gym ex for strength.  Aquatics week 2-3    Berta Minor, PTA 07/11/2023, 4:44 PM

## 2023-07-14 NOTE — Therapy (Unsigned)
 OUTPATIENT PHYSICAL THERAPY LOWER EXTREMITY NOTE   Patient Name: Kristina Coleman MRN: [id number] DOB:Sep 01, 1975, 48 y.o., female Today's Date: 07/15/2023  END OF SESSION:  PT End of Session - 07/15/23 1105     Visit Number 6    Number of Visits 12    Date for PT Re-Evaluation 08/04/23    Authorization Type UHC MCR and MCD    PT Start Time 1103    PT Stop Time 1145    PT Time Calculation (min) 42 min    Activity Tolerance Patient tolerated treatment well    Behavior During Therapy WFL for tasks assessed/performed              Past Medical History:  Diagnosis Date   Anxiety    Arthritis    History of chicken pox    Idiopathic intracranial hypertension 12/27/2015   Lumbar Puncture: opening pressure 26cm of water. 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 Sherlean Foot ([facility])   Intracranial hypertension    Morbid obesity (HCC)    Recurrent seizures (HCC) 05/02/2018   Seizures (HCC) 11/2015   Tobacco use 12/24/2016   Past Surgical History:  Procedure Laterality Date   BREAST BIOPSY     CHOLECYSTECTOMY     left knee surgery     LUMBAR PUNCTURE     fluid removal in brain.    wisdom teeth removal     Patient Active Problem List   Diagnosis Date Noted   High risk medication use 06/04/2021   Elevated blood pressure reading 04/03/2021   Vitamin D deficiency 04/03/2021   Chronic pain of both knees 02/27/2021   Primary osteoarthritis of both knees 02/27/2021   Adjustment disorder with mixed anxiety and depressed mood 07/09/2017   Tobacco use 12/24/2016   Morbid obesity (HCC)    Partial idiopathic epilepsy with seizures of localized onset, not intractable, with status epilepticus (HCC) 11/28/2015    PCP: Andree Coss   REFERRING PROVIDER: Sanjuan Dame DIAG: [phone] (ICD-10-CM) - Chronic pain of left knee M79.672 (ICD-10-CM) - Pain of left  heel  THERAPY DIAG:  Chronic pain of both knees  Heel pain, chronic, left  Difficulty in walking, not elsewhere classified  Rationale for Evaluation and Treatment: Rehabilitation  ONSET DATE: a few yrs 2019. Seizures   SUBJECTIVE:   SUBJECTIVE STATEMENT: No pain in knees.  Went to the park this AM and even ran a bit . Heel pain, 6/10.   EVAL: Pt reports chronic knee pain, bilateral for several years.  She reports she began to have seizures several yes ago and that really set her back physically.  She was enrolled in the program [facility] and the [facility] and was working on nutrition, exercise and healthy lifestyle habit and she loves it.  She is limited at the gym with what she can do in her lower body and does not want to make it worse.  It started on the L knee  She has had to change her workout routine.  Pain is in both posterior knees. Her Rt knee gives out at times. She has difficulty walking, squatting, and esp.  lunging Pain in L heel is when she has a day of working out and after sitting. Pain in AM in L heel with the first steps she takes.  She has not had injections, no bracing or foot orthotics. She does have flat feet.   PERTINENT  HISTORY: Seizures, knee OA  PAIN:  Are you having pain? Yes: NPRS scale: 5/10 Pain location: Rt medial knee   Pain description: tightness , soreness  Aggravating factors: walking, steps , lunge, bending knee  Relieving factors: cream, ice pack, rolling her L foot out Wears [employer] to work out in, these are good.   Pain increases with barefoot.     PRECAUTIONS: None  RED FLAGS: None   WEIGHT BEARING RESTRICTIONS: No  FALLS:  Has patient fallen in last 6 months? No  LIVING ENVIRONMENT: Lives with: lives with their partner Lives in: House/apartment Stairs: Yes: Internal: 2 flights  steps; on right going up Has following equipment at home: None  OCCUPATION: not working   PLOF: Independent  PATIENT GOALS: I want to be able to run  someday.  Cont to workout. Yoga.   NEXT MD VISIT: See Dr. Roda Shutters in April   OBJECTIVE:  Note: Objective measures were completed at Evaluation unless otherwise noted.  DIAGNOSTIC FINDINGS: tricompartment osteoarthritis with mild  valgus alignment.   PATIENT SURVEYS:  LEFS 61%  COGNITION: Overall cognitive status: Within functional limits for tasks assessed     SENSATION: WFL  EDEMA:  Circumferential: NT   MUSCLE LENGTH: Hamstrings: Right 30 deg; Left 25 deg   POSTURE:  genu valgus, pes planus   PALPATION: Pain with palpation to posterior knee and medial Rt knee joint line   LOWER EXTREMITY ROM:  Active ROM Right eval Left eval  Hip flexion    Hip extension    Hip abduction    Hip adduction    Hip internal rotation    Hip external rotation    Knee flexion 120 110 pain   Knee extension -10 -8  Ankle dorsiflexion -5 -5  Ankle plantarflexion    Ankle inversion    Ankle eversion     (Blank rows = not tested)  LOWER EXTREMITY MMT:  MMT Right eval Left eval  Hip flexion 4 4  Hip extension NT NT   Hip abduction NT on eval NT on eval   Hip adduction    Hip internal rotation    Hip external rotation    Knee flexion 5 5  Knee extension 5 5  Ankle dorsiflexion 5 5  Ankle plantarflexion 4 4  Ankle inversion 4 4  Ankle eversion 4 4   (Blank rows = not tested)  LOWER EXTREMITY SPECIAL TESTS:  NT   FUNCTIONAL TESTS:  5 times sit to stand: 10.4 sec  2 minute walk test: NT   Single leg balance Rt 10 sec, L 20 sec   GAIT: Distance walked: 150+ Assistive device utilized: None Level of assistance: Complete Independence Comments: no deviations                                                                                                                                 TREATMENT DATE:   [facility] Adult PT Treatment:  DATE: 07/15/23 Therapeutic Activity: Calf stretch 30 sec x 3 L heel  Heel raises off step x 10   SLS on foam pad, brief each side  SLS hip hinge on foam with opp leg lift then on floor without UE assist  Prone hip extension x 2 x 15  Hip abduction x 2 x 15  Manual Therapy: Soft tissue work and trigger point therapy to L calf, heel with ankle DF stretching cross fiber friction at heel   [facility] Adult PT Treatment:                                                DATE: 07/11/23 Aquatic therapy at [facility]- [address] - therapeutic pool temp approximately 90 degrees. Pt enters building ambulating independently. Treatment took place in water 3.8 to  4 ft 8 in. deep depending upon activity.  Pt entered and exited the pool via stair and handrails independently. Patient entered water for aquatic therapy for first time and was introduced to principles and therapeutic effects of water as they ambulated and acclimated to pool.  Therapeutic Exercise: Aquatic Exercise: Walking forward/backwards/side stepping Lunge stepping forwards x2 laps Side stepping with rainbow DB shoulder abd/add x2 laps Warrior I pool noodle lift x10 BIL Standing with UE support edge of pool: Hip abd/add x20 BIL Squats 2x20 Heel/toe raises 2x20 Calf stretch 2x30" BIL Hip ext/flex with knee straight x 20 BIL Hip Circles CC/CCW x10 each BIL   Pt requires the buoyancy of water for active assisted exercises with buoyancy supported for strengthening and AROM exercises. Hydrostatic pressure also supports joints by unweighting joint load by at least 50 % in 3-4 feet depth water. 80% in chest to neck deep water. Water will provide assistance with movement using the current and laminar flow while the buoyancy reduces weight bearing. Pt requires the viscosity of the water for resistance with strengthening exercises.  [facility] Adult PT Treatment:                                                DATE: 07/08/23 Therapeutic Exercise: Recumbent bike L 2 for 5 min Manual Therapy: Soft tissue work Rt quads focus on medial joint ,  patellar mobs Therapeutic Activity: Supine 3 way hip stretch  Quad set Rt  SLR toes out x 15 Hamstring bridge  SLR hip abduction  Wall sit (pin in knees) Step up 4 inch to SLR Reverse step up (glute) x 10 each side  Modalities: Cold pack 10 min Rt knee   [facility] Adult PT Treatment:                                                DATE: 07/03/23 Therapeutic Activity: Supine post pelvic tilt/ab bracing x 10  Post tilt to bridge x 10  Elliptical 5 min L1  ramp 1  Supine hamstring and ITB stretch  SLR 2 x 10, 3 lbs toes up SLR with VMO x 10 , 3 lbs  Hip abduction x 10 x 3 lbs  No wgt hip abduction sweeps  x 10   Piriformis  stretch  Airex for simple balance exercises : narrow, tandem, EC, head turns Airex tandem hold Airex semi-tandem with alt. Arm Airex semi tandem with single arm horizontal abd    PATIENT EDUCATION:  Education details: see above  Person educated: Patient Education method: Explanation, Demonstration, and Verbal cues Education comprehension: verbalized understanding, returned demonstration, and needs further education  HOME EXERCISE PROGRAM: Access Code: [email or web address] URL: [email or web address] Date: 07/03/2023 Prepared by: Karie Mainland  Exercises - Supine Bridge with Resistance Band  - 1 x daily - 7 x weekly - 2 sets - 10 reps - 5 hold - Clamshell with Resistance  - 1 x daily - 7 x weekly - 2 sets - 10 reps - 5 hold - Supine Hamstring Stretch with Strap  - 1 x daily - 7 x weekly - 1 sets - 3 reps - 30 hold  ASSESSMENT:  CLINICAL IMPRESSION: Addressed ankle and foot today/heel pain with soft tissue work to improve ankle stability and control in standing.  Pt unable to do multiple reps of single leg work without UE assist.  Patient continues to benefit from skilled PT services on land and aquatic based and should be progressed as able to improve functional independence.   EVAL: Patient is a 48 y.o. female who was seen today for physical therapy  evaluation and treatment for bilateral knee pain, L heel pain .   OBJECTIVE IMPAIRMENTS: decreased balance, decreased mobility, difficulty walking, decreased ROM, decreased strength, hypomobility, increased fascial restrictions, impaired flexibility, postural dysfunction, obesity, and pain.   ACTIVITY LIMITATIONS: standing, squatting, stairs, and locomotion level  PARTICIPATION LIMITATIONS: interpersonal relationship, community activity, and fitness  PERSONAL FACTORS: 1-2 comorbidities: seizures, knee OA   are also affecting patient's functional outcome.   REHAB POTENTIAL: Excellent  CLINICAL DECISION MAKING: Evolving/moderate complexity  EVALUATION COMPLEXITY: Moderate   GOALS: Goals reviewed with patient? Yes  SHORT TERM GOALS: Target date: 07/07/2023   Pt will be able to show I with HEP initial  Baseline: unknown Goal status: met   2.  Pt will be able to modify her gym routine to accommodate knee pain  Baseline:  Goal status: met    LONG TERM GOALS: Target date: 08/18/2023    Pt will be able to show I with final HEP upon discharge  Baseline:  Goal status: INITIAL  2.  Pt will improve LEFS score by 10 points  Baseline: 49/80 Goal status: INITIAL  3.  Pt will be able to report only min heel pain with AM steps or sitting 30 min  Baseline: severe  Goal status: INITIAL  4.  Pt will be able to walk in the community as needed with knee pain < 2/10 Baseline:  Goal status: INITIAL  5.  Pt will perform single leg balance to calf raise x 10 in preparation for running  Baseline: 10-20 sec SLS  Goal status: INITIAL    PLAN:  PT FREQUENCY: 2x/week  PT DURATION: 6 weeks  PLANNED INTERVENTIONS: 97164- PT Re-evaluation, 97110-Therapeutic exercises, 97530- Therapeutic activity, 97112- Neuromuscular re-education, 97535- Self Care, 29528- Manual therapy, [phone]- Aquatic Therapy, Patient/Family education, Balance training, Taping, Dry Needling, Cryotherapy, and Moist  heat  PLAN FOR NEXT SESSION:cont strength in hips, foot/ankle stability , core and alternative ideas for gym ex for strength.  Aquatics week 2-3    Brave Dack, PT 07/15/2023, 11:41 AM   Karie Mainland, PT 07/15/23 11:41 AM Phone: [phone] Fax: [phone]

## 2023-07-15 ENCOUNTER — Encounter: Payer: Self-pay | Admitting: Physical Therapy

## 2023-07-15 ENCOUNTER — Ambulatory Visit: Payer: 59 | Admitting: Physical Therapy

## 2023-07-15 DIAGNOSIS — M25562 Pain in left knee: Secondary | ICD-10-CM | POA: Diagnosis not present

## 2023-07-15 DIAGNOSIS — G8929 Other chronic pain: Secondary | ICD-10-CM | POA: Diagnosis not present

## 2023-07-15 DIAGNOSIS — M25561 Pain in right knee: Secondary | ICD-10-CM | POA: Diagnosis not present

## 2023-07-15 DIAGNOSIS — R262 Difficulty in walking, not elsewhere classified: Secondary | ICD-10-CM | POA: Diagnosis not present

## 2023-07-15 DIAGNOSIS — M79672 Pain in left foot: Secondary | ICD-10-CM | POA: Diagnosis not present

## 2023-07-18 ENCOUNTER — Ambulatory Visit: Payer: 59

## 2023-07-21 NOTE — Therapy (Unsigned)
 OUTPATIENT PHYSICAL THERAPY LOWER EXTREMITY NOTE   Patient Name: Kristina Coleman MRN: [id number] DOB:[birth year], 48 y.o., female Today's Date: 07/22/2023  END OF SESSION:  PT End of Session - 07/22/23 1109     Visit Number 7    Number of Visits 12    Date for PT Re-Evaluation 08/04/23    Authorization Type UHC MCR and MCD    PT Start Time 1105    PT Stop Time 1146    PT Time Calculation (min) 41 min               Past Medical History:  Diagnosis Date   Anxiety    Arthritis    History of chicken pox    Idiopathic intracranial hypertension 12/27/2015   Lumbar Puncture: opening pressure 26cm of water. 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 Sherlean Foot ([facility])   Intracranial hypertension    Morbid obesity (HCC)    Recurrent seizures (HCC) 05/02/2018   Seizures (HCC) 11/2015   Tobacco use 12/24/2016   Past Surgical History:  Procedure Laterality Date   BREAST BIOPSY     CHOLECYSTECTOMY     left knee surgery     LUMBAR PUNCTURE     fluid removal in brain.    wisdom teeth removal     Patient Active Problem List   Diagnosis Date Noted   High risk medication use 06/04/2021   Elevated blood pressure reading 04/03/2021   Vitamin D deficiency 04/03/2021   Chronic pain of both knees 02/27/2021   Primary osteoarthritis of both knees 02/27/2021   Adjustment disorder with mixed anxiety and depressed mood 07/09/2017   Tobacco use 12/24/2016   Morbid obesity (HCC)    Partial idiopathic epilepsy with seizures of localized onset, not intractable, with status epilepticus (HCC) 11/28/2015    PCP: Andree Coss   REFERRING PROVIDER: Sanjuan Dame DIAG: [phone] (ICD-10-CM) - Chronic pain of left knee M79.672 (ICD-10-CM) - Pain of left heel  THERAPY DIAG:  Chronic pain of both knees  Heel pain, chronic, left  Difficulty in walking, not elsewhere  classified  Rationale for Evaluation and Treatment: Rehabilitation  ONSET DATE: a few yrs 2019. Seizures   SUBJECTIVE:   SUBJECTIVE STATEMENT: Pt missed the pool appt last week due to having her cycle.  During this time she feels really tired the entire time.  She used to have seizures during this time in the past so she knows to listen to her body during that time.  She is looking for a new MD to help her with this. More L knee pain than Rt.  Pain in her heel is moderate.   EVAL: Pt reports chronic knee pain, bilateral for several years.  She reports she began to have seizures several yes ago and that really set her back physically.  She was enrolled in the program [facility] and the [facility] and was working on nutrition, exercise and healthy lifestyle habit and she loves it.  She is limited at the gym with what she can do in her lower body and does not want to make it worse.  It started on the L knee  She has had to change her workout routine.  Pain is in both posterior knees. Her Rt knee gives out at times. She has difficulty walking, squatting, and esp.  lunging Pain in L heel is when she has a day of working out  and after sitting. Pain in AM in L heel with the first steps she takes.  She has not had injections, no bracing or foot orthotics. She does have flat feet.   PERTINENT HISTORY: Seizures, knee OA  PAIN:  Are you having pain? Yes: NPRS scale: 4-6/10 Pain location: Rt medial knee   Pain description: tightness , soreness  Aggravating factors: walking, steps , lunge, bending knee  Relieving factors: cream, ice pack, rolling her L foot out Wears [employer] to work out in, these are good.   Pain increases with barefoot.     PRECAUTIONS: None  RED FLAGS: None   WEIGHT BEARING RESTRICTIONS: No  FALLS:  Has patient fallen in last 6 months? No  LIVING ENVIRONMENT: Lives with: lives with their partner Lives in: House/apartment Stairs: Yes: Internal: 2 flights  steps; on right going  up Has following equipment at home: None  OCCUPATION: not working   PLOF: Independent  PATIENT GOALS: I want to be able to run someday.  Cont to workout. Yoga.   NEXT MD VISIT: See Dr. Roda Shutters in April   OBJECTIVE:  Note: Objective measures were completed at Evaluation unless otherwise noted.  DIAGNOSTIC FINDINGS: tricompartment osteoarthritis with mild  valgus alignment.   PATIENT SURVEYS:  LEFS 61%  COGNITION: Overall cognitive status: Within functional limits for tasks assessed     SENSATION: WFL  EDEMA:  Circumferential: NT   MUSCLE LENGTH: Hamstrings: Right 30 deg; Left 25 deg   POSTURE:  genu valgus, pes planus   PALPATION: Pain with palpation to posterior knee and medial Rt knee joint line   LOWER EXTREMITY ROM:  Active ROM Right eval Left eval  Hip flexion    Hip extension    Hip abduction    Hip adduction    Hip internal rotation    Hip external rotation    Knee flexion 120 110 pain   Knee extension -10 -8  Ankle dorsiflexion -5 -5  Ankle plantarflexion    Ankle inversion    Ankle eversion     (Blank rows = not tested)  LOWER EXTREMITY MMT:  MMT Right eval Left eval  Hip flexion 4 4  Hip extension NT NT   Hip abduction NT on eval NT on eval   Hip adduction    Hip internal rotation    Hip external rotation    Knee flexion 5 5  Knee extension 5 5  Ankle dorsiflexion 5 5  Ankle plantarflexion 4 4  Ankle inversion 4 4  Ankle eversion 4 4   (Blank rows = not tested)  LOWER EXTREMITY SPECIAL TESTS:  NT   FUNCTIONAL TESTS:  5 times sit to stand: 10.4 sec  2 minute walk test: NT   Single leg balance Rt 10 sec, L 20 sec   GAIT: Distance walked: 150+ Assistive device utilized: None Level of assistance: Complete Independence Comments: no deviations  TREATMENT DATE:    [facility] Adult PT Treatment:                                                 DATE: 07/22/23 Therapeutic Activity: NuStep L6 UE and LE for 5 min  Slantboard x 1 min  Hamstring/ITB/Adductor with strap Supine bridge with ball between knees  x 10  Ball squeeze with knee extension x 10  SLR 4 sets , 2 x 10 with toe up then out Quadruped bird dog long hold x 3 each for core stability, max cues needed  Sit to stand it knee march ,  10 lbs x 10   Self Care: Causes of fatigue with cycle, hormones effect on joint pain and fatigue   [facility] Adult PT Treatment:                                                DATE: 07/15/23 Therapeutic Activity: Calf stretch 30 sec x 3 L heel  Heel raises off step x 10  SLS on foam pad, brief each side  SLS hip hinge on foam with opp leg lift then on floor without UE assist  Prone hip extension x 2 x 15  Hip abduction x 2 x 15  Manual Therapy: Soft tissue work and trigger point therapy to L calf, heel with ankle DF stretching cross fiber friction at heel   [facility] Adult PT Treatment:                                                DATE: 07/11/23 Aquatic therapy at [facility]- [address] - therapeutic pool temp approximately 90 degrees. Pt enters building ambulating independently. Treatment took place in water 3.8 to  4 ft 8 in. deep depending upon activity.  Pt entered and exited the pool via stair and handrails independently. Patient entered water for aquatic therapy for first time and was introduced to principles and therapeutic effects of water as they ambulated and acclimated to pool.  Therapeutic Exercise: Aquatic Exercise: Walking forward/backwards/side stepping Lunge stepping forwards x2 laps Side stepping with rainbow DB shoulder abd/add x2 laps Warrior I pool noodle lift x10 BIL Standing with UE support edge of pool: Hip abd/add x20 BIL Squats 2x20 Heel/toe raises 2x20 Calf stretch 2x30" BIL Hip ext/flex with knee straight x 20 BIL Hip Circles CC/CCW x10 each BIL   Pt requires the  buoyancy of water for active assisted exercises with buoyancy supported for strengthening and AROM exercises. Hydrostatic pressure also supports joints by unweighting joint load by at least 50 % in 3-4 feet depth water. 80% in chest to neck deep water. Water will provide assistance with movement using the current and laminar flow while the buoyancy reduces weight bearing. Pt requires the viscosity of the water for resistance with strengthening exercises.  [facility] Adult PT Treatment:  DATE: 07/08/23 Therapeutic Exercise: Recumbent bike L 2 for 5 min Manual Therapy: Soft tissue work Rt quads focus on medial joint , patellar mobs Therapeutic Activity: Supine 3 way hip stretch  Quad set Rt  SLR toes out x 15 Hamstring bridge  SLR hip abduction  Wall sit (pin in knees) Step up 4 inch to SLR Reverse step up (glute) x 10 each side  Modalities: Cold pack 10 min Rt knee   [facility] Adult PT Treatment:                                                DATE: 07/03/23 Therapeutic Activity: Supine post pelvic tilt/ab bracing x 10  Post tilt to bridge x 10  Elliptical 5 min L1  ramp 1  Supine hamstring and ITB stretch  SLR 2 x 10, 3 lbs toes up SLR with VMO x 10 , 3 lbs  Hip abduction x 10 x 3 lbs  No wgt hip abduction sweeps  x 10   Piriformis stretch  Airex for simple balance exercises : narrow, tandem, EC, head turns Airex tandem hold Airex semi-tandem with alt. Arm Airex semi tandem with single arm horizontal abd    PATIENT EDUCATION:  Education details: see above  Person educated: Patient Education method: Explanation, Demonstration, and Verbal cues Education comprehension: verbalized understanding, returned demonstration, and needs further education  HOME EXERCISE PROGRAM: Access Code: [email or web address] URL: [email or web address] Date: 07/03/2023 Prepared by: Karie Mainland  Exercises - Supine Bridge with Resistance Band  - 1 x daily - 7 x  weekly - 2 sets - 10 reps - 5 hold - Clamshell with Resistance  - 1 x daily - 7 x weekly - 2 sets - 10 reps - 5 hold - Supine Hamstring Stretch with Strap  - 1 x daily - 7 x weekly - 1 sets - 3 reps - 30 hold  ASSESSMENT:  CLINICAL IMPRESSION: Patient missed an appt (see above).  She is back to her baseline of mild to moderate LE pain.  She was able to perform mat level hip and knee exercises to improve her strength and endurance, and quad activation.  She will be back in the pool this week Patient continues to benefit from skilled PT services on land and aquatic based and should be progressed as able to improve functional independence.   EVAL: Patient is a 48 y.o. female who was seen today for physical therapy evaluation and treatment for bilateral knee pain, L heel pain .   OBJECTIVE IMPAIRMENTS: decreased balance, decreased mobility, difficulty walking, decreased ROM, decreased strength, hypomobility, increased fascial restrictions, impaired flexibility, postural dysfunction, obesity, and pain.   ACTIVITY LIMITATIONS: standing, squatting, stairs, and locomotion level  PARTICIPATION LIMITATIONS: interpersonal relationship, community activity, and fitness  PERSONAL FACTORS: 1-2 comorbidities: seizures, knee OA   are also affecting patient's functional outcome.   REHAB POTENTIAL: Excellent  CLINICAL DECISION MAKING: Evolving/moderate complexity  EVALUATION COMPLEXITY: Moderate   GOALS: Goals reviewed with patient? Yes  SHORT TERM GOALS: Target date: 07/07/2023   Pt will be able to show I with HEP initial  Baseline: unknown Goal status: met   2.  Pt will be able to modify her gym routine to accommodate knee pain  Baseline:  Goal status: met    LONG TERM GOALS: Target date: 08/18/2023    Pt will be able to show  I with final HEP upon discharge  Baseline:  Goal status: INITIAL  2.  Pt will improve LEFS score by 10 points  Baseline: 49/80 Goal status: INITIAL  3.  Pt will  be able to report only min heel pain with AM steps or sitting 30 min  Baseline: severe  Goal status: INITIAL  4.  Pt will be able to walk in the community as needed with knee pain < 2/10 Baseline:  Goal status: INITIAL  5.  Pt will perform single leg balance to calf raise x 10 in preparation for running  Baseline: 10-20 sec SLS  Goal status: INITIAL    PLAN:  PT FREQUENCY: 2x/week  PT DURATION: 6 weeks  PLANNED INTERVENTIONS: 97164- PT Re-evaluation, 97110-Therapeutic exercises, 97530- Therapeutic activity, 97112- Neuromuscular re-education, 97535- Self Care, 11914- Manual therapy, [phone]- Aquatic Therapy, Patient/Family education, Balance training, Taping, Dry Needling, Cryotherapy, and Moist heat  PLAN FOR NEXT SESSION:cont strength in hips, foot/ankle stability , core and alternative ideas for gym ex for strength.  Aquatics week 2-3    Ayham Word, PT 07/22/2023, 12:44 PM   Karie Mainland, PT 07/22/23 12:44 PM Phone: [phone] Fax: [phone]

## 2023-07-22 ENCOUNTER — Encounter: Payer: Self-pay | Admitting: Physical Therapy

## 2023-07-22 ENCOUNTER — Ambulatory Visit: Payer: 59 | Admitting: Physical Therapy

## 2023-07-22 DIAGNOSIS — M79672 Pain in left foot: Secondary | ICD-10-CM | POA: Diagnosis not present

## 2023-07-22 DIAGNOSIS — R262 Difficulty in walking, not elsewhere classified: Secondary | ICD-10-CM

## 2023-07-22 DIAGNOSIS — G8929 Other chronic pain: Secondary | ICD-10-CM

## 2023-07-22 DIAGNOSIS — M25562 Pain in left knee: Secondary | ICD-10-CM | POA: Diagnosis not present

## 2023-07-22 DIAGNOSIS — M25561 Pain in right knee: Secondary | ICD-10-CM | POA: Diagnosis not present

## 2023-07-25 ENCOUNTER — Other Ambulatory Visit: Payer: Self-pay | Admitting: Neurology

## 2023-07-25 ENCOUNTER — Ambulatory Visit: Payer: 59

## 2023-07-25 DIAGNOSIS — R262 Difficulty in walking, not elsewhere classified: Secondary | ICD-10-CM | POA: Diagnosis not present

## 2023-07-25 DIAGNOSIS — G40009 Localization-related (focal) (partial) idiopathic epilepsy and epileptic syndromes with seizures of localized onset, not intractable, without status epilepticus: Secondary | ICD-10-CM

## 2023-07-25 DIAGNOSIS — G8929 Other chronic pain: Secondary | ICD-10-CM

## 2023-07-25 DIAGNOSIS — M79672 Pain in left foot: Secondary | ICD-10-CM | POA: Diagnosis not present

## 2023-07-25 DIAGNOSIS — M25562 Pain in left knee: Secondary | ICD-10-CM | POA: Diagnosis not present

## 2023-07-25 DIAGNOSIS — M25561 Pain in right knee: Secondary | ICD-10-CM | POA: Diagnosis not present

## 2023-07-25 NOTE — Therapy (Signed)
 OUTPATIENT PHYSICAL THERAPY LOWER EXTREMITY NOTE   Patient Name: Kristina Coleman MRN: [id number] DOB:May 31, 1975, 48 y.o., female Today's Date: 07/25/2023  END OF SESSION:  PT End of Session - 07/25/23 1426     Visit Number 8    Number of Visits 12    Date for PT Re-Evaluation 08/04/23    Authorization Type UHC MCR and MCD    PT Start Time 1425    PT Stop Time 1510    PT Time Calculation (min) 45 min    Activity Tolerance Patient tolerated treatment well    Behavior During Therapy WFL for tasks assessed/performed            Past Medical History:  Diagnosis Date   Anxiety    Arthritis    History of chicken pox    Idiopathic intracranial hypertension 12/27/2015   Lumbar Puncture: opening pressure 26cm of water. 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 Sherlean Foot ([facility])   Intracranial hypertension    Morbid obesity (HCC)    Recurrent seizures (HCC) 05/02/2018   Seizures (HCC) 11/2015   Tobacco use 12/24/2016   Past Surgical History:  Procedure Laterality Date   BREAST BIOPSY     CHOLECYSTECTOMY     left knee surgery     LUMBAR PUNCTURE     fluid removal in brain.    wisdom teeth removal     Patient Active Problem List   Diagnosis Date Noted   High risk medication use 06/04/2021   Elevated blood pressure reading 04/03/2021   Vitamin D deficiency 04/03/2021   Chronic pain of both knees 02/27/2021   Primary osteoarthritis of both knees 02/27/2021   Adjustment disorder with mixed anxiety and depressed mood 07/09/2017   Tobacco use 12/24/2016   Morbid obesity (HCC)    Partial idiopathic epilepsy with seizures of localized onset, not intractable, with status epilepticus (HCC) 11/28/2015    PCP: Andree Coss   REFERRING PROVIDER: Sanjuan Dame DIAG: [phone] (ICD-10-CM) - Chronic pain of left knee M79.672 (ICD-10-CM) - Pain of left heel  THERAPY  DIAG:  Chronic pain of both knees  Heel pain, chronic, left  Difficulty in walking, not elsewhere classified  Rationale for Evaluation and Treatment: Rehabilitation  ONSET DATE: a few yrs 2019. Seizures   SUBJECTIVE:   SUBJECTIVE STATEMENT: Patient reports that she has decreased levels of energy this week due to having her cycle last week and in the past she has had issues with her epilepsy during this time. She states she is being follow by neurology for this. She is having some BIL knee and Lt heel pain today.   EVAL: Pt reports chronic knee pain, bilateral for several years.  She reports she began to have seizures several yes ago and that really set her back physically.  She was enrolled in the [facility] and [facility] and was working on nutrition, exercise and healthy lifestyle habit and she loves it.  She is limited at the gym with what she can do in her lower body and does not want to make it worse.  It started on the L knee  She has had to change her workout routine.  Pain is in both posterior knees. Her Rt knee gives out at times. She has difficulty walking, squatting, and esp.  lunging Pain in L heel is when she has a day of working out and after sitting.  Pain in AM in L heel with the first steps she takes.  She has not had injections, no bracing or foot orthotics. She does have flat feet.   PERTINENT HISTORY: Seizures, knee OA  PAIN:  Are you having pain? Yes: NPRS scale: 4-6/10 Pain location: Rt medial knee   Pain description: tightness , soreness  Aggravating factors: walking, steps , lunge, bending knee  Relieving factors: cream, ice pack, rolling her L foot out Wears [employer] to work out in, these are good.   Pain increases with barefoot.     PRECAUTIONS: None  RED FLAGS: None   WEIGHT BEARING RESTRICTIONS: No  FALLS:  Has patient fallen in last 6 months? No  LIVING ENVIRONMENT: Lives with: lives with their partner Lives in: House/apartment Stairs:  Yes: Internal: 2 flights  steps; on right going up Has following equipment at home: None  OCCUPATION: not working   PLOF: Independent  PATIENT GOALS: I want to be able to run someday.  Cont to workout. Yoga.   NEXT MD VISIT: See Dr. Roda Shutters in April   OBJECTIVE:  Note: Objective measures were completed at Evaluation unless otherwise noted.  DIAGNOSTIC FINDINGS: tricompartment osteoarthritis with mild  valgus alignment.   PATIENT SURVEYS:  LEFS 61%  COGNITION: Overall cognitive status: Within functional limits for tasks assessed     SENSATION: WFL  EDEMA:  Circumferential: NT   MUSCLE LENGTH: Hamstrings: Right 30 deg; Left 25 deg   POSTURE:  genu valgus, pes planus   PALPATION: Pain with palpation to posterior knee and medial Rt knee joint line   LOWER EXTREMITY ROM:  Active ROM Right eval Left eval  Hip flexion    Hip extension    Hip abduction    Hip adduction    Hip internal rotation    Hip external rotation    Knee flexion 120 110 pain   Knee extension -10 -8  Ankle dorsiflexion -5 -5  Ankle plantarflexion    Ankle inversion    Ankle eversion     (Blank rows = not tested)  LOWER EXTREMITY MMT:  MMT Right eval Left eval  Hip flexion 4 4  Hip extension NT NT   Hip abduction NT on eval NT on eval   Hip adduction    Hip internal rotation    Hip external rotation    Knee flexion 5 5  Knee extension 5 5  Ankle dorsiflexion 5 5  Ankle plantarflexion 4 4  Ankle inversion 4 4  Ankle eversion 4 4   (Blank rows = not tested)  LOWER EXTREMITY SPECIAL TESTS:  NT   FUNCTIONAL TESTS:  5 times sit to stand: 10.4 sec  2 minute walk test: NT   Single leg balance Rt 10 sec, L 20 sec   GAIT: Distance walked: 150+ Assistive device utilized: None Level of assistance: Complete Independence Comments: no deviations  TREATMENT  DATE:  [facility] Adult PT Treatment:                                                DATE: 07/11/23 Aquatic therapy at [facility]- [address] - therapeutic pool temp approximately 90 degrees. Pt enters building ambulating independently. Treatment took place in water 3.8 to  4 ft 8 in. deep depending upon activity.  Pt entered and exited the pool via stair and handrails independently.  Therapeutic Exercise: Aquatic Exercise: Walking forward/backwards/side stepping x 2 laps ea Side stepping with yellow DB shoulder abd/add x2 laps Tandem stance 2x30" BIL Tandem walking x1 lap - frequent LOB, able to self correct Runners stretch on bottom step x30" BIL Hamstring stretch on bottom step x30" BIL Standing with UE support edge of pool: Hip abd/add x20 BIL Squats 2x20 Heel/toe raises 2x20 Calf stretch 2x30" BIL Hip ext/flex with knee straight x 20 BIL Hip Circles CW/CCW x10 each BIL   Pt requires the buoyancy of water for active assisted exercises with buoyancy supported for strengthening and AROM exercises. Hydrostatic pressure also supports joints by unweighting joint load by at least 50 % in 3-4 feet depth water. 80% in chest to neck deep water. Water will provide assistance with movement using the current and laminar flow while the buoyancy reduces weight bearing. Pt requires the viscosity of the water for resistance with strengthening exercises.  [facility] Adult PT Treatment:                                                DATE: 07/22/23 Therapeutic Activity: NuStep L6 UE and LE for 5 min  Slantboard x 1 min  Hamstring/ITB/Adductor with strap Supine bridge with ball between knees  x 10  Ball squeeze with knee extension x 10  SLR 4 sets , 2 x 10 with toe up then out Quadruped bird dog long hold x 3 each for core stability, max cues needed  Sit to stand it knee march ,  10 lbs x 10   Self Care: Causes of fatigue with cycle, hormones effect on joint pain and fatigue   [facility] Adult PT  Treatment:                                                DATE: 07/15/23 Therapeutic Activity: Calf stretch 30 sec x 3 L heel  Heel raises off step x 10  SLS on foam pad, brief each side  SLS hip hinge on foam with opp leg lift then on floor without UE assist  Prone hip extension x 2 x 15  Hip abduction x 2 x 15  Manual Therapy: Soft tissue work and trigger point therapy to L calf, heel with ankle DF stretching cross fiber friction at heel     PATIENT EDUCATION:  Education details: see above  Person educated: Patient Education method: Programmer, multimedia, Facilities manager, and Verbal cues Education comprehension: verbalized understanding, returned demonstration, and needs further education  HOME EXERCISE PROGRAM: Access Code: [email or web address] URL: [email or web address] Date: 07/03/2023 Prepared by: Karie Mainland  Exercises - Supine Bridge with Resistance Band  -  1 x daily - 7 x weekly - 2 sets - 10 reps - 5 hold - Clamshell with Resistance  - 1 x daily - 7 x weekly - 2 sets - 10 reps - 5 hold - Supine Hamstring Stretch with Strap  - 1 x daily - 7 x weekly - 1 sets - 3 reps - 30 hold  ASSESSMENT:  CLINICAL IMPRESSION: Patient presents to aquatic PT session reporting continued BIL knee and heel pain and that she has very little energy this week. Session today focused on LE strengthening, balance, and stretching in the aquatic environment for use of buoyancy to offload joints and the viscosity of water as resistance during therapeutic exercise. Patient was able to tolerate all prescribed exercises in the aquatic environment with no adverse effects. Patient continues to benefit from skilled PT services on land and aquatic based and should be progressed as able to improve functional independence.   EVAL: Patient is a 48 y.o. female who was seen today for physical therapy evaluation and treatment for bilateral knee pain, L heel pain .   OBJECTIVE IMPAIRMENTS: decreased balance, decreased  mobility, difficulty walking, decreased ROM, decreased strength, hypomobility, increased fascial restrictions, impaired flexibility, postural dysfunction, obesity, and pain.   ACTIVITY LIMITATIONS: standing, squatting, stairs, and locomotion level  PARTICIPATION LIMITATIONS: interpersonal relationship, community activity, and fitness  PERSONAL FACTORS: 1-2 comorbidities: seizures, knee OA   are also affecting patient's functional outcome.   REHAB POTENTIAL: Excellent  CLINICAL DECISION MAKING: Evolving/moderate complexity  EVALUATION COMPLEXITY: Moderate   GOALS: Goals reviewed with patient? Yes  SHORT TERM GOALS: Target date: 07/07/2023   Pt will be able to show I with HEP initial  Baseline: unknown Goal status: met   2.  Pt will be able to modify her gym routine to accommodate knee pain  Baseline:  Goal status: met    LONG TERM GOALS: Target date: 08/18/2023    Pt will be able to show I with final HEP upon discharge  Baseline:  Goal status: INITIAL  2.  Pt will improve LEFS score by 10 points  Baseline: 49/80 Goal status: INITIAL  3.  Pt will be able to report only min heel pain with AM steps or sitting 30 min  Baseline: severe  Goal status: INITIAL  4.  Pt will be able to walk in the community as needed with knee pain < 2/10 Baseline:  Goal status: INITIAL  5.  Pt will perform single leg balance to calf raise x 10 in preparation for running  Baseline: 10-20 sec SLS  Goal status: INITIAL    PLAN:  PT FREQUENCY: 2x/week  PT DURATION: 6 weeks  PLANNED INTERVENTIONS: 97164- PT Re-evaluation, 97110-Therapeutic exercises, 97530- Therapeutic activity, 97112- Neuromuscular re-education, 97535- Self Care, 16109- Manual therapy, [phone]- Aquatic Therapy, Patient/Family education, Balance training, Taping, Dry Needling, Cryotherapy, and Moist heat  PLAN FOR NEXT SESSION:cont strength in hips, foot/ankle stability , core and alternative ideas for gym ex for strength.   Aquatics week 2-3    Berta Minor, PTA 07/25/2023, 3:12 PM

## 2023-07-28 ENCOUNTER — Ambulatory Visit: Admitting: Physical Therapy

## 2023-07-28 ENCOUNTER — Encounter: Payer: Self-pay | Admitting: Physical Therapy

## 2023-07-28 DIAGNOSIS — R262 Difficulty in walking, not elsewhere classified: Secondary | ICD-10-CM

## 2023-07-28 DIAGNOSIS — G8929 Other chronic pain: Secondary | ICD-10-CM

## 2023-07-28 DIAGNOSIS — M25561 Pain in right knee: Secondary | ICD-10-CM | POA: Diagnosis not present

## 2023-07-28 DIAGNOSIS — M79672 Pain in left foot: Secondary | ICD-10-CM | POA: Diagnosis not present

## 2023-07-28 DIAGNOSIS — M25562 Pain in left knee: Secondary | ICD-10-CM | POA: Diagnosis not present

## 2023-07-28 NOTE — Therapy (Signed)
 OUTPATIENT PHYSICAL THERAPY LOWER EXTREMITY NOTE   Patient Name: Kristina Coleman MRN: [id number] DOB:[birth year], 48 y.o., female Today's Date: 07/28/2023  END OF SESSION:  PT End of Session - 07/28/23 1420     Visit Number 9    Number of Visits 12    Date for PT Re-Evaluation 08/04/23    Authorization Type [employer] MCR and MCD    PT Start Time 1416    PT Stop Time 1500    PT Time Calculation (min) 44 min    Activity Tolerance Patient tolerated treatment well    Behavior During Therapy WFL for tasks assessed/performed            Past Medical History:  Diagnosis Date   Anxiety    Arthritis    History of chicken pox    Idiopathic intracranial hypertension 12/27/2015   Lumbar Puncture: opening pressure 26cm of water. 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 Sherlean Foot ([facility])   Intracranial hypertension    Morbid obesity (HCC)    Recurrent seizures (HCC) 05/02/2018   Seizures (HCC) 11/2015   Tobacco use 12/24/2016   Past Surgical History:  Procedure Laterality Date   BREAST BIOPSY     CHOLECYSTECTOMY     left knee surgery     LUMBAR PUNCTURE     fluid removal in brain.    wisdom teeth removal     Patient Active Problem List   Diagnosis Date Noted   High risk medication use 06/04/2021   Elevated blood pressure reading 04/03/2021   Vitamin D deficiency 04/03/2021   Chronic pain of both knees 02/27/2021   Primary osteoarthritis of both knees 02/27/2021   Adjustment disorder with mixed anxiety and depressed mood 07/09/2017   Tobacco use 12/24/2016   Morbid obesity (HCC)    Partial idiopathic epilepsy with seizures of localized onset, not intractable, with status epilepticus (HCC) 11/28/2015    PCP: Andree Coss   REFERRING PROVIDER: Sanjuan Dame DIAG: [phone] (ICD-10-CM) - Chronic pain of left knee M79.672 (ICD-10-CM) - Pain of left heel  THERAPY  DIAG:  Chronic pain of both knees  Heel pain, chronic, left  Difficulty in walking, not elsewhere classified  Rationale for Evaluation and Treatment: Rehabilitation  ONSET DATE: a few yrs 2019. Seizures   SUBJECTIVE:   SUBJECTIVE STATEMENT: OK today, not hurting much.   EVAL: Pt reports chronic knee pain, bilateral for several years.  She reports she began to have seizures several yes ago and that really set her back physically.  She was enrolled in the [facility] and the [facility] and was working on nutrition, exercise and healthy lifestyle habit and she loves it.  She is limited at the gym with what she can do in her lower body and does not want to make it worse.  It started on the L knee  She has had to change her workout routine.  Pain is in both posterior knees. Her Rt knee gives out at times. She has difficulty walking, squatting, and esp.  lunging Pain in L heel is when she has a day of working out and after sitting. Pain in AM in L heel with the first steps she takes.  She has not had injections, no bracing or foot orthotics. She does have flat feet.   PERTINENT HISTORY: Seizures, knee OA  PAIN:  Are you having pain? Yes: NPRS scale: 4-6/10 Pain location:  Rt medial knee   Pain description: tightness , soreness  Aggravating factors: walking, steps , lunge, bending knee  Relieving factors: cream, ice pack, rolling her L foot out Wears [employer] to work out in, these are good.   Pain increases with barefoot.     PRECAUTIONS: None  RED FLAGS: None   WEIGHT BEARING RESTRICTIONS: No  FALLS:  Has patient fallen in last 6 months? No  LIVING ENVIRONMENT: Lives with: lives with their partner Lives in: House/apartment Stairs: Yes: Internal: 2 flights  steps; on right going up Has following equipment at home: None  OCCUPATION: not working   PLOF: Independent  PATIENT GOALS: I want to be able to run someday.  Cont to workout. Yoga.   NEXT MD VISIT: See Dr. Roda Shutters in  April   OBJECTIVE:  Note: Objective measures were completed at Evaluation unless otherwise noted.  DIAGNOSTIC FINDINGS: tricompartment osteoarthritis with mild  valgus alignment.   PATIENT SURVEYS:  LEFS 61%  COGNITION: Overall cognitive status: Within functional limits for tasks assessed     SENSATION: WFL  EDEMA:  Circumferential: NT   MUSCLE LENGTH: Hamstrings: Right 30 deg; Left 25 deg   POSTURE:  genu valgus, pes planus   PALPATION: Pain with palpation to posterior knee and medial Rt knee joint line   LOWER EXTREMITY ROM:  Active ROM Right eval Left eval  Hip flexion    Hip extension    Hip abduction    Hip adduction    Hip internal rotation    Hip external rotation    Knee flexion 120 110 pain   Knee extension -10 -8  Ankle dorsiflexion -5 -5  Ankle plantarflexion    Ankle inversion    Ankle eversion     (Blank rows = not tested)  LOWER EXTREMITY MMT:  MMT Right eval Left eval  Hip flexion 4 4  Hip extension NT NT   Hip abduction NT on eval NT on eval   Hip adduction    Hip internal rotation    Hip external rotation    Knee flexion 5 5  Knee extension 5 5  Ankle dorsiflexion 5 5  Ankle plantarflexion 4 4  Ankle inversion 4 4  Ankle eversion 4 4   (Blank rows = not tested)  LOWER EXTREMITY SPECIAL TESTS:  NT   FUNCTIONAL TESTS:  5 times sit to stand: 10.4 sec  2 minute walk test: NT   Single leg balance Rt 10 sec, L 20 sec   GAIT: Distance walked: 150+ Assistive device utilized: None Level of assistance: Complete Independence Comments: no deviations                                                                                                                                 TREATMENT DATE:   [facility] Adult PT Treatment:  DATE: 07/28/23 Therapeutic Activity Elliptical 5 min L 1 resistance , 6 ramp  Hamstring ITB stretching/calf slant board  Calf raise from 1/2 foam roller double x  10 and then L single leg  Balance SLS on 1/2 foam  Tandem on 1/2 foam  SLS on the floor  Tennis ball rolling and pumping along foot  Manual Therapy: Heel, calf and achilles soft tissue   [facility] Adult PT Treatment:                                                DATE: 07/11/23 Aquatic therapy at [facility]- [address] - therapeutic pool temp approximately 90 degrees. Pt enters building ambulating independently. Treatment took place in water 3.8 to  4 ft 8 in. deep depending upon activity.  Pt entered and exited the pool via stair and handrails independently.  Therapeutic Exercise: Aquatic Exercise: Walking forward/backwards/side stepping x 2 laps ea Side stepping with yellow DB shoulder abd/add x2 laps Tandem stance 2x30" BIL Tandem walking x1 lap - frequent LOB, able to self correct Runners stretch on bottom step x30" BIL Hamstring stretch on bottom step x30" BIL Standing with UE support edge of pool: Hip abd/add x20 BIL Squats 2x20 Heel/toe raises 2x20 Calf stretch 2x30" BIL Hip ext/flex with knee straight x 20 BIL Hip Circles CW/CCW x10 each BIL   Pt requires the buoyancy of water for active assisted exercises with buoyancy supported for strengthening and AROM exercises. Hydrostatic pressure also supports joints by unweighting joint load by at least 50 % in 3-4 feet depth water. 80% in chest to neck deep water. Water will provide assistance with movement using the current and laminar flow while the buoyancy reduces weight bearing. Pt requires the viscosity of the water for resistance with strengthening exercises.  [facility] Adult PT Treatment:                                                DATE: 07/22/23 Therapeutic Activity: NuStep L6 UE and LE for 5 min  Slantboard x 1 min  Hamstring/ITB/Adductor with strap Supine bridge with ball between knees  x 10  Ball squeeze with knee extension x 10  SLR 4 sets , 2 x 10 with toe up then out Quadruped bird dog long hold x 3 each for  core stability, max cues needed  Sit to stand it knee march ,  10 lbs x 10   Self Care: Causes of fatigue with cycle, hormones effect on joint pain and fatigue   [facility] Adult PT Treatment:                                                DATE: 07/15/23 Therapeutic Activity: Calf stretch 30 sec x 3 L heel  Heel raises off step x 10  SLS on foam pad, brief each side  SLS hip hinge on foam with opp leg lift then on floor without UE assist  Prone hip extension x 2 x 15  Hip abduction x 2 x 15  Manual Therapy: Soft tissue work and trigger point therapy to L  calf, heel with ankle DF stretching cross fiber friction at heel     PATIENT EDUCATION:  Education details: see above  Person educated: Patient Education method: Explanation, Demonstration, and Verbal cues Education comprehension: verbalized understanding, returned demonstration, and needs further education  HOME EXERCISE PROGRAM: Access Code: [email or web address] URL: [email or web address] Date: 07/03/2023 Prepared by: Karie Mainland  Exercises - Supine Bridge with Resistance Band  - 1 x daily - 7 x weekly - 2 sets - 10 reps - 5 hold - Clamshell with Resistance  - 1 x daily - 7 x weekly - 2 sets - 10 reps - 5 hold - Supine Hamstring Stretch with Strap  - 1 x daily - 7 x weekly - 1 sets - 3 reps - 30 hold - SLS SL heel raise  ASSESSMENT:  CLINICAL IMPRESSION: Patient is doing well, had taken a balance class already (yoga/pilates).  Worked standing exercises for foot and ankle.  Patient will complete therapy this week take some time to see how she does on her own and return for reevaluation in a couple of weeks.  She may be ready to be discharged at this time or alternatively continue once a week for few more weeks  EVAL: Patient is a 48 y.o. female who was seen today for physical therapy evaluation and treatment for bilateral knee pain, L heel pain .   OBJECTIVE IMPAIRMENTS: decreased balance, decreased mobility, difficulty  walking, decreased ROM, decreased strength, hypomobility, increased fascial restrictions, impaired flexibility, postural dysfunction, obesity, and pain.   ACTIVITY LIMITATIONS: standing, squatting, stairs, and locomotion level  PARTICIPATION LIMITATIONS: interpersonal relationship, community activity, and fitness  PERSONAL FACTORS: 1-2 comorbidities: seizures, knee OA   are also affecting patient's functional outcome.   REHAB POTENTIAL: Excellent  CLINICAL DECISION MAKING: Evolving/moderate complexity  EVALUATION COMPLEXITY: Moderate   GOALS: Goals reviewed with patient? Yes  SHORT TERM GOALS: Target date: 07/07/2023   Pt will be able to show I with HEP initial  Baseline: unknown Goal status: met   2.  Pt will be able to modify her gym routine to accommodate knee pain  Baseline:  Goal status: met    LONG TERM GOALS: Target date: 08/18/2023    Pt will be able to show I with final HEP upon discharge  Baseline:  Goal status: ongoing   2.  Pt will improve LEFS score by 10 points  Baseline: 49/80 Goal status: ongoing   3.  Pt will be able to report only min heel pain with AM steps or sitting 30 min  Baseline: severe  Goal status: ongoing   4.  Pt will be able to walk in the community as needed with knee pain < 2/10 Baseline: update: increases with time , can still be mod Goal status: ongoing   5.  Pt will perform (single leg balance) single leg calf raise x 10 in preparation for running  Baseline: weaker in LLE and unable to get full lift, modified goal  0-20 sec SLS  Goal status: ongoing , modified goal     PLAN:  PT FREQUENCY: 2x/week  PT DURATION: 6 weeks  PLANNED INTERVENTIONS: 97164- PT Re-evaluation, 97110-Therapeutic exercises, 97530- Therapeutic activity, [id number]- Neuromuscular re-education, 97535- Self Care, 16109- Manual therapy, [id number]- Aquatic Therapy, Patient/Family education, Balance training, Taping, Dry Needling, Cryotherapy, and Moist heat  PLAN  FOR NEXT SESSION:finish POOL.  Hold until mid April cont strength in hips, foot/ankle stability , core and alternative ideas for gym ex for strength.  Aquatics week 2-3  Candon Caras, PT 07/28/2023, 3:09 PM   Karie Mainland, PT 07/28/23 3:09 PM Phone: [phone] Fax: [phone]

## 2023-07-29 ENCOUNTER — Encounter: Payer: Self-pay | Admitting: Neurology

## 2023-07-29 ENCOUNTER — Ambulatory Visit (INDEPENDENT_AMBULATORY_CARE_PROVIDER_SITE_OTHER): Payer: 59 | Admitting: Neurology

## 2023-07-29 DIAGNOSIS — G40009 Localization-related (focal) (partial) idiopathic epilepsy and epileptic syndromes with seizures of localized onset, not intractable, without status epilepticus: Secondary | ICD-10-CM | POA: Diagnosis not present

## 2023-07-29 MED ORDER — ZONISAMIDE 100 MG PO CAPS: ORAL_CAPSULE | ORAL | 3 refills | Status: DC

## 2023-07-29 MED ORDER — LAMOTRIGINE 100 MG PO TABS: ORAL_TABLET | ORAL | 3 refills | Status: DC

## 2023-07-29 NOTE — Patient Instructions (Signed)
Always a pleasure to see you. Continue all your medications. Follow-up in 6 months, call for any changes.    Seizure Precautions: 1. If medication has been prescribed for you to prevent seizures, take it exactly as directed.  Do not stop taking the medicine without talking to your doctor first, even if you have not had a seizure in a long time.   2. Avoid activities in which a seizure would cause danger to yourself or to others.  Don't operate dangerous machinery, swim alone, or climb in high or dangerous places, such as on ladders, roofs, or girders.  Do not drive unless your doctor says you may.  3. If you have any warning that you may have a seizure, lay down in a safe place where you can't hurt yourself.    4.  No driving for 6 months from last seizure, as per [facility].   Please refer to the following link on the [employer]'s website for more information: http://www.epilepsyfoundation.org/answerplace/Social/driving/drivingu.cfm   5.  Maintain good sleep hygiene. Avoid alcohol  6.  Contact your doctor if you have any problems that may be related to the medicine you are taking.  7.  Call 911 and bring the patient back to the ED if:        A.  The seizure lasts longer than 5 minutes.       B.  The patient doesn't awaken shortly after the seizure  C.  The patient has new problems such as difficulty seeing, speaking or moving  D.  The patient was injured during the seizure  E.  The patient has a temperature over 102 F (39C)  F.  The patient vomited and now is having trouble breathing

## 2023-07-29 NOTE — Progress Notes (Signed)
 NEUROLOGY FOLLOW UP OFFICE NOTE  Kristina Coleman [id number] [birth year]  HISTORY OF PRESENT ILLNESS: I had the pleasure of seeing Kristina Coleman in follow-up in the neurology clinic on 07/29/2023.  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 She is alone in the office today..  Records and images were personally reviewed where available.  She contacted our office about a seizure on 01/21/23. It was a nocturnal seizure and mild, she woke up feeling very out of it, sore and tired. It occurred after her period. Zonisamide  increased to 300mg  at bedtime. She is also on Lamotrigine  300mg  in AM, 250mg  in PM. She has noticed some nausea with the increase in Zonsiamide. She reports a headache right before her cycle, with dizziness. She gets 5-6 hours of sleep. She has been working out more.Mood is good.    History on Initial Assessment 03/13/2022: This is a pleasant 48 year old right-handed woman with a history of left temporal lobe epilepsy presenting to establish care. She has seen neurologists Dr. Tresia Fruit, Dr. Luster Salters, and since 2019 has been following at the [facility] with Dr. Mila Alexandria, last visit was 09/2020. Records were reviewed and will be summarized as follows. Seizures started at age 6. She thinks she may have had 1 or 2 seizures in her early teenage years when she had episodes in her sleep/dreams. At age 60, she started having nocturnal episodes where she would make a noise in her throat followed by generalized shaking, possible turn to the right. She would wake up feeling groggy, sore, with tongue bite or urinary incontinence. She would sleep for 2 days. They were occurring more around the time of her period. She had rare staring episodes where she may look to the right, sometimes partially responding, other times unresponsive. She denies any prior warning symptoms. One time her sister told her she just stopped talking, froze with one hand in the air, unresponsive for a few  seconds then she started talking again. Another time she paused and felt tired. Per notes, these would progress to bilateral arm jerking, foaming at the mouth and urinary incontinence. She reports 3 of these total, last was in 06/2017. She also has what she calls "mini-seizures" where she would get a very queasy feeling in her stomach, a headache with dizziness, cold sweats. She denies any associated confusion, loss of awareness, or speech difficulties. They would last a few minutes then she feels tired. She denies any olfactory/gustatory hallucinations, focal numbness/tingling/weakness, myoclonic jerks. She was initially started on Levetiracetam  which caused headaches and mood swings. She has been on Lamotrigine  300mg  in AM, 250mg  in PM and Zonisamide  200mg  qhs since at least 2021 and feels this regimen works for her, no side effects. She reports her last nocturnal seizure was in 2022. The episodes with queasy stomach feeling have occurred a few times this year, last was in October 2023 while she was at the [facility] and there was a lot of activity going on. She reports triggers are when she is doing too much (cannot do more than 1 thing at a time), may be her period, sleep deprivation, or if she does not eat regularly.   She denies any regular headaches outside of the seizures. Her vision gets blurry, no loss of vision. 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 She had a lumbar puncture with OP of  26 cm H2O. She will be seeing her eye doctor. No dysarthria/dysphagia, neck/back pain, bladder dysfunction. She has some constipation. She does not have good memory, forgetting conversations or anything prior to when she started having seizures. She lives with her girlfriend. She drives without getting lost. She left the stove on one time. She has to set an alarm for her medications. Mood is pretty good.  Sometimes she gets frustrated when she cannot work, she is on disability. Sleep is good. She had a sleep study in 2022 which was normal.   Epilepsy Risk Factors:  She has 3 maternal cousins with seizures. She had a normal birth and early development.  There is no history of febrile convulsions, CNS infections such as meningitis/encephalitis, significant traumatic brain injury, neurosurgical procedures.  Prior ASMs: Zonisamide  400mg  (unable to tolerate); Levetiracetam  (headaches, mood swings)  Diagnostic Data: EEGs: Routine EEG (05/13/17): normal awake and asleep VEEG at [facility] (4/13-4/14/20): rare left temporal slowing and sharp waves; one seizure with blinking, R hand automatisms, then secondary generalization from the left temporal lobe  MRI: MRI brain wo contrast at [facility] (05/13/17): no acute abnormality; hippocampi symmetric. 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 Constellation raises concern for idiopathic intracranial hypertension.   Lumbar Puncture at [facility] in 04/2017 showed opening pressure of 26 cm H2O. Fundoscopy at [facility] in 10/2017 was normal, no papilledema seen.  Sleep study 08/2020 normal  PAST MEDICAL HISTORY: Past Medical History:  Diagnosis Date   Anxiety    Arthritis    History of chicken pox    Idiopathic intracranial hypertension 12/27/2015   Lumbar Puncture: opening pressure 26cm of water. 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 Mila Alexandria ([facility])   Intracranial hypertension    Morbid obesity (HCC)    Recurrent seizures (HCC) 05/02/2018   Seizures (HCC) 11/2015   Tobacco  use 12/24/2016    MEDICATIONS: Current Outpatient Medications on File Prior to Visit  Medication Sig Dispense Refill   diclofenac  Sodium (VOLTAREN ) 1 % GEL Apply 4 g topically 4 (four) times daily. To affected joint. 100 g 11   lamoTRIgine  (LAMICTAL ) 100 MG tablet TAKE 3 TABLETS EVERY MORNING and TAKE 2 & 1/2 TABLETS AT NIGHT - ON THE WEEK BEFORE period, TAKE 3 TABLETS 2 TIMES DAILY FOR 7 DAYS 500 tablet 1   zonisamide  (ZONEGRAN ) 100 MG capsule TAKE 2 CAPSULES BY MOUTH EVERY EVENING 180 capsule 1   Cholecalciferol (VITAMIN D3) 125 MCG (5000 UT) CAPS Take 1 capsule (5,000 Units total) by mouth daily. (Patient not taking: Reported on 07/29/2023) 30 capsule 5   No current facility-administered medications on file prior to visit.    ALLERGIES: No Known Allergies  FAMILY HISTORY: Family History  Problem Relation Age of Onset   Diabetes Mother    Hypertension Mother    Arthritis Mother    Diabetes Father    Hypertension Father    Glaucoma Father    Cataracts Father    Cancer Sister        breast cancer   Breast cancer Sister 34   Colon cancer Neg Hx    Colon polyps Neg Hx  Esophageal cancer Neg Hx    Rectal cancer Neg Hx    Stomach cancer Neg Hx     SOCIAL HISTORY: Social History   Socioeconomic History   Marital status: Significant Other    Spouse name: Not on file   Number of children: 0   Years of education: college   Highest education level: Associate degree: occupational, Scientist, product/process development, or vocational program  Occupational History   Occupation: unemployed  Tobacco Use   Smoking status: Former    Current packs/day: 0.25    Types: Cigarettes   Smokeless tobacco: Never  Vaping Use   Vaping status: Never Used  Substance and Sexual Activity   Alcohol use: Not Currently    Comment: on occasion   Drug use: Yes    Frequency: 14.0 times per week    Types: Marijuana    Comment: every day, last time 03/12/23   Sexual activity: Yes    Birth control/protection: None   Other Topics Concern   Not on file  Social History Narrative   Occasionally drinks tea    Are you right handed or left handed? Right handed    Are you currently employed ?  Disability    What is your current occupation? NA   Do you live at home alone? No   Who lives with you? Lives with girl friend   What type of home do you live in: 1 story or 2 story? Has steps  spilt level home       Social Drivers of Health   Financial Resource Strain: Low Risk  (01/06/2023)   Overall Financial Resource Strain (CARDIA)    Difficulty of Paying Living Expenses: Not hard at all  Food Insecurity: Food Insecurity Present (01/06/2023)   Hunger Vital Sign    Worried About Running Out of Food in the Last Year: Sometimes true    Ran Out of Food in the Last Year: Never true  Transportation Needs: No Transportation Needs (01/06/2023)   PRAPARE - Administrator, Civil Service (Medical): No    Lack of Transportation (Non-Medical): No  Physical Activity: Insufficiently Active (01/06/2023)   Exercise Vital Sign    Days of Exercise per Week: 3 days    Minutes of Exercise per Session: 30 min  Stress: No Stress Concern Present (01/06/2023)   [employer] of Occupational Health - Occupational Stress Questionnaire    Feeling of Stress : Only a little  Social Connections: Moderately Isolated (01/06/2023)   Social Connection and Isolation Panel [NHANES]    Frequency of Communication with Friends and Family: Three times a week    Frequency of Social Gatherings with Friends and Family: Patient declined    Attends Religious Services: Never    Database administrator or Organizations: No    Attends Engineer, structural: Patient declined    Marital Status: Living with partner  Intimate Partner Violence: Not At Risk (01/07/2023)   Humiliation, Afraid, Rape, and Kick questionnaire    Fear of Current or Ex-Partner: No    Emotionally Abused: No    Physically Abused: No    Sexually Abused: No      PHYSICAL EXAM: Vitals:   07/29/23 1426  BP: 112/64  Pulse: 76  SpO2: 98%   General: No acute distress Head:  Normocephalic/atraumatic Skin/Extremities: No rash, no edema Neurological Exam: alert and awake. No aphasia or dysarthria. Fund of knowledge is appropriate. Attention and concentration are normal.   Cranial nerves: Pupils equal, round. Extraocular movements  intact with no nystagmus. Visual fields full.  No facial asymmetry.  Motor: Bulk and tone normal, muscle strength 5/5 throughout with no pronator drift.   Finger to nose testing intact.  Gait slow and cautious due to left knee and heel issues. No ataxia   IMPRESSION: This is a pleasant 48 yo RH woman with a history of left temporal lobe epilepsy. Her last seizure was in 12/2022, now on Zonisamide  300mg  at bedtime and Lamotrigine  100mg  3 tabs in AM, 2.5 tabs in PM. She would like to stay on this regimen. She is aware of [place] driving laws to stop driving after a seizure until 6 months seizure-free. Follow-up in 6 months, call for any changes.   Thank you for allowing me to participate in her care.  Please do not hesitate to call for any questions or concerns.    Rayfield Cairo, M.D.   CC: Versa Gore, NP

## 2023-08-01 ENCOUNTER — Ambulatory Visit: Payer: 59 | Attending: Orthopaedic Surgery

## 2023-08-01 DIAGNOSIS — M25562 Pain in left knee: Secondary | ICD-10-CM | POA: Insufficient documentation

## 2023-08-01 DIAGNOSIS — M25561 Pain in right knee: Secondary | ICD-10-CM | POA: Diagnosis not present

## 2023-08-01 DIAGNOSIS — R262 Difficulty in walking, not elsewhere classified: Secondary | ICD-10-CM | POA: Insufficient documentation

## 2023-08-01 DIAGNOSIS — M79672 Pain in left foot: Secondary | ICD-10-CM | POA: Insufficient documentation

## 2023-08-01 DIAGNOSIS — G8929 Other chronic pain: Secondary | ICD-10-CM | POA: Diagnosis not present

## 2023-08-01 NOTE — Therapy (Signed)
 OUTPATIENT PHYSICAL THERAPY LOWER EXTREMITY NOTE   Patient Name: Kristina Coleman MRN: [id number] DOB:09-12-1975, 48 y.o., female Today's Date: 08/01/2023  END OF SESSION:  PT End of Session - 08/01/23 1433     Visit Number 10    Number of Visits 12    Date for PT Re-Evaluation 08/04/23    Authorization Type [employer] MCR and MCD    PT Start Time 1433    PT Stop Time 1518    PT Time Calculation (min) 45 min    Activity Tolerance Patient tolerated treatment well    Behavior During Therapy WFL for tasks assessed/performed             Past Medical History:  Diagnosis Date   Anxiety    Arthritis    History of chicken pox    Idiopathic intracranial hypertension 12/27/2015   Lumbar Puncture: opening pressure 26cm of water. 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 Sherlean Foot ([facility])   Intracranial hypertension    Morbid obesity (HCC)    Recurrent seizures (HCC) 05/02/2018   Seizures (HCC) 11/2015   Tobacco use 12/24/2016   Past Surgical History:  Procedure Laterality Date   BREAST BIOPSY     CHOLECYSTECTOMY     left knee surgery     LUMBAR PUNCTURE     fluid removal in brain.    wisdom teeth removal     Patient Active Problem List   Diagnosis Date Noted   High risk medication use 06/04/2021   Elevated blood pressure reading 04/03/2021   Vitamin D deficiency 04/03/2021   Chronic pain of both knees 02/27/2021   Primary osteoarthritis of both knees 02/27/2021   Adjustment disorder with mixed anxiety and depressed mood 07/09/2017   Tobacco use 12/24/2016   Morbid obesity (HCC)    Partial idiopathic epilepsy with seizures of localized onset, not intractable, with status epilepticus (HCC) 11/28/2015    PCP: Andree Coss   REFERRING PROVIDER: Sanjuan Dame DIAG: [phone] (ICD-10-CM) - Chronic pain of left knee M79.672 (ICD-10-CM) - Pain of left  heel  THERAPY DIAG:  Chronic pain of both knees  Heel pain, chronic, left  Difficulty in walking, not elsewhere classified  Rationale for Evaluation and Treatment: Rehabilitation  ONSET DATE: a few yrs 2019. Seizures   SUBJECTIVE:   SUBJECTIVE STATEMENT: Patient reports some heel pain and minimal knee pain today. She states she felt good after last land session.   EVAL: Pt reports chronic knee pain, bilateral for several years.  She reports she began to have seizures several yes ago and that really set her back physically.  She was enrolled in the [facility] and [facility] and was working on nutrition, exercise and healthy lifestyle habit and she loves it.  She is limited at the gym with what she can do in her lower body and does not want to make it worse.  It started on the L knee  She has had to change her workout routine.  Pain is in both posterior knees. Her Rt knee gives out at times. She has difficulty walking, squatting, and esp.  lunging Pain in L heel is when she has a day of working out and after sitting. Pain in AM in L heel with the first steps she takes.  She has not had injections, no bracing or foot orthotics. She does have flat feet.   PERTINENT HISTORY: Seizures,  knee OA  PAIN:  Are you having pain? Yes: NPRS scale: 4-6/10 Pain location: Rt medial knee   Pain description: tightness , soreness  Aggravating factors: walking, steps , lunge, bending knee  Relieving factors: cream, ice pack, rolling her L foot out Wears [employer] to work out in, these are good.   Pain increases with barefoot.     PRECAUTIONS: None  RED FLAGS: None   WEIGHT BEARING RESTRICTIONS: No  FALLS:  Has patient fallen in last 6 months? No  LIVING ENVIRONMENT: Lives with: lives with their partner Lives in: House/apartment Stairs: Yes: Internal: 2 flights  steps; on right going up Has following equipment at home: None  OCCUPATION: not working   PLOF: Independent  PATIENT  GOALS: I want to be able to run someday.  Cont to workout. Yoga.   NEXT MD VISIT: See Dr. Roda Shutters in April   OBJECTIVE:  Note: Objective measures were completed at Evaluation unless otherwise noted.  DIAGNOSTIC FINDINGS: tricompartment osteoarthritis with mild  valgus alignment.   PATIENT SURVEYS:  LEFS 61%  COGNITION: Overall cognitive status: Within functional limits for tasks assessed     SENSATION: WFL  EDEMA:  Circumferential: NT   MUSCLE LENGTH: Hamstrings: Right 30 deg; Left 25 deg   POSTURE:  genu valgus, pes planus   PALPATION: Pain with palpation to posterior knee and medial Rt knee joint line   LOWER EXTREMITY ROM:  Active ROM Right eval Left eval  Hip flexion    Hip extension    Hip abduction    Hip adduction    Hip internal rotation    Hip external rotation    Knee flexion 120 110 pain   Knee extension -10 -8  Ankle dorsiflexion -5 -5  Ankle plantarflexion    Ankle inversion    Ankle eversion     (Blank rows = not tested)  LOWER EXTREMITY MMT:  MMT Right eval Left eval  Hip flexion 4 4  Hip extension NT NT   Hip abduction NT on eval NT on eval   Hip adduction    Hip internal rotation    Hip external rotation    Knee flexion 5 5  Knee extension 5 5  Ankle dorsiflexion 5 5  Ankle plantarflexion 4 4  Ankle inversion 4 4  Ankle eversion 4 4   (Blank rows = not tested)  LOWER EXTREMITY SPECIAL TESTS:  NT   FUNCTIONAL TESTS:  5 times sit to stand: 10.4 sec  2 minute walk test: NT   Single leg balance Rt 10 sec, L 20 sec   GAIT: Distance walked: 150+ Assistive device utilized: None Level of assistance: Complete Independence Comments: no deviations                                                                                                                                 TREATMENT DATE:  [facility] Adult PT Treatment:  DATE: 07/11/23 Aquatic therapy at [facility] [address] -  therapeutic pool temp approximately 90 degrees. Pt enters building ambulating independently. Treatment took place in water 3.8 to  4 ft 8 in. deep depending upon activity.  Pt entered and exited the pool via stair and handrails independently.  Therapeutic Exercise: Aquatic Exercise: Walking forward/backwards/side stepping x 2 laps ea Tandem stance 2x30" BIL Lunge stance pink bell shoulder abd/add, flex/ext, horizontal abd/add x10 ea BIL Tandem walking x1 lap - less frequent LOB today, able to self correct Standing with UE support edge of pool: Hip abd/add x20 BIL Squats 2x20 SL heel raises 2x10 BIL Calf stretch 2x30" BIL  Pt requires the buoyancy of water for active assisted exercises with buoyancy supported for strengthening and AROM exercises. Hydrostatic pressure also supports joints by unweighting joint load by at least 50 % in 3-4 feet depth water. 80% in chest to neck deep water. Water will provide assistance with movement using the current and laminar flow while the buoyancy reduces weight bearing. Pt requires the viscosity of the water for resistance with strengthening exercises.  [facility] Adult PT Treatment:                                                DATE: 07/28/23 Therapeutic Activity Elliptical 5 min L 1 resistance , 6 ramp  Hamstring ITB stretching/calf slant board  Calf raise from 1/2 foam roller double x 10 and then L single leg  Balance SLS on 1/2 foam  Tandem on 1/2 foam  SLS on the floor  Tennis ball rolling and pumping along foot  Manual Therapy: Heel, calf and achilles soft tissue   [facility] Adult PT Treatment:                                                DATE: 07/25/23 Aquatic therapy at [facility] [address] - therapeutic pool temp approximately 90 degrees. Pt enters building ambulating independently. Treatment took place in water 3.8 to  4 ft 8 in. deep depending upon activity.  Pt entered and exited the pool via stair and handrails  independently.  Therapeutic Exercise: Aquatic Exercise: Walking forward/backwards/side stepping x 2 laps ea Side stepping with yellow DB shoulder abd/add x2 laps Tandem stance 2x30" BIL Tandem walking x1 lap - frequent LOB, able to self correct Runners stretch on bottom step x30" BIL Hamstring stretch on bottom step x30" BIL Standing with UE support edge of pool: Hip abd/add x20 BIL Squats 2x20 Heel/toe raises 2x20 Calf stretch 2x30" BIL Hip ext/flex with knee straight x 20 BIL Hip Circles CW/CCW x10 each BIL   Pt requires the buoyancy of water for active assisted exercises with buoyancy supported for strengthening and AROM exercises. Hydrostatic pressure also supports joints by unweighting joint load by at least 50 % in 3-4 feet depth water. 80% in chest to neck deep water. Water will provide assistance with movement using the current and laminar flow while the buoyancy reduces weight bearing. Pt requires the viscosity of the water for resistance with strengthening exercises.    PATIENT EDUCATION:  Education details: see above  Person educated: Patient Education method: Explanation, Demonstration, and Verbal cues Education comprehension: verbalized understanding, returned demonstration, and needs further  education  HOME EXERCISE PROGRAM: Access Code: [email or web address] URL: [email or web address] Date: 07/03/2023 Prepared by: Karie Mainland  Exercises - Supine Bridge with Resistance Band  - 1 x daily - 7 x weekly - 2 sets - 10 reps - 5 hold - Clamshell with Resistance  - 1 x daily - 7 x weekly - 2 sets - 10 reps - 5 hold - Supine Hamstring Stretch with Strap  - 1 x daily - 7 x weekly - 1 sets - 3 reps - 30 hold - SLS SL heel raise  ASSESSMENT:  CLINICAL IMPRESSION: Patient presents to aquatic Pt session reporting some heel pain and minimal knee pain today. Session today focused on balance tasks and LE strengthening in the aquatic environment for use of buoyancy to  offload joints and the viscosity of water as resistance during therapeutic exercise. She had noticeable decrease in frequency of LOB today. Patient was able to tolerate all prescribed exercises in the aquatic environment with no adverse effects. Patient continues to benefit from skilled PT services on land and aquatic based and should be progressed as able to improve functional independence.   EVAL: Patient is a 48 y.o. female who was seen today for physical therapy evaluation and treatment for bilateral knee pain, L heel pain .   OBJECTIVE IMPAIRMENTS: decreased balance, decreased mobility, difficulty walking, decreased ROM, decreased strength, hypomobility, increased fascial restrictions, impaired flexibility, postural dysfunction, obesity, and pain.   ACTIVITY LIMITATIONS: standing, squatting, stairs, and locomotion level  PARTICIPATION LIMITATIONS: interpersonal relationship, community activity, and fitness  PERSONAL FACTORS: 1-2 comorbidities: seizures, knee OA   are also affecting patient's functional outcome.   REHAB POTENTIAL: Excellent  CLINICAL DECISION MAKING: Evolving/moderate complexity  EVALUATION COMPLEXITY: Moderate   GOALS: Goals reviewed with patient? Yes  SHORT TERM GOALS: Target date: 07/07/2023   Pt will be able to show I with HEP initial  Baseline: unknown Goal status: met   2.  Pt will be able to modify her gym routine to accommodate knee pain  Baseline:  Goal status: met    LONG TERM GOALS: Target date: 08/18/2023    Pt will be able to show I with final HEP upon discharge  Baseline:  Goal status: ongoing   2.  Pt will improve LEFS score by 10 points  Baseline: 49/80 Goal status: ongoing   3.  Pt will be able to report only min heel pain with AM steps or sitting 30 min  Baseline: severe  Goal status: ongoing   4.  Pt will be able to walk in the community as needed with knee pain < 2/10 Baseline: update: increases with time , can still be mod Goal  status: ongoing   5.  Pt will perform (single leg balance) single leg calf raise x 10 in preparation for running  Baseline: weaker in LLE and unable to get full lift, modified goal  0-20 sec SLS  Goal status: ongoing , modified goal     PLAN:  PT FREQUENCY: 2x/week  PT DURATION: 6 weeks  PLANNED INTERVENTIONS: [id number]- PT Re-evaluation, 97110-Therapeutic exercises, 97530- Therapeutic activity, [id number]- Neuromuscular re-education, 97535- Self Care, [id number]- Manual therapy, [id number]- Aquatic Therapy, Patient/Family education, Balance training, Taping, Dry Needling, Cryotherapy, and Moist heat  PLAN FOR NEXT SESSION:finish POOL.  Hold until mid April cont strength in hips, foot/ankle stability , core and alternative ideas for gym ex for strength.  Aquatics week 2-3    Berta Minor, PTA 08/01/2023, 3:16 PM

## 2023-08-12 NOTE — Therapy (Unsigned)
 OUTPATIENT PHYSICAL THERAPY LOWER EXTREMITY NOTE   Patient Name: Carolynne Schuchard MRN: [id number] DOB:08/15/1975, 48 y.o., female Today's Date: 08/12/2023  END OF SESSION:    Past Medical History:  Diagnosis Date   Anxiety    Arthritis    History of chicken pox    Idiopathic intracranial hypertension 12/27/2015   Lumbar Puncture: opening pressure 26cm of water. 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 Mila Alexandria ([facility])   Intracranial hypertension    Morbid obesity (HCC)    Recurrent seizures (HCC) 05/02/2018   Seizures (HCC) 11/2015   Tobacco use 12/24/2016   Past Surgical History:  Procedure Laterality Date   BREAST BIOPSY     CHOLECYSTECTOMY     left knee surgery     LUMBAR PUNCTURE     fluid removal in brain.    wisdom teeth removal     Patient Active Problem List   Diagnosis Date Noted   High risk medication use 06/04/2021   Elevated blood pressure reading 04/03/2021   Vitamin D deficiency 04/03/2021   Chronic pain of both knees 02/27/2021   Primary osteoarthritis of both knees 02/27/2021   Adjustment disorder with mixed anxiety and depressed mood 07/09/2017   Tobacco use 12/24/2016   Morbid obesity (HCC)    Partial idiopathic epilepsy with seizures of localized onset, not intractable, with status epilepticus (HCC) 11/28/2015    PCP: Alray Askew   REFERRING PROVIDER: Remo Carls DIAG: [phone] (ICD-10-CM) - Chronic pain of left knee M79.672 (ICD-10-CM) - Pain of left heel  THERAPY DIAG:  No diagnosis found.  Rationale for Evaluation and Treatment: Rehabilitation  ONSET DATE: a few yrs 2019. Seizures   SUBJECTIVE:   SUBJECTIVE STATEMENT: *** DISCHARGE? Patient reports some heel pain and minimal knee pain today. She states she felt good after last land session.   EVAL: Pt reports chronic knee pain, bilateral for several years.  She  reports she began to have seizures several yes ago and that really set her back physically.  She was [facility] in [facility] and the [facility] and was working on nutrition, exercise and healthy lifestyle habit and she loves it.  She is limited at the gym with what she can do in her lower body and does not want to make it worse.  It started on the L knee  She has had to change her workout routine.  Pain is in both posterior knees. Her Rt knee gives out at times. She has difficulty walking, squatting, and esp.  lunging Pain in L heel is when she has a day of working out and after sitting. Pain in AM in L heel with the first steps she takes.  She has not had injections, no bracing or foot orthotics. She does have flat feet.   PERTINENT HISTORY: Seizures, knee OA  PAIN:  Are you having pain? Yes: NPRS scale: 4-6/10 Pain location: Rt medial knee   Pain description: tightness , soreness  Aggravating factors: walking, steps , lunge, bending knee  Relieving factors: cream, ice pack, rolling her L foot out Wears [employer] to work out in, these are good.   Pain increases with barefoot.     PRECAUTIONS: None  RED FLAGS: None   WEIGHT BEARING RESTRICTIONS: No  FALLS:  Has patient fallen in last 6 months? No  LIVING ENVIRONMENT: Lives with: lives with their partner Lives in: House/apartment Stairs: Yes: Internal:  2 flights  steps; on right going up Has following equipment at home: None  OCCUPATION: not working   PLOF: Independent  PATIENT GOALS: I want to be able to run someday.  Cont to workout. Yoga.   NEXT MD VISIT: See Dr. Roda Shutters in April   OBJECTIVE:  Note: Objective measures were completed at Evaluation unless otherwise noted.  DIAGNOSTIC FINDINGS: tricompartment osteoarthritis with mild  valgus alignment.   PATIENT SURVEYS:  LEFS 61%  COGNITION: Overall cognitive status: Within functional limits for tasks assessed     SENSATION: WFL  EDEMA:  Circumferential: NT    MUSCLE LENGTH: Hamstrings: Right 30 deg; Left 25 deg   POSTURE:  genu valgus, pes planus   PALPATION: Pain with palpation to posterior knee and medial Rt knee joint line   LOWER EXTREMITY ROM:  Active ROM Right eval Left eval  Hip flexion    Hip extension    Hip abduction    Hip adduction    Hip internal rotation    Hip external rotation    Knee flexion 120 110 pain   Knee extension -10 -8  Ankle dorsiflexion -5 -5  Ankle plantarflexion    Ankle inversion    Ankle eversion     (Blank rows = not tested)  LOWER EXTREMITY MMT:  MMT Right eval Left eval  Hip flexion 4 4  Hip extension NT NT   Hip abduction NT on eval NT on eval   Hip adduction    Hip internal rotation    Hip external rotation    Knee flexion 5 5  Knee extension 5 5  Ankle dorsiflexion 5 5  Ankle plantarflexion 4 4  Ankle inversion 4 4  Ankle eversion 4 4   (Blank rows = not tested)  LOWER EXTREMITY SPECIAL TESTS:  NT   FUNCTIONAL TESTS:  5 times sit to stand: 10.4 sec  2 minute walk test: NT   Single leg balance Rt 10 sec, L 20 sec   GAIT: Distance walked: 150+ Assistive device utilized: None Level of assistance: Complete Independence Comments: no deviations                                                                                                                                 TREATMENT DATE:    [facility] Adult PT Treatment:                                                DATE: 08/13/23 Therapeutic Exercise: *** Manual Therapy: *** Neuromuscular re-ed: *** Therapeutic Activity: *** Modalities: *** Self Care: Marlane Mingle Adult PT Treatment:  DATE: 07/11/23 Aquatic therapy at [facility] [address] - therapeutic pool temp approximately 90 degrees. Pt enters building ambulating independently. Treatment took place in water 3.8 to  4 ft 8 in. deep depending upon activity.  Pt entered and exited the pool via stair and  handrails independently.  Therapeutic Exercise: Aquatic Exercise: Walking forward/backwards/side stepping x 2 laps ea Tandem stance 2x30" BIL Lunge stance pink bell shoulder abd/add, flex/ext, horizontal abd/add x10 ea BIL Tandem walking x1 lap - less frequent LOB today, able to self correct Standing with UE support edge of pool: Hip abd/add x20 BIL Squats 2x20 SL heel raises 2x10 BIL Calf stretch 2x30" BIL  Pt requires the buoyancy of water for active assisted exercises with buoyancy supported for strengthening and AROM exercises. Hydrostatic pressure also supports joints by unweighting joint load by at least 50 % in 3-4 feet depth water. 80% in chest to neck deep water. Water will provide assistance with movement using the current and laminar flow while the buoyancy reduces weight bearing. Pt requires the viscosity of the water for resistance with strengthening exercises.  [facility] Adult PT Treatment:                                                DATE: 07/28/23 Therapeutic Activity Elliptical 5 min L 1 resistance , 6 ramp  Hamstring ITB stretching/calf slant board  Calf raise from 1/2 foam roller double x 10 and then L single leg  Balance SLS on 1/2 foam  Tandem on 1/2 foam  SLS on the floor  Tennis ball rolling and pumping along foot  Manual Therapy: Heel, calf and achilles soft tissue   [facility] Adult PT Treatment:                                                DATE: 07/25/23 Aquatic therapy at [facility] [address] - therapeutic pool temp approximately 90 degrees. Pt enters building ambulating independently. Treatment took place in water 3.8 to  4 ft 8 in. deep depending upon activity.  Pt entered and exited the pool via stair and handrails independently.  Therapeutic Exercise: Aquatic Exercise: Walking forward/backwards/side stepping x 2 laps ea Side stepping with yellow DB shoulder abd/add x2 laps Tandem stance 2x30" BIL Tandem walking x1 lap - frequent LOB, able to self  correct Runners stretch on bottom step x30" BIL Hamstring stretch on bottom step x30" BIL Standing with UE support edge of pool: Hip abd/add x20 BIL Squats 2x20 Heel/toe raises 2x20 Calf stretch 2x30" BIL Hip ext/flex with knee straight x 20 BIL Hip Circles CW/CCW x10 each BIL   Pt requires the buoyancy of water for active assisted exercises with buoyancy supported for strengthening and AROM exercises. Hydrostatic pressure also supports joints by unweighting joint load by at least 50 % in 3-4 feet depth water. 80% in chest to neck deep water. Water will provide assistance with movement using the current and laminar flow while the buoyancy reduces weight bearing. Pt requires the viscosity of the water for resistance with strengthening exercises.    PATIENT EDUCATION:  Education details: see above  Person educated: Patient Education method: Explanation, Demonstration, and Verbal cues Education comprehension: verbalized understanding, returned demonstration, and needs further  education  HOME EXERCISE PROGRAM: Access Code: [email or web address] URL: [email or web address] Date: 07/03/2023 Prepared by: Karie Mainland  Exercises - Supine Bridge with Resistance Band  - 1 x daily - 7 x weekly - 2 sets - 10 reps - 5 hold - Clamshell with Resistance  - 1 x daily - 7 x weekly - 2 sets - 10 reps - 5 hold - Supine Hamstring Stretch with Strap  - 1 x daily - 7 x weekly - 1 sets - 3 reps - 30 hold - SLS SL heel raise  ASSESSMENT:  CLINICAL IMPRESSION: Patient presents to aquatic Pt session reporting some heel pain and minimal knee pain today. Session today focused on balance tasks and LE strengthening in the aquatic environment for use of buoyancy to offload joints and the viscosity of water as resistance during therapeutic exercise. She had noticeable decrease in frequency of LOB today. Patient was able to tolerate all prescribed exercises in the aquatic environment with no adverse effects.  Patient continues to benefit from skilled PT services on land and aquatic based and should be progressed as able to improve functional independence.   EVAL: Patient is a 48 y.o. female who was seen today for physical therapy evaluation and treatment for bilateral knee pain, L heel pain .   OBJECTIVE IMPAIRMENTS: decreased balance, decreased mobility, difficulty walking, decreased ROM, decreased strength, hypomobility, increased fascial restrictions, impaired flexibility, postural dysfunction, obesity, and pain.   ACTIVITY LIMITATIONS: standing, squatting, stairs, and locomotion level  PARTICIPATION LIMITATIONS: interpersonal relationship, community activity, and fitness  PERSONAL FACTORS: 1-2 comorbidities: seizures, knee OA   are also affecting patient's functional outcome.   REHAB POTENTIAL: Excellent  CLINICAL DECISION MAKING: Evolving/moderate complexity  EVALUATION COMPLEXITY: Moderate   GOALS: Goals reviewed with patient? Yes  SHORT TERM GOALS: Target date: 07/07/2023   Pt will be able to show I with HEP initial  Baseline: unknown Goal status: met   2.  Pt will be able to modify her gym routine to accommodate knee pain  Baseline:  Goal status: met    LONG TERM GOALS: Target date: 08/18/2023    Pt will be able to show I with final HEP upon discharge  Baseline:  Goal status: ongoing   2.  Pt will improve LEFS score by 10 points  Baseline: 49/80 Goal status: ongoing   3.  Pt will be able to report only min heel pain with AM steps or sitting 30 min  Baseline: severe  Goal status: ongoing   4.  Pt will be able to walk in the community as needed with knee pain < 2/10 Baseline: update: increases with time , can still be mod Goal status: ongoing   5.  Pt will perform (single leg balance) single leg calf raise x 10 in preparation for running  Baseline: weaker in LLE and unable to get full lift, modified goal  0-20 sec SLS  Goal status: ongoing , modified goal      PLAN:  PT FREQUENCY: 2x/week  PT DURATION: 6 weeks  PLANNED INTERVENTIONS: 97164- PT Re-evaluation, 97110-Therapeutic exercises, 97530- Therapeutic activity, [id number]- Neuromuscular re-education, 97535- Self Care, [id number]- Manual therapy, [id number]- Aquatic Therapy, Patient/Family education, Balance training, Taping, Dry Needling, Cryotherapy, and Moist heat  PLAN FOR NEXT SESSION:finish POOL.  Hold until mid April cont strength in hips, foot/ankle stability , core and alternative ideas for gym ex for strength.  Aquatics week 2-3    Libia Fazzini, PT 08/12/2023, 12:42 PM

## 2023-08-13 ENCOUNTER — Ambulatory Visit: Payer: Self-pay | Admitting: Physical Therapy

## 2023-08-13 ENCOUNTER — Encounter: Payer: Self-pay | Admitting: Physical Therapy

## 2023-08-13 DIAGNOSIS — M25561 Pain in right knee: Secondary | ICD-10-CM | POA: Diagnosis not present

## 2023-08-13 DIAGNOSIS — M79672 Pain in left foot: Secondary | ICD-10-CM | POA: Diagnosis not present

## 2023-08-13 DIAGNOSIS — G8929 Other chronic pain: Secondary | ICD-10-CM | POA: Diagnosis not present

## 2023-08-13 DIAGNOSIS — R262 Difficulty in walking, not elsewhere classified: Secondary | ICD-10-CM | POA: Diagnosis not present

## 2023-08-13 DIAGNOSIS — M25562 Pain in left knee: Secondary | ICD-10-CM | POA: Diagnosis not present

## 2023-09-14 ENCOUNTER — Emergency Department (HOSPITAL_COMMUNITY)

## 2023-09-14 ENCOUNTER — Other Ambulatory Visit: Payer: Self-pay

## 2023-09-14 ENCOUNTER — Encounter (HOSPITAL_COMMUNITY): Payer: Self-pay

## 2023-09-14 ENCOUNTER — Emergency Department (HOSPITAL_COMMUNITY)
Admission: EM | Admit: 2023-09-14 | Discharge: 2023-09-14 | Disposition: A | Discharge status: Home / Self Care | Attending: Emergency Medicine | Admitting: Emergency Medicine

## 2023-09-14 DIAGNOSIS — I10 Essential (primary) hypertension: Secondary | ICD-10-CM | POA: Diagnosis not present

## 2023-09-14 DIAGNOSIS — M25531 Pain in right wrist: Secondary | ICD-10-CM | POA: Diagnosis not present

## 2023-09-14 DIAGNOSIS — S66811A Strain of other specified muscles, fascia and tendons at wrist and hand level, right hand, initial encounter: Secondary | ICD-10-CM | POA: Diagnosis not present

## 2023-09-14 MED ORDER — ACETAMINOPHEN 500 MG PO TABS
1000.0000 mg | ORAL_TABLET | Freq: Once | ORAL | Status: AC
  Administered 2023-09-14: 1000 mg via ORAL
  Filled 2023-09-14: qty 2

## 2023-09-14 NOTE — Progress Notes (Signed)
 Orthopedic Tech Progress Note Patient Details:  Kristina Coleman [birth year] [id number]  Ortho Devices Type of Ortho Device: Velcro wrist splint Ortho Device/Splint Location: RUE Ortho Device/Splint Interventions: Ordered, Application, Adjustment   Post Interventions Patient Tolerated: Well Instructions Provided: Adjustment of device, Care of device  Herbie Loll 09/14/2023, 12:52 PM

## 2023-09-14 NOTE — Discharge Instructions (Addendum)
 Xray FINDINGS: Accessory ossicle or remote fracture fragment of the ulnar styloid. No acute fracture or dislocation. Scaphoid intact.  Please follow up with the hand/ortho provider.   Alternating between 650 mg Tylenol  and 400 mg Advil: The best way to alternate taking Acetaminophen  (example Tylenol ) and Ibuprofen (example Advil/Motrin) is to take them 3 hours apart. For example, if you take ibuprofen at 6 am you can then take Tylenol  at 9 am. You can continue this regimen throughout the day, making sure you do not exceed the recommended maximum dose for each drug.

## 2023-09-14 NOTE — ED Triage Notes (Signed)
 Pt c/o right wrist pain for the past couple of days. Pt started a new pilates class recently and not sure if that is why it is hurting.

## 2023-09-14 NOTE — ED Notes (Signed)
 Patient is upset that she is in a hall room. Patient requesting a room. Charge nurse aware and takes patient to fast track.

## 2023-09-14 NOTE — ED Provider Notes (Signed)
 Kristina Coleman EMERGENCY DEPARTMENT AT [facility] Provider Note   CSN: [id number] Arrival date & time: 09/14/23  1106     History  Chief Complaint  Patient presents with   Wrist Pain    Dorismar Chay is a 48 y.o. female with PMHx seizures, anxiety, OA, HTN who presents to ED concerned for right wrist pain developing over the past couple of days. Patient denies any specific trauma, but states that she does piliates with abundance of planks and downward dog. Patient has not taken any medication for pain recently. Denies fever, nausea, vomiting, diarrhea. Pain is along ulnar wrist area.    Wrist Pain       Home Medications Prior to Admission medications   Medication Sig Start Date End Date Taking? Authorizing Provider  Cholecalciferol (VITAMIN D3) 125 MCG (5000 UT) CAPS Take 1 capsule (5,000 Units total) by mouth daily. Patient not taking: Reported on 07/29/2023 02/14/22   Versa Gore, NP  diclofenac  Sodium (VOLTAREN ) 1 % GEL Apply 4 g topically 4 (four) times daily. To affected joint. 02/27/21   Corey, Evan S, MD  lamoTRIgine  (LAMICTAL ) 100 MG tablet TAKE 3 TABLETS EVERY MORNING and TAKE 2 & 1/2 TABLETS AT NIGHT 07/29/23   Jhonny Moss, MD  zonisamide  (ZONEGRAN ) 100 MG capsule TAKE 2 CAPSULES BY MOUTH EVERY EVENING 07/29/23   Jhonny Moss, MD      Allergies    Patient has no known allergies.    Review of Systems   Review of Systems  Musculoskeletal:        Wrist pain    Physical Exam Updated Vital Signs BP (!) 147/99   Pulse 77   Temp 98.2 F (36.8 C) (Oral)   Resp 18   Ht 5\' 6"  (1.676 m)   Wt 113.4 kg   LMP 09/11/2023   SpO2 99%   BMI 40.35 kg/m  Physical Exam Vitals and nursing note reviewed.  Constitutional:      General: She is not in acute distress.    Appearance: She is not ill-appearing or toxic-appearing.  HENT:     Head: Normocephalic and atraumatic.  Eyes:     General: No scleral icterus.       Right eye: No discharge.         Left eye: No discharge.     Conjunctiva/sclera: Conjunctivae normal.  Cardiovascular:     Rate and Rhythm: Normal rate.  Pulmonary:     Effort: Pulmonary effort is normal.  Abdominal:     General: Abdomen is flat.  Musculoskeletal:     Comments: +2 radial pulse. Sensation to light touch intact. Brisk capillary refill. ROM intact. No swelling, erythema, or increased warmth.  Skin:    General: Skin is warm and dry.  Neurological:     General: No focal deficit present.     Mental Status: She is alert. Mental status is at baseline.  Psychiatric:        Mood and Affect: Mood normal.        Behavior: Behavior normal.     ED Results / Procedures / Treatments   Labs (all labs ordered are listed, but only abnormal results are displayed) Labs Reviewed - No data to display  EKG None  Radiology DG Wrist Complete Right Result Date: 09/14/2023 CLINICAL DATA:  Pain without trauma EXAM: RIGHT WRIST - COMPLETE 3+ VIEW COMPARISON:  None Available. FINDINGS: Accessory ossicle or remote fracture fragment of the ulnar styloid. No acute fracture or dislocation. Scaphoid  intact. IMPRESSION: No acute osseous abnormality. Electronically Signed   By: Lore Rode M.D.   On: 09/14/2023 11:41    Procedures Procedures    Medications Ordered in ED Medications  acetaminophen  (TYLENOL ) tablet 1,000 mg (has no administration in time range)    ED Course/ Medical Decision Making/ A&P                                 Medical Decision Making Amount and/or Complexity of Data Reviewed Radiology: ordered.  Risk OTC drugs.   This patient presents to the ED for concern of wrist pain, this involves an extensive number of treatment options, and is a complaint that carries with it a high risk of complications and morbidity.  The differential diagnosis includes hemarthrosis, gout, septic joint, fracture, tendonitis, carpal tunnel syndrome, muscle strain, bursitis, compartment syndrome   Co morbidities  that complicate the patient evaluation  None   Additional history obtained:  Dr. Alray Askew PCP  Problem List / ED Course / Critical interventions / Medication management  Patient presents ED concern for right wrist pain.  Physical exam reassuring.  Patient afebrile with stable vitals. I ordered imaging studies including wrist x-ray. I independently visualized and interpreted imaging. I agree with the radiologist interpretation of accessory ossicle or remote fracture fragment of the ulnar styloid.  No acute fracture or dislocation.  Scaphoid intact.  Shared results with patient.  Answered all questions.  Will provide patient with a wrist splint and have her follow-up with hand/Ortho.  Educated patient on alternating Advil and Tylenol  for pain.  Patient verbalized understanding of plan. I have reviewed the patients home medicines and have made adjustments as needed The patient has been appropriately medically screened and/or stabilized in the ED. I have low suspicion for any other emergent medical condition which would require further screening, evaluation or treatment in the ED or require inpatient management. At time of discharge the patient is hemodynamically stable and in no acute distress. I have discussed work-up results and diagnosis with patient and answered all questions. Patient is agreeable with discharge plan. We discussed strict return precautions for returning to the emergency department and they verbalized understanding.     Social Determinants of Health:  none         Final Clinical Impression(s) / ED Diagnoses Final diagnoses:  Right wrist pain    Rx / DC Orders ED Discharge Orders     None         [place], [place] 09/14/23 1236    Flonnie Humphrey, DO 09/14/23 1600

## 2023-09-15 ENCOUNTER — Encounter: Payer: Self-pay | Admitting: Family

## 2023-09-17 ENCOUNTER — Other Ambulatory Visit: Payer: Self-pay | Admitting: Family

## 2023-09-17 DIAGNOSIS — S52614A Nondisplaced fracture of right ulna styloid process, initial encounter for closed fracture: Secondary | ICD-10-CM

## 2023-09-23 DIAGNOSIS — M25532 Pain in left wrist: Secondary | ICD-10-CM | POA: Diagnosis not present

## 2023-09-23 DIAGNOSIS — M25531 Pain in right wrist: Secondary | ICD-10-CM | POA: Diagnosis not present
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
## 2023-12-01 ENCOUNTER — Other Ambulatory Visit: Payer: Self-pay | Admitting: Neurology

## 2023-12-01 DIAGNOSIS — G40009 Localization-related (focal) (partial) idiopathic epilepsy and epileptic syndromes with seizures of localized onset, not intractable, without status epilepticus: Secondary | ICD-10-CM

## 2023-12-09 ENCOUNTER — Encounter: Payer: Self-pay | Admitting: Family

## 2023-12-15 NOTE — Telephone Encounter (Signed)
 I have no recommendations, whomever can get her in soonest and can be with an optometrist, does not have to be an ophthalmologist. Kristina Coleman

## 2024-01-15 ENCOUNTER — Ambulatory Visit (INDEPENDENT_AMBULATORY_CARE_PROVIDER_SITE_OTHER): Payer: 59

## 2024-01-15 VITALS — Ht 66.0 in | Wt 250.0 lb

## 2024-01-15 DIAGNOSIS — Z0000 Encounter for general adult medical examination without abnormal findings: Secondary | ICD-10-CM

## 2024-01-15 DIAGNOSIS — Z719 Counseling, unspecified: Secondary | ICD-10-CM

## 2024-01-15 NOTE — Progress Notes (Signed)
 Subjective:   Kristina Coleman is a 48 y.o. who presents for a Medicare Wellness preventive visit.  As a reminder, Annual Wellness Visits don't include a physical exam, and some assessments may be limited, especially if this visit is performed virtually. We may recommend an in-person follow-up visit with your provider if needed.  Visit Complete: Virtual I connected with  Kristina Coleman on 01/15/24 by a audio enabled telemedicine application and verified that I am speaking with the correct person using two identifiers.  Patient Location: Home  Provider Location: Office/Clinic  I discussed the limitations of evaluation and management by telemedicine. The patient expressed understanding and agreed to proceed.  Vital Signs: Because this visit was a virtual/telehealth visit, some criteria may be missing or patient reported. Any vitals not documented were not able to be obtained and vitals that have been documented are patient reported.  VideoDeclined- This patient declined Librarian, academic. Therefore the visit was completed with audio only.  Persons Participating in Visit: Patient.  AWV Questionnaire: Yes: Patient Medicare AWV questionnaire was completed by the patient on 01/14/24; I have confirmed that all information answered by patient is correct and no changes since this date.  Cardiac Risk Factors include: obesity (BMI >30kg/m2)     Objective:    Today's Vitals   01/15/24 0947  Weight: 250 lb (113.4 kg)  Height: 5' 6 (1.676 m)   Body mass index is 40.35 kg/m.     01/15/2024    9:56 AM 07/29/2023    2:33 PM 06/23/2023   10:23 AM 01/07/2023   10:39 AM 12/27/2022    2:03 PM 05/29/2022    3:44 PM 03/13/2022    8:56 AM  Advanced Directives  Does Patient Have a Medical Advance Directive? No No No No No No No  Would patient like information on creating a medical advance directive? No - Patient declined  No - Patient declined No - Patient declined        Current Medications (verified) Outpatient Encounter Medications as of 01/15/2024  Medication Sig   diclofenac  Sodium (VOLTAREN ) 1 % GEL Apply 4 g topically 4 (four) times daily. To affected joint.   lamoTRIgine  (LAMICTAL ) 100 MG tablet TAKE 3 TABLETS EVERY MORNING and TAKE 2 & 1/2 TABLETS AT NIGHT   zonisamide  (ZONEGRAN ) 100 MG capsule TAKE 2 CAPSULES BY MOUTH EVERY EVENING   [DISCONTINUED] Cholecalciferol (VITAMIN D3) 125 MCG (5000 UT) CAPS Take 1 capsule (5,000 Units total) by mouth daily. (Patient not taking: Reported on 07/29/2023)   No facility-administered encounter medications on file as of 01/15/2024.    Allergies (verified) Patient has no known allergies.   History: Past Medical History:  Diagnosis Date   Anxiety    Arthritis    History of chicken pox    Idiopathic intracranial hypertension 12/27/2015   Lumbar Puncture: opening pressure 26cm of water. 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 Evelia ([facility])   Intracranial hypertension    Morbid obesity (HCC)    Recurrent seizures (HCC) 05/02/2018   Seizures (HCC) 11/2015   Tobacco use 12/24/2016   Past Surgical History:  Procedure Laterality Date   BREAST BIOPSY     CHOLECYSTECTOMY     left knee surgery     LUMBAR PUNCTURE     fluid removal in brain.    wisdom teeth removal     Family History  Problem Relation  Age of Onset   Diabetes Mother    Hypertension Mother    Arthritis Mother    Diabetes Father    Hypertension Father    Glaucoma Father    Cataracts Father    Cancer Sister        breast cancer   Breast cancer Sister 86   Colon cancer Neg Hx    Colon polyps Neg Hx    Esophageal cancer Neg Hx    Rectal cancer Neg Hx    Stomach cancer Neg Hx    Social History   Socioeconomic History   Marital status: Significant Other    Spouse name: Not on file   Number of children: 0    Years of education: college   Highest education level: Associate degree: occupational, Scientist, product/process development, or vocational program  Occupational History   Occupation: unemployed  Tobacco Use   Smoking status: Former    Current packs/day: 0.25    Types: Cigarettes   Smokeless tobacco: Never  Vaping Use   Vaping status: Never Used  Substance and Sexual Activity   Alcohol use: Not Currently    Comment: on occasion   Drug use: Yes    Frequency: 14.0 times per week    Types: Marijuana    Comment: every day, last time 03/12/23   Sexual activity: Yes    Birth control/protection: None  Other Topics Concern   Not on file  Social History Narrative   Occasionally drinks tea    Are you right handed or left handed? Right handed    Are you currently employed ?  Disability    What is your current occupation? NA   Do you live at home alone? No   Who lives with you? Lives with girl friend   What type of home do you live in: 1 story or 2 story? Has steps  spilt level home       Social Drivers of Health   Financial Resource Strain: Medium Risk (01/14/2024)   Overall Financial Resource Strain (CARDIA)    Difficulty of Paying Living Expenses: Somewhat hard  Food Insecurity: Food Insecurity Present (01/14/2024)   Hunger Vital Sign    Worried About Running Out of Food in the Last Year: Never true    Ran Out of Food in the Last Year: Sometimes true  Transportation Needs: No Transportation Needs (01/14/2024)   PRAPARE - Administrator, Civil Service (Medical): No    Lack of Transportation (Non-Medical): No  Physical Activity: Sufficiently Active (01/14/2024)   Exercise Vital Sign    Days of Exercise per Week: 5 days    Minutes of Exercise per Session: 30 min  Stress: No Stress Concern Present (01/14/2024)   [employer] of Occupational Health - Occupational Stress Questionnaire    Feeling of Stress: Only a little  Social Connections: Moderately Isolated (01/14/2024)   Social Connection and  Isolation Panel    Frequency of Communication with Friends and Family: Three times a week    Frequency of Social Gatherings with Friends and Family: Patient declined    Attends Religious Services: Patient declined    Database administrator or Organizations: No    Attends Engineer, structural: Not on file    Marital Status: Living with partner    Tobacco Counseling Counseling given: Not Answered    Clinical Intake:  Pre-visit preparation completed: Yes  Pain : No/denies pain     BMI - recorded: 40.35 Nutritional Status: BMI > 30  Obese Diabetes: No  Lab Results  Component Value Date   HGBA1C 4.6 (L) 05/03/2018   HGBA1C 5.1 10/18/2016     How often do you need to have someone help you when you read instructions, pamphlets, or other written materials from your doctor or pharmacy?: 1 - Never  Interpreter Needed?: No  Information entered by :: Ellouise Haws, LPN   Activities of Daily Living     01/14/2024   10:37 AM  In your present state of health, do you have any difficulty performing the following activities:  Hearing? 0  Vision? 1  Difficulty concentrating or making decisions? 1  Comment due to seizures  Walking or climbing stairs? 0  Dressing or bathing? 0  Doing errands, shopping? 1  Preparing Food and eating ? N  Using the Toilet? N  In the past six months, have you accidently leaked urine? Y  Comment at times  Do you have problems with loss of bowel control? N  Managing your Medications? N  Managing your Finances? N  Housekeeping or managing your Housekeeping? Y    Patient Care Team: Lucius Krabbe, NP as PCP - General (Family Medicine) Georjean Darice HERO, MD as Consulting Physician (Neurology)  I have updated your Care Teams any recent Medical Services you may have received from other providers in the past year.     Assessment:   This is a routine wellness examination for Shima.  Hearing/Vision screen Hearing Screening - Comments::  Pt denies any hearing issues  Vision Screening - Comments:: [facility]    Goals Addressed             This Visit's Progress    Patient Stated       Be as active as I can        Depression Screen     01/15/2024    9:51 AM 01/07/2023   10:36 AM 12/26/2022    9:28 AM 01/11/2022    2:41 PM 06/04/2021   11:18 AM 04/03/2021    9:58 AM 12/15/2017   11:01 AM  PHQ 2/9 Scores  PHQ - 2 Score 2 2 2  0 0 0 5  PHQ- 9 Score 5 5 6    15     Fall Risk     01/14/2024   10:37 AM 07/29/2023    2:33 PM 01/06/2023    4:11 PM 12/27/2022    2:03 PM 05/29/2022    3:43 PM  Fall Risk   Falls in the past year? 0 0 0 0 0  Number falls in past yr: 0 0 0 0 0  Injury with Fall? 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    Follow up Falls prevention discussed Falls evaluation completed Falls prevention discussed Falls evaluation completed Falls evaluation completed    MEDICARE RISK AT HOME:  Medicare Risk at Home Any stairs in or around the home?: No If so, are there any without handrails?: (Patient-Rptd) No Home free of loose throw rugs in walkways, pet beds, electrical cords, etc?: (Patient-Rptd) Yes Adequate lighting in your home to reduce risk of falls?: (Patient-Rptd) Yes Life alert?: (Patient-Rptd) No Use of a cane, walker or w/c?: (Patient-Rptd) No Grab bars in the bathroom?: (Patient-Rptd) No Shower chair or bench in shower?: (Patient-Rptd) No Elevated toilet seat or a handicapped toilet?: (  Patient-Rptd) No  TIMED UP AND GO:  Was the test performed?  No  Cognitive Function: 6CIT completed        01/15/2024    9:56 AM 01/07/2023   10:40 AM 01/11/2022    2:47 PM  6CIT Screen  What Year? 0 points 0 points 0 points  What month? 0 points 0 points 0 points  What time? 0 points 0 points 0 points  Count back from 20 0 points 0 points 0 points  Months in  reverse 0 points 0 points 0 points  Repeat phrase 2 points 6 points 4 points  Total Score 2 points 6 points 4 points    Immunizations  There is no immunization history on file for this patient.  Screening Tests Health Maintenance  Topic Date Due   Hepatitis C Screening  Never done   DTaP/Tdap/Td (1 - Tdap) Never done   Hepatitis B Vaccines 19-59 Average Risk (1 of 3 - 19+ 3-dose series) Never done   Medicare Annual Wellness (AWV)  01/14/2025   Mammogram  05/01/2025   Colonoscopy  03/13/2026   Cervical Cancer Screening (HPV/Pap Cotest)  12/26/2027   HIV Screening  Completed   Pneumococcal Vaccine  Aged Out   HPV VACCINES  Aged Out   Meningococcal B Vaccine  Aged Out   Influenza Vaccine  Discontinued   COVID-19 Vaccine  Discontinued    Health Maintenance Items Addressed: See Nurse Notes at the end of this note  Additional Screening:  Vision Screening: Recommended annual ophthalmology exams for early detection of glaucoma and other disorders of the eye. Is the patient up to date with their annual eye exam?  No  Who is the provider or what is the name of the office in which the patient attends annual eye exams? Pt will follow up for new pt appt with eye provider   Dental Screening: Recommended annual dental exams for proper oral hygiene  Community Resource Referral / Chronic Care Management: CRR required this visit?  Yes   CCM required this visit?  No   Plan:    I have personally reviewed and noted the following in the patient's chart:   Medical and social history Use of alcohol, tobacco or illicit drugs  Current medications and supplements including opioid prescriptions. Patient is not currently taking opioid prescriptions. Functional ability and status Nutritional status Physical activity Advanced directives List of other physicians Hospitalizations, surgeries, and ER visits in previous 12 months Vitals Screenings to include cognitive, depression, and  falls Referrals and appointments  In addition, I have reviewed and discussed with patient certain preventive protocols, quality metrics, and best practice recommendations. A written personalized care plan for preventive services as well as general preventive health recommendations were provided to patient.   Ellouise VEAR Haws, LPN   0/81/[id number]   After Visit Summary: (MyChart) Due to this being a telephonic visit, the after visit summary with patients personalized plan was offered to patient via MyChart   Notes: Nothing significant to report at this time.

## 2024-01-15 NOTE — Patient Instructions (Signed)
 Ms. Kristina Coleman,  Thank you for taking the time for your Medicare Wellness Visit. I appreciate your continued commitment to your health goals. Please review the care plan we discussed, and feel free to reach out if I can assist you further.  Medicare recommends these wellness visits once per year to help you and your care team stay ahead of potential health issues. These visits are designed to focus on prevention, allowing your provider to concentrate on managing your acute and chronic conditions during your regular appointments.  Please note that Annual Wellness Visits do not include a physical exam. Some assessments may be limited, especially if the visit was conducted virtually. If needed, we may recommend a separate in-person follow-up with your provider.  Ongoing Care Seeing your primary care provider every 3 to 6 months helps us  monitor your health and provide consistent, personalized care.   Referrals If a referral was made during today's visit and you haven't received any updates within two weeks, please contact the referred provider directly to check on the status.  Recommended Screenings:  Health Maintenance  Topic Date Due   Hepatitis C Screening  Never done   DTaP/Tdap/Td vaccine (1 - Tdap) Never done   Hepatitis B Vaccine (1 of 3 - 19+ 3-dose series) Never done   Medicare Annual Wellness Visit  01/07/2024   Breast Cancer Screening  05/01/2025   Colon Cancer Screening  03/13/2026   Pap with HPV screening  12/26/2027   HIV Screening  Completed   Pneumococcal Vaccine  Aged Out   HPV Vaccine  Aged Out   Meningitis B Vaccine  Aged Out   Flu Shot  Discontinued   COVID-19 Vaccine  Discontinued       07/29/2023    2:33 PM  Advanced Directives  Does Patient Have a Medical Advance Directive? No   Advance Care Planning is important because it: Ensures you receive medical care that aligns with your values, goals, and preferences. Provides guidance to your family and loved ones,  reducing the emotional burden of decision-making during critical moments.  Vision: Annual vision screenings are recommended for early detection of glaucoma, cataracts, and diabetic retinopathy. These exams can also reveal signs of chronic conditions such as diabetes and high blood pressure.  Dental: Annual dental screenings help detect early signs of oral cancer, gum disease, and other conditions linked to overall health, including heart disease and diabetes.  Please see the attached documents for additional preventive care recommendations.

## 2024-01-16 ENCOUNTER — Telehealth: Payer: Self-pay

## 2024-01-16 NOTE — Progress Notes (Signed)
 Complex Care Management Note  Care Guide Note 01/16/2024 Name: Kristina Coleman MRN: [id number] DOB: [birth year]  [name] is a 48 y.o. year old female who sees Lucius Krabbe, NP for primary care. I reached out to [name] by phone today to offer complex care management services.  [name] was given information about Complex Care Management services today including:   The Complex Care Management services include support from the care team which includes your Nurse Care Manager, Clinical Social Worker, or Pharmacist.  The Complex Care Management team is here to help remove barriers to the health concerns and goals most important to you. Complex Care Management services are voluntary, and the patient may decline or stop services at any time by request to their care team member.   Complex Care Management Consent Status: Patient agreed to services and verbal consent obtained.   Follow up plan:  Telephone appointment with complex care management team member scheduled for:  01/28/2024  Encounter Outcome:  Patient Scheduled  Jeoffrey Buffalo , RMA     [place]  [facility], [facility] Guide  Direct Dial: [phone]  Website: [email or web address]

## 2024-01-19 ENCOUNTER — Ambulatory Visit (INDEPENDENT_AMBULATORY_CARE_PROVIDER_SITE_OTHER): Admitting: Family

## 2024-01-19 ENCOUNTER — Encounter: Payer: Self-pay | Admitting: Family

## 2024-01-19 VITALS — BP 115/80 | HR 77 | Temp 97.9°F | Ht 66.0 in | Wt 248.2 lb

## 2024-01-19 DIAGNOSIS — G40001 Localization-related (focal) (partial) idiopathic epilepsy and epileptic syndromes with seizures of localized onset, not intractable, with status epilepticus: Secondary | ICD-10-CM | POA: Diagnosis not present

## 2024-01-19 DIAGNOSIS — Z1159 Encounter for screening for other viral diseases: Secondary | ICD-10-CM

## 2024-01-19 DIAGNOSIS — Z0000 Encounter for general adult medical examination without abnormal findings: Secondary | ICD-10-CM | POA: Diagnosis not present

## 2024-01-19 LAB — COMPREHENSIVE METABOLIC PANEL WITH GFR
ALT: 21 U/L (ref 0–35)
AST: 14 U/L (ref 0–37)
Albumin: 4.2 g/dL (ref 3.5–5.2)
Alkaline Phosphatase: 69 U/L (ref 39–117)
BUN: 12 mg/dL (ref 6–23)
CO2: 26 meq/L (ref 19–32)
Calcium: 9.2 mg/dL (ref 8.4–10.5)
Chloride: 104 meq/L (ref 96–112)
Creatinine, Ser: 0.89 mg/dL (ref 0.40–1.20)
GFR: 77.49 mL/min (ref >60.00)
Glucose, Bld: 86 mg/dL (ref 70–99)
Potassium: 4 meq/L (ref 3.5–5.1)
Sodium: 137 meq/L (ref 135–145)
Total Bilirubin: 0.3 mg/dL (ref 0.2–1.2)
Total Protein: 6.8 g/dL (ref 6.0–8.3)

## 2024-01-19 LAB — LIPID PANEL
Cholesterol: 178 mg/dL (ref 0–200)
HDL: 59.1 mg/dL (ref >39.00)
LDL Cholesterol: 103 mg/dL — ABNORMAL HIGH (ref 0–99)
NonHDL: 118.7
Total CHOL/HDL Ratio: 3
Triglycerides: 81 mg/dL (ref 0.0–149.0)
VLDL: 16.2 mg/dL (ref 0.0–40.0)

## 2024-01-19 LAB — CBC WITH DIFFERENTIAL/PLATELET
Basophils Absolute: 0 K/uL (ref 0.0–0.1)
Basophils Relative: 0.6 % (ref 0.0–3.0)
Eosinophils Absolute: 0 K/uL (ref 0.0–0.7)
Eosinophils Relative: 1.4 % (ref 0.0–5.0)
HCT: 40 % (ref 36.0–46.0)
Hemoglobin: 13.4 g/dL (ref 12.0–15.0)
Lymphocytes Relative: 37.4 % (ref 12.0–46.0)
Lymphs Abs: 1.3 K/uL (ref 0.7–4.0)
MCHC: 33.6 g/dL (ref 30.0–36.0)
MCV: 91.7 fl (ref 78.0–100.0)
Monocytes Absolute: 0.3 K/uL (ref 0.1–1.0)
Monocytes Relative: 8.4 % (ref 3.0–12.0)
Neutro Abs: 1.9 K/uL (ref 1.4–7.7)
Neutrophils Relative %: 52.2 % (ref 43.0–77.0)
Platelets: 233 K/uL (ref 150.0–400.0)
RBC: 4.36 Mil/uL (ref 3.87–5.11)
RDW: 12.8 % (ref 11.5–15.5)
WBC: 3.6 K/uL — ABNORMAL LOW (ref 4.0–10.5)

## 2024-01-19 LAB — TSH: TSH: 1.53 u[IU]/mL (ref 0.35–5.50)

## 2024-01-19 NOTE — Progress Notes (Signed)
 Phone [phone]  Subjective:   Patient is a 48 y.o. female presenting for annual physical.    Chief Complaint  Patient presents with   Annual Exam    Non fasting w/ labs  Discussed the use of AI scribe software for clinical note transcription with the patient, who gave verbal consent to proceed.  History of Present Illness   Kristina Coleman is a 48 year old female who presents for a regular physical exam and lab work.  She experiences occasional queasiness, particularly during her menstrual cycle. She is currently on Lamictal  and Zonegran  for seizures, with no changes in dosage. Episodes of lightheadedness occur when standing up quickly, without vertigo. She does not consume coffee and has increased her tea intake.  She engages in regular physical activity, attending the gym three to five times a week, focusing on group workouts, nutrition, and healthy cooking. A recent illness two months ago disrupted her routine, but she is resuming her activities.      See problem oriented charting- ROS- full  review of systems was completed and negative except for what is noted in HPI above.  The following were reviewed and entered/updated in epic: Past Medical History:  Diagnosis Date   Anxiety    Arthritis    History of chicken pox    Idiopathic intracranial hypertension 12/27/2015   Lumbar Puncture: opening pressure 26cm of water. 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 Evelia ([facility])   Intracranial hypertension    Morbid obesity (HCC)    Recurrent seizures (HCC) 05/02/2018   Seizures (HCC) 11/2015   Tobacco use 12/24/2016   Patient Active Problem List   Diagnosis Date Noted   Vitamin D  deficiency 04/03/2021   Chronic pain of both knees 02/27/2021   Primary osteoarthritis of both knees 02/27/2021   Adjustment disorder with mixed anxiety and depressed mood  07/09/2017   Morbid obesity (HCC)    Partial idiopathic epilepsy with seizures of localized onset, not intractable, with status epilepticus (HCC) 11/28/2015   Past Surgical History:  Procedure Laterality Date   BREAST BIOPSY     CHOLECYSTECTOMY     left knee surgery     LUMBAR PUNCTURE     fluid removal in brain.    wisdom teeth removal      Family History  Problem Relation Age of Onset   Diabetes Mother    Hypertension Mother    Arthritis Mother    Diabetes Father    Hypertension Father    Glaucoma Father    Cataracts Father    Cancer Sister        breast cancer   Breast cancer Sister 86   Colon cancer Neg Hx    Colon polyps Neg Hx    Esophageal cancer Neg Hx    Rectal cancer Neg Hx    Stomach cancer Neg Hx     Medications- reviewed and updated Current Outpatient Medications  Medication Sig Dispense Refill   lamoTRIgine  (LAMICTAL ) 100 MG tablet TAKE 3 TABLETS EVERY MORNING and TAKE 2 & 1/2 TABLETS AT NIGHT 500 tablet 3   zonisamide  (ZONEGRAN ) 100 MG capsule TAKE 2 CAPSULES BY MOUTH EVERY EVENING 180 capsule 1   diclofenac  Sodium (VOLTAREN ) 1 % GEL Apply 4 g topically 4 (four) times daily. To affected joint. (Patient not taking: Reported on 01/19/2024) 100 g 11   No current facility-administered medications for this visit.  Allergies-reviewed and updated No Known Allergies  Social History   Social History Narrative   Occasionally drinks tea    Are you right handed or left handed? Right handed    Are you currently employed ?  Disability    What is your current occupation? NA   Do you live at home alone? No   Who lives with you? Lives with girl friend   What type of home do you live in: 1 story or 2 story? Has steps  spilt level home        Objective:  BP 115/80 (BP Location: Left Arm, Patient Position: Sitting, Cuff Size: Large)   Pulse 77   Temp 97.9 F (36.6 C) (Temporal)   Ht 5' 6 (1.676 m)   Wt 248 lb 3.2 oz (112.6 kg)   LMP 12/31/2023  (Approximate)   SpO2 98%   BMI 40.06 kg/m  Physical Exam Vitals and nursing note reviewed.  Constitutional:      Appearance: Normal appearance. She is obese.  HENT:     Head: Normocephalic.     Right Ear: Tympanic membrane normal.     Left Ear: Tympanic membrane normal.     Nose: Nose normal.     Mouth/Throat:     Mouth: Mucous membranes are moist.  Eyes:     Pupils: Pupils are equal, round, and reactive to light.  Cardiovascular:     Rate and Rhythm: Normal rate and regular rhythm.  Pulmonary:     Effort: Pulmonary effort is normal.     Breath sounds: Normal breath sounds.  Musculoskeletal:        General: Normal range of motion.     Cervical back: Normal range of motion.  Lymphadenopathy:     Cervical: No cervical adenopathy.  Skin:    General: Skin is warm and dry.  Neurological:     Mental Status: She is alert.  Psychiatric:        Mood and Affect: Mood normal.        Behavior: Behavior normal.      Assessment and Plan   Health Maintenance counseling: 1. Anticipatory guidance: Patient counseled regarding regular dental exams q6 months, eye exams,  avoiding smoking and second hand smoke, limiting alcohol to 1 beverage per day, no illicit drugs.   2. Risk factor reduction:  Advised patient of need for regular exercise and diet rich with fruits and vegetables to reduce risk of heart attack and stroke.  Wt Readings from Last 3 Encounters:  01/19/24 248 lb 3.2 oz (112.6 kg)  01/15/24 250 lb (113.4 kg)  09/14/23 250 lb (113.4 kg)   3. Immunizations/screenings/ancillary studies  There is no immunization history on file for this patient. There are no preventive care reminders to display for this patient.   4. Cervical cancer screening-  done 11/2022, normal 5. Breast cancer screening-  mammogram done 04/2023 6. Colon cancer screening - done 02/2023 7. Skin cancer screening- advised regular sunscreen use. Denies worrisome, changing, or new skin lesions.  8. Birth  control/STD check- none/N/A 9. Osteoporosis screening- N/A 10. Alcohol screening: none 11. Smoking associated screening (lung cancer screening, AAA screen 65-75, UA)- ex- smoker- ppd 12. [facility]  Assessment and Plan    Adult Wellness Visit Routine wellness visit. Up to date on screenings. Regular physical activity. Interested in nutrition and healthy cooking. - Order cholesterol panel. - Order comprehensive metabolic panel. - Order complete blood count. - Order hepatitis C  screening. - Provided preventative health summary.  Morbid Obesity  Working on eating a healthier diet. Attending P.R.E.P program at [facility] which including nutrition teaching. Continuing to exercise.    Recommended follow up:  Return for any future concerns, Complete physical w/fasting labs. Future Appointments  Date Time Provider Department Center  01/28/2024 11:00 AM Kit Alm LABOR, LCSW [facility] None  03/04/2024  8:30 AM Georjean Darice HERO, MD [facility] None  01/18/2025 10:00 AM [facility] ANNUAL WELLNESS VISIT 1 [facility] Kristina Coleman   Lab/Order associations: fasting   Lucius Krabbe, NP

## 2024-01-19 NOTE — Progress Notes (Deleted)
   Patient ID: Kristina Coleman, female    DOB: 05/01/1975, 48 y.o.   MRN: [id number]  No chief complaint on file.        Subjective:    Outpatient Medications Prior to Visit  Medication Sig Dispense Refill   diclofenac  Sodium (VOLTAREN ) 1 % GEL Apply 4 g topically 4 (four) times daily. To affected joint. 100 g 11   lamoTRIgine  (LAMICTAL ) 100 MG tablet TAKE 3 TABLETS EVERY MORNING and TAKE 2 & 1/2 TABLETS AT NIGHT 500 tablet 3   zonisamide  (ZONEGRAN ) 100 MG capsule TAKE 2 CAPSULES BY MOUTH EVERY EVENING 180 capsule 1   No facility-administered medications prior to visit.   Past Medical History:  Diagnosis Date   Anxiety    Arthritis    History of chicken pox    Idiopathic intracranial hypertension 12/27/2015   Lumbar Puncture: opening pressure 26cm of water. 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 Evelia ([facility])   Intracranial hypertension    Morbid obesity (HCC)    Recurrent seizures (HCC) 05/02/2018   Seizures (HCC) 11/2015   Tobacco use 12/24/2016   Past Surgical History:  Procedure Laterality Date   BREAST BIOPSY     CHOLECYSTECTOMY     left knee surgery     LUMBAR PUNCTURE     fluid removal in brain.    wisdom teeth removal     No Known Allergies    Objective:    Physical Exam Vitals and nursing note reviewed.  Constitutional:      Appearance: Normal appearance. She is obese.  Cardiovascular:     Rate and Rhythm: Normal rate and regular rhythm.  Pulmonary:     Effort: Pulmonary effort is normal.     Breath sounds: Normal breath sounds.  Musculoskeletal:        General: Normal range of motion.  Skin:    General: Skin is warm and dry.  Neurological:     Mental Status: She is alert.  Psychiatric:        Mood and Affect: Mood normal.        Behavior: Behavior normal.    There were no vitals taken for this visit. Wt Readings from Last 3  Encounters:  01/15/24 250 lb (113.4 kg)  09/14/23 250 lb (113.4 kg)  07/29/23 252 lb 3.2 oz (114.4 kg)       Lucius Krabbe, NP

## 2024-01-19 NOTE — Patient Instructions (Addendum)
 It was very nice to see you today!   I will review your lab results via MyChart in a few days.  You look great! Stay well! Keep exercising!     PLEASE NOTE:  If you had any lab tests please let us  know if you have not heard back within a few days. You may see your results on MyChart before we have a chance to review them but we will give you a call once they are reviewed by us . If we ordered any referrals today, please let us  know if you have not heard from their office within the next week.

## 2024-01-21 ENCOUNTER — Ambulatory Visit: Payer: Self-pay | Admitting: Family

## 2024-01-21 LAB — HEPATITIS C ANTIBODY: Hepatitis C Ab: NONREACTIVE

## 2024-01-27 ENCOUNTER — Other Ambulatory Visit: Payer: Self-pay | Admitting: Neurology

## 2024-01-27 DIAGNOSIS — G40009 Localization-related (focal) (partial) idiopathic epilepsy and epileptic syndromes with seizures of localized onset, not intractable, without status epilepticus: Secondary | ICD-10-CM

## 2024-01-28 ENCOUNTER — Other Ambulatory Visit: Payer: Self-pay | Admitting: Licensed Clinical Social Worker

## 2024-01-28 NOTE — Patient Outreach (Signed)
 Complex Care Management   Visit Note  01/28/2024  Name:  Kristina Coleman MRN: [id number] DOB: 10-26-1975  Situation: Referral received for Complex Care Management related to Mental/Behavioral Health diagnosis MDD I obtained verbal consent from Patient.  Visit completed with Patient  on the phone  Background:   Past Medical History:  Diagnosis Date   Anxiety    Arthritis    History of chicken pox    Idiopathic intracranial hypertension 12/27/2015   Lumbar Puncture: opening pressure 26cm of water. 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 Evelia ([facility])   Intracranial hypertension    Morbid obesity (HCC)    Recurrent seizures (HCC) 05/02/2018   Seizures (HCC) 11/2015   Tobacco use 12/24/2016    Assessment: Patient Reported Symptoms:  Cognitive Cognitive Status: Alert and oriented to person, place, and time, Normal speech and language skills, Insightful and able to interpret abstract concepts Cognitive/Intellectual Conditions Management [RPT]: None reported or documented in medical history or problem list   Health Maintenance Behaviors: None, Stress management Healing Pattern: Unsure Health Facilitated by: Stress management  Neurological Neurological Review of Symptoms: No symptoms reported    HEENT HEENT Symptoms Reported: No symptoms reported      Cardiovascular Cardiovascular Symptoms Reported: No symptoms reported    Respiratory Respiratory Symptoms Reported: No symptoms reported    Endocrine Endocrine Symptoms Reported: No symptoms reported    Gastrointestinal Gastrointestinal Symptoms Reported: No symptoms reported      Genitourinary Genitourinary Symptoms Reported: No symptoms reported    Integumentary Integumentary Symptoms Reported: No symptoms reported    Musculoskeletal Musculoskelatal Symptoms Reviewed: No symptoms reported         Psychosocial Psychosocial Symptoms Reported: Depression - if selected complete PHQ 2-9 Behavioral Management Strategies: Coping strategies, Counseling Behavioral Health Self-Management Outcome: 3 (uncertain) Major Change/Loss/Stressor/Fears (CP): Medical condition, self Techniques to Cope with Loss/Stress/Change: Counseling, Medication Quality of Family Relationships: helpful, involved Do you feel physically threatened by others?: No    01/28/2024    PHQ2-9 Depression Screening   Little interest or pleasure in doing things    Feeling down, depressed, or hopeless    PHQ-2 - Total Score    Trouble falling or staying asleep, or sleeping too much    Feeling tired or having little energy    Poor appetite or overeating     Feeling bad about yourself - or that you are a failure or have let yourself or your family down    Trouble concentrating on things, such as reading the newspaper or watching television    Moving or speaking so slowly that other people could have noticed.  Or the opposite - being so fidgety or restless that you have been moving around a lot more than usual    Thoughts that you would be better off dead, or hurting yourself in some way    PHQ2-9 Total Score    If you checked off any problems, how difficult have these problems made it for you to do your work, take care of things at home, or get along with other people    Depression Interventions/Treatment      There were no vitals filed for this visit.  Medications Reviewed Today     Reviewed by Kit Alm LABOR, LCSW (Social Worker) on 01/28/24 at 1057  Med List Status: <None>   Medication Order Taking? Sig Documenting Provider Last Dose Status Informant  diclofenac  Sodium (VOLTAREN ) 1 % GEL [id number]  Apply 4 g topically 4 (four) times daily. To affected joint.  Patient not taking: Reported on 01/19/2024   Joane Artist RAMAN, MD  Active   lamoTRIgine  (LAMICTAL ) 100 MG tablet [id number] Yes TAKE 3 TABLETS EVERY MORNING and TAKE  2 & 1/2 TABLETS AT ADA Georjean Darice CHRISTELLA, MD  Active   zonisamide  (ZONEGRAN ) 100 MG capsule [id number] Yes TAKE 2 CAPSULES BY MOUTH EVERY EVENING Georjean Darice CHRISTELLA, MD  Active   Med List Note Cliffton Fermin Sari JAYSON Bishop 05/19/13 1838): No pharmacy.             Recommendation:   Continue Current Plan of Care  Follow Up Plan:   Patient has met all care management goals. Care Management case will be closed. Patient has been provided contact information should new needs arise.   Alm Armor, LCSW [facility], [facility] Licensed Clinical Social Worker Care Coordinator [phone]

## 2024-01-30 ENCOUNTER — Encounter: Payer: Self-pay | Admitting: Family

## 2024-01-30 ENCOUNTER — Ambulatory Visit (INDEPENDENT_AMBULATORY_CARE_PROVIDER_SITE_OTHER): Admitting: Family

## 2024-01-30 VITALS — BP 136/84 | HR 78 | Temp 98.1°F | Ht 66.0 in | Wt 243.5 lb

## 2024-01-30 DIAGNOSIS — R0989 Other specified symptoms and signs involving the circulatory and respiratory systems: Secondary | ICD-10-CM

## 2024-01-30 NOTE — Progress Notes (Signed)
 Patient ID: Kristina Coleman, female    DOB: October 25, 1975, 48 y.o.   MRN: [id number]  Chief Complaint  Patient presents with   Sore Throat    Pt c/o mucus with brown in her throat, present after she stopped smoking 1 year ago.   Discussed the use of AI scribe software for clinical note transcription with the patient, who gave verbal consent to proceed.  History of Present Illness   Kristina Coleman is a 48 year old female who presents with brown mucus production from the throat.  She experiences intermittent production of clear mucus with brown dots, possibly old blood, from her throat. This occurs sporadically and not with every cough, accumulating in her throat and prompting her to spit it out. She expected this symptom to resolve after quitting smoking.  She smoked for five to ten years, though not heavily, less than 1ppd and quit about a year ago. She is occasionally exposed to secondhand smoke from her girlfriend, who smokes, but she minimizes exposure by rolling down the car window or staying away when her girlfriend smokes outside. She denies being exposed growing up to a lot of second hand smoke.  She denies any chest tightness, pain, or trouble breathing.     Assessment and Plan    Chronic chest mucus with occasional brown specks Chronic chest mucus with occasional brown specks, likely due to old blood from past lung infections. Low lung cancer risk due to limited smoking history and no significant secondhand smoke exposure. Differential includes residual effects from past cough or viral infection. Lungs clear on exam. - Monitor for worsening symptoms such as increased mucus or larger brown specks. - Report new symptoms such as chest tightness/pain or persistent dyspnea. - Avoid tobacco smoke exposure as much as possible.    Subjective:    Outpatient Medications Prior to Visit  Medication Sig Dispense Refill   lamoTRIgine  (LAMICTAL ) 100 MG tablet TAKE 3 TABLETS EVERY MORNING and TAKE  2 & 1/2 TABLETS AT NIGHT 500 tablet 0   zonisamide  (ZONEGRAN ) 100 MG capsule Take 3 capsules (300 mg total) by mouth at bedtime. TAKE 3 CAPSULES BY MOUTH EVERY EVENING 270 capsule 0   diclofenac  Sodium (VOLTAREN ) 1 % GEL Apply 4 g topically 4 (four) times daily. To affected joint. (Patient not taking: Reported on 01/30/2024) 100 g 11   No facility-administered medications prior to visit.   Past Medical History:  Diagnosis Date   Anxiety    Arthritis    History of chicken pox    Idiopathic intracranial hypertension 12/27/2015   Lumbar Puncture: opening pressure 26cm of water. 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 Evelia ([facility])   Intracranial hypertension    Morbid obesity (HCC)    Recurrent seizures (HCC) 05/02/2018   Seizures (HCC) 11/2015   Tobacco use 12/24/2016   Past Surgical History:  Procedure Laterality Date   BREAST BIOPSY     CHOLECYSTECTOMY     left knee surgery     LUMBAR PUNCTURE     fluid removal in brain.    wisdom teeth removal     No Known Allergies    Objective:    Physical Exam Vitals and nursing note reviewed.  Constitutional:      Appearance: Normal appearance. She is obese.  Cardiovascular:     Rate and Rhythm: Normal rate and regular rhythm.  Pulmonary:  Effort: Pulmonary effort is normal.     Breath sounds: Normal breath sounds.  Musculoskeletal:        General: Normal range of motion.  Skin:    General: Skin is warm and dry.  Neurological:     Mental Status: She is alert.  Psychiatric:        Mood and Affect: Mood normal.        Behavior: Behavior normal.    BP 136/84 (BP Location: Left Arm, Patient Position: Sitting, Cuff Size: Large)   Pulse 78   Temp 98.1 F (36.7 C) (Temporal)   Ht 5' 6 (1.676 m)   Wt 243 lb 8 oz (110.5 kg)   LMP 12/31/2023 (Approximate)   SpO2 98%   BMI 39.30 kg/m  Wt Readings from  Last 3 Encounters:  01/30/24 243 lb 8 oz (110.5 kg)  01/19/24 248 lb 3.2 oz (112.6 kg)  01/15/24 250 lb (113.4 kg)      Lucius Krabbe, NP

## 2024-02-17 ENCOUNTER — Encounter: Payer: Self-pay | Admitting: Neurology

## 2024-02-17 ENCOUNTER — Ambulatory Visit (INDEPENDENT_AMBULATORY_CARE_PROVIDER_SITE_OTHER): Admitting: Neurology

## 2024-02-17 VITALS — BP 130/75 | HR 75 | Ht 66.0 in | Wt 245.0 lb

## 2024-02-17 DIAGNOSIS — G40009 Localization-related (focal) (partial) idiopathic epilepsy and epileptic syndromes with seizures of localized onset, not intractable, without status epilepticus: Secondary | ICD-10-CM

## 2024-02-17 DIAGNOSIS — R200 Anesthesia of skin: Secondary | ICD-10-CM

## 2024-02-17 MED ORDER — LAMOTRIGINE 100 MG PO TABS: ORAL_TABLET | ORAL | 3 refills | Status: AC

## 2024-02-17 MED ORDER — ZONISAMIDE 100 MG PO CAPS: ORAL_CAPSULE | ORAL | 3 refills | Status: AC

## 2024-02-17 NOTE — Progress Notes (Signed)
 NEUROLOGY FOLLOW UP OFFICE NOTE  Kristina Coleman [id number] 1975-10-16  HISTORY OF PRESENT ILLNESS: I had the pleasure of seeing Kristina Coleman in follow-up in the neurology clinic on 02/17/2024.  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 She is alone in the office today. Records and images were personally reviewed where available.  Since her last visit, she denies any convulsions since 01/21/23 (nocturnal seizure). She has mild focal aware seizures around the time of her period where she has the queasy feeling with headache, no loss of awareness. She denies any staring/unresponsive episodes, gaps in time, olfactory/gustatory hallucinations, myoclonic jerks. She is on Zonisamide  200mg  at bedtime (100mg : 2 caps at bedtime) and Lamotrigine  300mg  in AM, 250mg  in PM (177m: 3 tabs in AM, 2.5 tabs in PM), no side effects. Separate from the seizures, she feels headaches are from vision issues, she sees her eye doctor next month. She has occasional floaters. She has noticed occasional numbness in the right forearm. No neck pain. No falls, she is working on her balance. She gets 6-7 hours of sleep. Mood depends. She lives with her girlfriend. She is driving.   History on Initial Assessment 03/13/2022: This is a pleasant 48 year old right-handed woman with a history of left temporal lobe epilepsy presenting to establish care. She has seen neurologists Dr. Ines, Dr. Janit, and since 2019 has been following at the [facility] with Dr. Evelia, last visit was 09/2020. Records were reviewed and will be summarized as follows. Seizures started at age 62. She thinks she may have had 1 or 2 seizures in her early teenage years when she had episodes in her sleep/dreams. At age 48, she started having nocturnal episodes where she would make a noise in her throat followed by generalized shaking, possible turn to the right. She would wake up feeling groggy, sore, with tongue bite or urinary  incontinence. She would sleep for 2 days. They were occurring more around the time of her period. She had rare staring episodes where she may look to the right, sometimes partially responding, other times unresponsive. She denies any prior warning symptoms. One time her sister told her she just stopped talking, froze with one hand in the air, unresponsive for a few seconds then she started talking again. Another time she paused and felt tired. Per notes, these would progress to bilateral arm jerking, foaming at the mouth and urinary incontinence. She reports 3 of these total, last was in 06/2017. She also has what she calls mini-seizures where she would get a very queasy feeling in her stomach, a headache with dizziness, cold sweats. She denies any associated confusion, loss of awareness, or speech difficulties. They would last a few minutes then she feels tired. She denies any olfactory/gustatory hallucinations, focal numbness/tingling/weakness, myoclonic jerks. She was initially started on Levetiracetam  which caused headaches and mood swings. She has been on Lamotrigine  300mg  in AM, 250mg  in PM and Zonisamide  200mg  qhs since at least 2021 and feels this regimen works for her, no side effects. She reports her last nocturnal seizure was in 2022. The episodes with queasy stomach feeling have occurred a few times this year, last was in October 2023 while she was at the [facility] and there was a lot of activity going on. She reports triggers are when she is doing too much (cannot do more than 1 thing at a time), may be her period, sleep deprivation, or if she does not eat regularly.  She denies any regular headaches outside of the seizures. Her vision gets blurry, no loss of vision. 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 She had a lumbar puncture with OP of 26 cm H2O. She will be seeing her eye  doctor. No dysarthria/dysphagia, neck/back pain, bladder dysfunction. She has some constipation. She does not have good memory, forgetting conversations or anything prior to when she started having seizures. She lives with her girlfriend. She drives without getting lost. She left the stove on one time. She has to set an alarm for her medications. Mood is pretty good. Sometimes she gets frustrated when she cannot work, she is on disability. Sleep is good. She had a sleep study in 2022 which was normal.   Epilepsy Risk Factors:  She has 3 maternal cousins with seizures. She had a normal birth and early development.  There is no history of febrile convulsions, CNS infections such as meningitis/encephalitis, significant traumatic brain injury, neurosurgical procedures.  Prior ASMs: Zonisamide  400mg  (unable to tolerate); Levetiracetam  (headaches, mood swings)  Diagnostic Data: EEGs: Routine EEG (05/13/17): normal awake and asleep VEEG at [facility] (4/13-4/14/20): rare left temporal slowing and sharp waves; one seizure with blinking, R hand automatisms, then secondary generalization from the left temporal lobe  MRI: MRI brain wo contrast at [facility] (05/13/17): no acute abnormality; hippocampi symmetric. 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 Constellation raises concern for idiopathic intracranial hypertension.   Lumbar Puncture at [facility] in 04/2017 showed opening pressure of 26 cm H2O. Fundoscopy at [facility] in 10/2017 was normal, no papilledema seen.  Sleep study 08/2020 normal  PAST MEDICAL HISTORY: Past Medical History:  Diagnosis Date   Anxiety    Arthritis    History of chicken pox    Idiopathic intracranial hypertension  12/27/2015   Lumbar Puncture: opening pressure 26cm of water. EEG done 05/13/2017: normal MRI done 05/13/2017: IIH with distension of optic nerve sheath, partially empty cell Managed by Dr.Ahern ROMAINE) and Dr. Evelia ([facility])   Intracranial hypertension    Morbid obesity (HCC)    Recurrent seizures (HCC) 05/02/2018   Seizures (HCC) 11/2015   Tobacco use 12/24/2016    MEDICATIONS: Current Outpatient Medications on File Prior to Visit  Medication Sig Dispense Refill   diclofenac  Sodium (VOLTAREN ) 1 % GEL Apply 4 g topically 4 (four) times daily. To affected joint. 100 g 11   lamoTRIgine  (LAMICTAL ) 100 MG tablet TAKE 3 TABLETS EVERY MORNING and TAKE 2 & 1/2 TABLETS AT NIGHT 500 tablet 0   zonisamide  (ZONEGRAN ) 100 MG capsule Take 3 capsules (300 mg total) by mouth at bedtime. TAKE 3 CAPSULES BY MOUTH EVERY EVENING (Patient taking differently: Take 300 mg by mouth at bedtime. TAKE 2 CAPSULES BY MOUTH EVERY EVENING) 270 capsule 0   No current facility-administered medications on file prior to visit.    ALLERGIES: No Known Allergies  FAMILY HISTORY: Family History  Problem Relation Age of Onset   Diabetes Mother    Hypertension Mother    Arthritis Mother    Diabetes Father    Hypertension Father    Glaucoma Father    Cataracts Father  Cancer Sister        breast cancer   Breast cancer Sister 75   Colon cancer Neg Hx    Colon polyps Neg Hx    Esophageal cancer Neg Hx    Rectal cancer Neg Hx    Stomach cancer Neg Hx     SOCIAL HISTORY: Social History   Socioeconomic History   Marital status: Significant Other    Spouse name: Not on file   Number of children: 0   Years of education: college   Highest education level: Associate degree: occupational, Scientist, product/process development, or vocational program  Occupational History   Occupation: unemployed  Tobacco Use   Smoking status: Former    Current packs/day: 0.25    Types: Cigarettes   Smokeless tobacco: Never  Vaping Use    Vaping status: Never Used  Substance and Sexual Activity   Alcohol use: Not Currently    Comment: on occasion   Drug use: Yes    Frequency: 14.0 times per week    Types: Marijuana    Comment: every day, last time 03/12/23   Sexual activity: Yes    Birth control/protection: None  Other Topics Concern   Not on file  Social History Narrative   Occasionally drinks tea    Are you right handed or left handed? Right handed    Are you currently employed ?  Disability    What is your current occupation? NA   Do you live at home alone? No   Who lives with you? Lives with girl friend   What type of home do you live in: 1 story or 2 story? Has steps  spilt level home       Social Drivers of Health   Financial Resource Strain: Medium Risk (01/14/2024)   Overall Financial Resource Strain (CARDIA)    Difficulty of Paying Living Expenses: Somewhat hard  Food Insecurity: Food Insecurity Present (01/28/2024)   Hunger Vital Sign    Worried About Running Out of Food in the Last Year: Never true    Ran Out of Food in the Last Year: Sometimes true  Transportation Needs: No Transportation Needs (01/28/2024)   PRAPARE - Administrator, Civil Service (Medical): No    Lack of Transportation (Non-Medical): No  Physical Activity: Sufficiently Active (01/14/2024)   Exercise Vital Sign    Days of Exercise per Week: 5 days    Minutes of Exercise per Session: 30 min  Stress: No Stress Concern Present (01/14/2024)   [employer] of Occupational Health - Occupational Stress Questionnaire    Feeling of Stress: Only a little  Social Connections: Moderately Isolated (01/14/2024)   Social Connection and Isolation Panel    Frequency of Communication with Friends and Family: Three times a week    Frequency of Social Gatherings with Friends and Family: Patient declined    Attends Religious Services: Patient declined    Database administrator or Organizations: No    Attends Hospital doctor: Not on file    Marital Status: Living with partner  Intimate Partner Violence: Not At Risk (01/28/2024)   Humiliation, Afraid, Rape, and Kick questionnaire    Fear of Current or Ex-Partner: No    Emotionally Abused: No    Physically Abused: No    Sexually Abused: No     PHYSICAL EXAM: Vitals:   02/17/24 1353  BP: 130/75  Pulse: 75  SpO2: 99%   General: No acute distress Head:  Normocephalic/atraumatic Skin/Extremities: No rash,  no edema Neurological Exam: alert and awake. No aphasia or dysarthria. Fund of knowledge is appropriate.    Attention and concentration are normal. Cranial nerves: Pupils equal, round. Extraocular movements intact with no nystagmus. Visual fields full.  No facial asymmetry.  Motor: Bulk and tone normal, muscle strength 5/5 throughout with no pronator drift. Sensation intact to temperature. Reflexes +1 throughout.   Finger to nose testing intact.  Gait narrow-based and steady, able to tandem walk adequately.  Romberg negative. Negative Tinel sign at elbow and wrist   IMPRESSION: This is a pleasant 48 yo RH woman with a history of left temporal lobe epilepsy. Her last bigger seizure was 12/2022, she reports mild episodes around her period. Continue Zonisamide  200mg  at bedtime and Lamotrigine  300mg  in AM, 250mg  in PM. 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 She can try using an elbow brace. She is aware of [place] driving laws to stop driving after a seizure until 6 months seizure-free. Follow-up in 6 months, call for any changes.    Thank you for allowing me to participate in her care.  Please do not hesitate to call for any questions or concerns.    Darice Shivers, M.D.   CC: Corean Comment, NP

## 2024-02-17 NOTE — Patient Instructions (Signed)
 Good to see you.  Continue all your seizure medications  2. Monitor the right arm symptoms and if frequent, we can do a nerve test  3. Follow-up in 6 months, call for any changes   Seizure Precautions: 1. If medication has been prescribed for you to prevent seizures, take it exactly as directed.  Do not stop taking the medicine without talking to your doctor first, even if you have not had a seizure in a long time.   2. Avoid activities in which a seizure would cause danger to yourself or to others.  Don't operate dangerous machinery, swim alone, or climb in high or dangerous places, such as on ladders, roofs, or girders.  Do not drive unless your doctor says you may.  3. If you have any warning that you may have a seizure, lay down in a safe place where you can't hurt yourself.    4.  No driving for 6 months from last seizure, as per [place]  state law.   Please refer to the following link on the [employer]'s website for more information: http://www.epilepsyfoundation.org/answerplace/Social/driving/drivingu.cfm   5.  Maintain good sleep hygiene. Avoid alcohol  6.  Contact your doctor if you have any problems that may be related to the medicine you are taking.  7.  Call 911 and bring the patient back to the ED if:        A.  The seizure lasts longer than 5 minutes.       B.  The patient doesn't awaken shortly after the seizure  C.  The patient has new problems such as difficulty seeing, speaking or moving  D.  The patient was injured during the seizure  E.  The patient has a temperature over 102 F (39C)  F.  The patient vomited and now is having trouble breathing

## 2024-03-01 ENCOUNTER — Encounter: Payer: Self-pay | Admitting: Radiology

## 2024-03-01 ENCOUNTER — Encounter: Payer: Self-pay | Admitting: Family

## 2024-03-03 ENCOUNTER — Ambulatory Visit: Admitting: Family

## 2024-03-03 ENCOUNTER — Encounter: Payer: Self-pay | Admitting: Family

## 2024-03-03 LAB — OPHTHALMOLOGY REPORT-SCANNED

## 2024-03-03 LAB — POCT GLYCOSYLATED HEMOGLOBIN (HGB A1C): Hemoglobin A1C: 5 % (ref 4.0–5.6)

## 2024-03-03 NOTE — Progress Notes (Signed)
 Patient ID: Kristina Coleman, female    DOB: Jun 16, 1975, 48 y.o.   MRN: [id number]  Chief Complaint  Patient presents with   Obesity    Pt would like to discuss [facility] wellness program.   Discussed the use of AI scribe software for clinical note transcription with the patient, who gave verbal consent to proceed.  History of Present Illness Kristina Coleman is a 48 year old female who presents for a wellness program consideration and discussion of her blood work results.  She is interested in joining a wellness program that requires participants to be prediabetic or diabetic. She is concerned about her eligibility due to her current health status and family history of diabetes, with her mother likely having diabetes and her sister being prediabetic. She acknowledges her weight as a risk factor. Her dietary habits include reducing white carbohydrates, particularly bread, and she is mindful of her fruit intake, aiming for two to three small servings a day. She prefers making her own smoothies to avoid added sugars. Her blood work shows a history of good A1c levels, with a previous result of 4.6. She notes a slight elevation in her LDL cholesterol.  Assessment & Plan Obesity due to excess calories Previously with morbid obesity, and lost weight working with organized program teaching exercise routines as well as healthy eating. Motivated to lose weight. A1C today 5.0, no prediabetes. - Maintain dietary changes, reducing white carbohydrates, increasing fruit and seafood intake. - Advised to notify wellness program that she has a family hx of diabetes and with her obesity places her at risk.  High LDL Mildly elevated LDL cholesterol. Aware of dietary influences and making changes to address this. - Continue dietary modifications to reduce LDL cholesterol, including reducing intake of French fries and bacon. - Discuss with wellness program interest in help (lifestyle modifications) with lowering her  cholesterol.  General Health Maintenance Aware of potential perimenopausal symptoms, monitoring menstrual cycle, maintaining healthy lifestyle. - Monitor menstrual cycle for perimenopausal changes. - Continue healthy lifestyle practices, including diet and exercise.  Subjective:    Outpatient Medications Prior to Visit  Medication Sig Dispense Refill   diclofenac  Sodium (VOLTAREN ) 1 % GEL Apply 4 g topically 4 (four) times daily. To affected joint. 100 g 11   lamoTRIgine  (LAMICTAL ) 100 MG tablet TAKE 3 TABLETS EVERY MORNING and TAKE 2 & 1/2 TABLETS AT NIGHT 500 tablet 3   zonisamide  (ZONEGRAN ) 100 MG capsule Take 2 capsules every night 180 capsule 3   No facility-administered medications prior to visit.   Past Medical History:  Diagnosis Date   Anxiety    Arthritis    History of chicken pox    Idiopathic intracranial hypertension 12/27/2015   Lumbar Puncture: opening pressure 26cm of water. 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 Evelia ([facility])   Intracranial hypertension    Morbid obesity (HCC)    Recurrent seizures (HCC) 05/02/2018   Seizures (HCC) 11/2015   Tobacco use 12/24/2016   Past Surgical History:  Procedure Laterality Date   BREAST BIOPSY     CHOLECYSTECTOMY     left knee surgery     LUMBAR PUNCTURE     fluid removal in brain.    wisdom teeth removal     No Known Allergies    Objective:    Physical Exam Vitals and nursing note reviewed.  Constitutional:      Appearance:  Normal appearance. She is obese.  Cardiovascular:     Rate and Rhythm: Normal rate and regular rhythm.  Pulmonary:     Effort: Pulmonary effort is normal.     Breath sounds: Normal breath sounds.  Musculoskeletal:        General: Normal range of motion.  Skin:    General: Skin is warm and dry.  Neurological:     Mental Status: She is alert.  Psychiatric:         Mood and Affect: Mood normal.        Behavior: Behavior normal.    BP 130/84 (BP Location: Left Arm, Patient Position: Sitting, Cuff Size: Normal)   Pulse 79   Temp (!) 97 F (36.1 C) (Temporal)   Ht 5' 6 (1.676 m)   Wt 246 lb (111.6 kg)   LMP 02/26/2024 (Approximate)   SpO2 97%   BMI 39.71 kg/m  Wt Readings from Last 3 Encounters:  03/03/24 246 lb (111.6 kg)  02/17/24 245 lb (111.1 kg)  01/30/24 243 lb 8 oz (110.5 kg)      Lucius Krabbe, NP

## 2024-03-04 ENCOUNTER — Ambulatory Visit: Admitting: Neurology

## 2024-03-22 ENCOUNTER — Encounter: Payer: Self-pay | Admitting: Neurology

## 2024-04-24 ENCOUNTER — Encounter: Payer: Self-pay | Admitting: Neurology

## 2024-09-14 ENCOUNTER — Ambulatory Visit: Admitting: Neurology

## 2025-01-18 ENCOUNTER — Ambulatory Visit
